# Patient Record
Sex: Female | Born: 1935 | Race: White | Hispanic: No | Smoking: Never smoker
Health system: Southern US, Community
[De-identification: ages and names within clinical notes are randomized; demographics above are authoritative.]

## PROBLEM LIST (undated history)

## (undated) DIAGNOSIS — T7840XA Allergy, unspecified, initial encounter: Secondary | ICD-10-CM

## (undated) DIAGNOSIS — N3090 Cystitis, unspecified without hematuria: Secondary | ICD-10-CM

## (undated) DIAGNOSIS — C801 Malignant (primary) neoplasm, unspecified: Secondary | ICD-10-CM

## (undated) DIAGNOSIS — R112 Nausea with vomiting, unspecified: Secondary | ICD-10-CM

## (undated) DIAGNOSIS — N189 Chronic kidney disease, unspecified: Secondary | ICD-10-CM

## (undated) DIAGNOSIS — Z9889 Other specified postprocedural states: Secondary | ICD-10-CM

## (undated) DIAGNOSIS — J45909 Unspecified asthma, uncomplicated: Secondary | ICD-10-CM

## (undated) DIAGNOSIS — N6019 Diffuse cystic mastopathy of unspecified breast: Secondary | ICD-10-CM

## (undated) DIAGNOSIS — U071 COVID-19: Secondary | ICD-10-CM

## (undated) DIAGNOSIS — M1990 Unspecified osteoarthritis, unspecified site: Secondary | ICD-10-CM

## (undated) DIAGNOSIS — Z86718 Personal history of other venous thrombosis and embolism: Secondary | ICD-10-CM

## (undated) HISTORY — DX: COVID-19: U07.1

## (undated) HISTORY — PX: LUNG SURGERY: SHX703

## (undated) HISTORY — PX: BREAST SURGERY: SHX581

## (undated) HISTORY — PX: OTHER SURGICAL HISTORY: SHX169

## (undated) HISTORY — PX: BACK SURGERY: SHX140

## (undated) HISTORY — PX: HAND SURGERY: SHX662

## (undated) HISTORY — PX: EYE SURGERY: SHX253

## (undated) HISTORY — PX: APPENDECTOMY: SHX54

## (undated) HISTORY — PX: DILATION AND CURETTAGE OF UTERUS: SHX78

## (undated) HISTORY — PX: ABDOMINAL HYSTERECTOMY: SHX81

---

## 2001-09-03 ENCOUNTER — Encounter: Payer: Self-pay | Admitting: Thoracic Surgery

## 2001-09-03 ENCOUNTER — Encounter: Admission: RE | Admit: 2001-09-03 | Discharge: 2001-09-03 | Payer: Self-pay | Admitting: Thoracic Surgery

## 2002-01-23 ENCOUNTER — Encounter: Admission: RE | Admit: 2002-01-23 | Discharge: 2002-01-23 | Payer: Self-pay | Admitting: Gastroenterology

## 2002-01-23 ENCOUNTER — Encounter: Payer: Self-pay | Admitting: Gastroenterology

## 2002-03-10 ENCOUNTER — Encounter: Admission: RE | Admit: 2002-03-10 | Discharge: 2002-03-10 | Payer: Self-pay | Admitting: Gastroenterology

## 2002-03-10 ENCOUNTER — Encounter: Payer: Self-pay | Admitting: Gastroenterology

## 2002-03-26 ENCOUNTER — Encounter: Admission: RE | Admit: 2002-03-26 | Discharge: 2002-03-26 | Payer: Self-pay | Admitting: Gastroenterology

## 2002-03-26 ENCOUNTER — Encounter: Payer: Self-pay | Admitting: Gastroenterology

## 2002-05-26 ENCOUNTER — Encounter: Payer: Self-pay | Admitting: Thoracic Surgery

## 2002-05-28 ENCOUNTER — Encounter: Payer: Self-pay | Admitting: Thoracic Surgery

## 2002-05-28 ENCOUNTER — Inpatient Hospital Stay (HOSPITAL_COMMUNITY): Admission: RE | Admit: 2002-05-28 | Discharge: 2002-05-31 | Payer: Self-pay | Admitting: Thoracic Surgery

## 2002-05-29 ENCOUNTER — Encounter: Payer: Self-pay | Admitting: Thoracic Surgery

## 2002-05-30 ENCOUNTER — Encounter: Payer: Self-pay | Admitting: Thoracic Surgery

## 2002-06-03 ENCOUNTER — Encounter: Admission: RE | Admit: 2002-06-03 | Discharge: 2002-06-03 | Payer: Self-pay | Admitting: Thoracic Surgery

## 2002-06-03 ENCOUNTER — Encounter: Payer: Self-pay | Admitting: Thoracic Surgery

## 2002-07-02 ENCOUNTER — Encounter: Payer: Self-pay | Admitting: Thoracic Surgery

## 2002-07-02 ENCOUNTER — Encounter: Admission: RE | Admit: 2002-07-02 | Discharge: 2002-07-02 | Payer: Self-pay | Admitting: Thoracic Surgery

## 2002-07-30 ENCOUNTER — Encounter: Payer: Self-pay | Admitting: Thoracic Surgery

## 2002-07-30 ENCOUNTER — Encounter: Admission: RE | Admit: 2002-07-30 | Discharge: 2002-07-30 | Payer: Self-pay | Admitting: Thoracic Surgery

## 2002-12-01 ENCOUNTER — Encounter: Admission: RE | Admit: 2002-12-01 | Discharge: 2002-12-01 | Payer: Self-pay | Admitting: Thoracic Surgery

## 2002-12-01 ENCOUNTER — Encounter: Payer: Self-pay | Admitting: Thoracic Surgery

## 2002-12-07 ENCOUNTER — Encounter: Admission: RE | Admit: 2002-12-07 | Discharge: 2002-12-07 | Payer: Self-pay | Admitting: Orthopedic Surgery

## 2002-12-07 ENCOUNTER — Encounter: Payer: Self-pay | Admitting: Orthopedic Surgery

## 2003-06-01 ENCOUNTER — Encounter: Payer: Self-pay | Admitting: Thoracic Surgery

## 2003-06-01 ENCOUNTER — Encounter: Admission: RE | Admit: 2003-06-01 | Discharge: 2003-06-01 | Payer: Self-pay | Admitting: Thoracic Surgery

## 2003-12-08 ENCOUNTER — Encounter: Admission: RE | Admit: 2003-12-08 | Discharge: 2003-12-08 | Payer: Self-pay | Admitting: Thoracic Surgery

## 2004-10-10 ENCOUNTER — Encounter: Admission: RE | Admit: 2004-10-10 | Discharge: 2004-10-10 | Payer: Self-pay | Admitting: Orthopedic Surgery

## 2012-06-26 ENCOUNTER — Other Ambulatory Visit: Payer: Self-pay | Admitting: Dermatology

## 2012-07-31 ENCOUNTER — Other Ambulatory Visit: Payer: Self-pay | Admitting: Dermatology

## 2014-05-18 DIAGNOSIS — K5730 Diverticulosis of large intestine without perforation or abscess without bleeding: Secondary | ICD-10-CM | POA: Insufficient documentation

## 2014-05-18 DIAGNOSIS — E507 Other ocular manifestations of vitamin A deficiency: Secondary | ICD-10-CM | POA: Insufficient documentation

## 2014-05-18 DIAGNOSIS — E559 Vitamin D deficiency, unspecified: Secondary | ICD-10-CM | POA: Insufficient documentation

## 2014-05-18 DIAGNOSIS — E782 Mixed hyperlipidemia: Secondary | ICD-10-CM | POA: Insufficient documentation

## 2014-05-18 DIAGNOSIS — M1711 Unilateral primary osteoarthritis, right knee: Secondary | ICD-10-CM | POA: Insufficient documentation

## 2014-05-18 DIAGNOSIS — M810 Age-related osteoporosis without current pathological fracture: Secondary | ICD-10-CM | POA: Insufficient documentation

## 2014-07-14 DIAGNOSIS — R3129 Other microscopic hematuria: Secondary | ICD-10-CM | POA: Insufficient documentation

## 2014-07-14 DIAGNOSIS — R079 Chest pain, unspecified: Secondary | ICD-10-CM | POA: Insufficient documentation

## 2014-07-14 DIAGNOSIS — N952 Postmenopausal atrophic vaginitis: Secondary | ICD-10-CM | POA: Insufficient documentation

## 2014-07-14 DIAGNOSIS — J309 Allergic rhinitis, unspecified: Secondary | ICD-10-CM | POA: Insufficient documentation

## 2014-07-14 DIAGNOSIS — Z853 Personal history of malignant neoplasm of breast: Secondary | ICD-10-CM | POA: Insufficient documentation

## 2014-07-14 DIAGNOSIS — R928 Other abnormal and inconclusive findings on diagnostic imaging of breast: Secondary | ICD-10-CM | POA: Insufficient documentation

## 2014-07-14 DIAGNOSIS — M47812 Spondylosis without myelopathy or radiculopathy, cervical region: Secondary | ICD-10-CM | POA: Insufficient documentation

## 2014-07-14 DIAGNOSIS — L719 Rosacea, unspecified: Secondary | ICD-10-CM | POA: Insufficient documentation

## 2014-12-06 ENCOUNTER — Other Ambulatory Visit: Payer: Self-pay | Admitting: Surgical

## 2014-12-28 ENCOUNTER — Ambulatory Visit (HOSPITAL_COMMUNITY)
Admission: RE | Admit: 2014-12-28 | Discharge: 2014-12-28 | Disposition: A | Discharge status: Home / Self Care | Payer: Medicare Other | Source: Ambulatory Visit | Attending: Surgical | Admitting: Surgical

## 2014-12-28 ENCOUNTER — Encounter (HOSPITAL_COMMUNITY)
Admission: RE | Admit: 2014-12-28 | Discharge: 2014-12-28 | Disposition: A | Discharge status: Home / Self Care | Payer: Medicare Other | Source: Ambulatory Visit | Attending: Orthopedic Surgery | Admitting: Orthopedic Surgery

## 2014-12-28 ENCOUNTER — Encounter (HOSPITAL_COMMUNITY): Payer: Self-pay

## 2014-12-28 ENCOUNTER — Inpatient Hospital Stay (HOSPITAL_COMMUNITY): Admission: RE | Admit: 2014-12-28 | Payer: Self-pay | Source: Ambulatory Visit

## 2014-12-28 DIAGNOSIS — C50912 Malignant neoplasm of unspecified site of left female breast: Secondary | ICD-10-CM | POA: Diagnosis not present

## 2014-12-28 DIAGNOSIS — Z01818 Encounter for other preprocedural examination: Secondary | ICD-10-CM

## 2014-12-28 DIAGNOSIS — J45909 Unspecified asthma, uncomplicated: Secondary | ICD-10-CM | POA: Insufficient documentation

## 2014-12-28 DIAGNOSIS — C50911 Malignant neoplasm of unspecified site of right female breast: Secondary | ICD-10-CM | POA: Insufficient documentation

## 2014-12-28 HISTORY — DX: Personal history of other venous thrombosis and embolism: Z86.718

## 2014-12-28 HISTORY — DX: Other specified postprocedural states: Z98.890

## 2014-12-28 HISTORY — DX: Allergy, unspecified, initial encounter: T78.40XA

## 2014-12-28 HISTORY — DX: Cystitis, unspecified without hematuria: N30.90

## 2014-12-28 HISTORY — DX: Diffuse cystic mastopathy of unspecified breast: N60.19

## 2014-12-28 HISTORY — DX: Malignant (primary) neoplasm, unspecified: C80.1

## 2014-12-28 HISTORY — DX: Nausea with vomiting, unspecified: R11.2

## 2014-12-28 HISTORY — DX: Chronic kidney disease, unspecified: N18.9

## 2014-12-28 HISTORY — DX: Unspecified asthma, uncomplicated: J45.909

## 2014-12-28 HISTORY — DX: Unspecified osteoarthritis, unspecified site: M19.90

## 2014-12-28 LAB — CBC WITH DIFFERENTIAL/PLATELET
Basophils Absolute: 0 10*3/uL (ref 0.0–0.1)
Basophils Relative: 1 % (ref 0–1)
Eosinophils Absolute: 0.1 10*3/uL (ref 0.0–0.7)
Eosinophils Relative: 1 % (ref 0–5)
HCT: 39 % (ref 36.0–46.0)
Hemoglobin: 12.1 g/dL (ref 12.0–15.0)
Lymphocytes Relative: 40 % (ref 12–46)
Lymphs Abs: 2.2 10*3/uL (ref 0.7–4.0)
MCH: 26.7 pg (ref 26.0–34.0)
MCHC: 31 g/dL (ref 30.0–36.0)
MCV: 86.1 fL (ref 78.0–100.0)
Monocytes Absolute: 0.5 10*3/uL (ref 0.1–1.0)
Monocytes Relative: 8 % (ref 3–12)
Neutro Abs: 2.8 10*3/uL (ref 1.7–7.7)
Neutrophils Relative %: 50 % (ref 43–77)
Platelets: 306 10*3/uL (ref 150–400)
RBC: 4.53 MIL/uL (ref 3.87–5.11)
RDW: 13.2 % (ref 11.5–15.5)
WBC: 5.5 10*3/uL (ref 4.0–10.5)

## 2014-12-28 LAB — COMPREHENSIVE METABOLIC PANEL
ALT: 15 U/L (ref 14–54)
AST: 19 U/L (ref 15–41)
Albumin: 3.6 g/dL (ref 3.5–5.0)
Alkaline Phosphatase: 97 U/L (ref 38–126)
Anion gap: 8 (ref 5–15)
BUN: 16 mg/dL (ref 6–20)
CO2: 30 mmol/L (ref 22–32)
Calcium: 9.6 mg/dL (ref 8.9–10.3)
Chloride: 105 mmol/L (ref 101–111)
Creatinine, Ser: 0.58 mg/dL (ref 0.44–1.00)
GFR calc Af Amer: >60 mL/min (ref >60)
GFR calc non Af Amer: >60 mL/min (ref >60)
Glucose, Bld: 90 mg/dL (ref 70–99)
Potassium: 4 mmol/L (ref 3.5–5.1)
Sodium: 143 mmol/L (ref 135–145)
Total Bilirubin: 0.5 mg/dL (ref 0.3–1.2)
Total Protein: 7.2 g/dL (ref 6.5–8.1)

## 2014-12-28 LAB — URINALYSIS, ROUTINE W REFLEX MICROSCOPIC
Bilirubin Urine: NEGATIVE
Glucose, UA: NEGATIVE mg/dL
Hgb urine dipstick: NEGATIVE
Ketones, ur: NEGATIVE mg/dL
Leukocytes, UA: NEGATIVE
Nitrite: NEGATIVE
Protein, ur: NEGATIVE mg/dL
Specific Gravity, Urine: 1.018 (ref 1.005–1.030)
Urobilinogen, UA: 1 mg/dL (ref 0.0–1.0)
pH: 6 (ref 5.0–8.0)

## 2014-12-28 LAB — PROTIME-INR
INR: 1.09 (ref 0.00–1.49)
Prothrombin Time: 14.3 seconds (ref 11.6–15.2)

## 2014-12-28 LAB — SURGICAL PCR SCREEN
MRSA, PCR: NEGATIVE
STAPHYLOCOCCUS AUREUS: NEGATIVE

## 2014-12-28 LAB — APTT: APTT: 34 s (ref 24–37)

## 2014-12-28 NOTE — Patient Instructions (Signed)
22 Anna [name]  12/28/2014   Your procedure is scheduled on:      Wednesday Jan 05, 2015   Report to [facility] Main Entrance and follow signs to  [place] arrive at 9:30 AM.   Call this number if you have problems the morning of surgery [phone] or Presurgical Testing [phone].   Remember:  Do not eat food or drink liquids :After Midnight.  For Living Will and/or Health Care Power Attorney Forms: please provide copy for your medical record, may bring AM of surgery (forms should be already notarized-we do not provide this service).      Take these medicines the morning of surgery with A SIP OF WATER: Restasis eye gtts as needed                               You may not have any metal on your body including hair pins and piercings  Do not wear jewelry, make-up, lotions, powders, perfumes, nail polish or deodorant.  Do not shave body hair  48 hours(2 days) of CHG soap use.                Do not bring valuables to the hospital. [place].  Contacts, dentures or bridgework may not be worn into surgery.  Leave suitcase in the car. After surgery it may be brought to your room.  For patients admitted to the hospital, checkout time is 11:00 AM the day of discharge.     Special Instructions: review fact sheets for MRSA information, Blood Transfusion fact sheet, Incentive Spirometry. ________________________________________________________________________  [facility] - Preparing for Surgery Before surgery, you can play an important role.  Because skin is not sterile, your skin needs to be as free of germs as possible.  You can reduce the number of germs on your skin by washing with CHG (chlorahexidine gluconate) soap before surgery.  CHG is an antiseptic cleaner which kills germs and bonds with the skin to continue killing germs even after washing. Please DO NOT use if you have an allergy to CHG or antibacterial soaps.  If your  skin becomes reddened/irritated stop using the CHG and inform your nurse when you arrive at Short Stay. Do not shave (including legs and underarms) for at least 48 hours prior to the first CHG shower.  You may shave your face/neck. Please follow these instructions carefully:  1.  Shower with CHG Soap the night before surgery and the  morning of Surgery.  2.  If you choose to wash your hair, wash your hair first as usual with your  normal  shampoo.  3.  After you shampoo, rinse your hair and body thoroughly to remove the  shampoo.                           4.  Use CHG as you would any other liquid soap.  You can apply chg directly  to the skin and wash                       Gently with a scrungie or clean washcloth.  5.  Apply the CHG Soap to your body ONLY FROM THE NECK DOWN.   Do not use on face/ open  Wound or open sores. Avoid contact with eyes, ears mouth and genitals (private parts).                       Wash face,  Genitals (private parts) with your normal soap.             6.  Wash thoroughly, paying special attention to the area where your surgery  will be performed.  7.  Thoroughly rinse your body with warm water from the neck down.  8.  DO NOT shower/wash with your normal soap after using and rinsing off  the CHG Soap.                9.  Pat yourself dry with a clean towel.            10.  Wear clean pajamas.            11.  Place clean sheets on your bed the night of your first shower and do not  sleep with pets. Day of Surgery : Do not apply any lotions/deodorants the morning of surgery.  Please wear clean clothes to the hospital/surgery center.  FAILURE TO FOLLOW THESE INSTRUCTIONS MAY RESULT IN THE CANCELLATION OF YOUR SURGERY PATIENT SIGNATURE_________________________________  NURSE SIGNATURE__________________________________  ________________________________________________________________________   Adam Phenix  An incentive spirometer  is a tool that can help keep your lungs clear and active. This tool measures how well you are filling your lungs with each breath. Taking long deep breaths may help reverse or decrease the chance of developing breathing (pulmonary) problems (especially infection) following:  A long period of time when you are unable to move or be active. BEFORE THE PROCEDURE   If the spirometer includes an indicator to show your best effort, your nurse or respiratory therapist will set it to a desired goal.  If possible, sit up straight or lean slightly forward. Try not to slouch.  Hold the incentive spirometer in an upright position. INSTRUCTIONS FOR USE   Sit on the edge of your bed if possible, or sit up as far as you can in bed or on a chair.  Hold the incentive spirometer in an upright position.  Breathe out normally.  Place the mouthpiece in your mouth and seal your lips tightly around it.  Breathe in slowly and as deeply as possible, raising the piston or the ball toward the top of the column.  Hold your breath for 3-5 seconds or for as long as possible. Allow the piston or ball to fall to the bottom of the column.  Remove the mouthpiece from your mouth and breathe out normally.  Rest for a few seconds and repeat Steps 1 through 7 at least 10 times every 1-2 hours when you are awake. Take your time and take a few normal breaths between deep breaths.  The spirometer may include an indicator to show your best effort. Use the indicator as a goal to work toward during each repetition.  After each set of 10 deep breaths, practice coughing to be sure your lungs are clear. If you have an incision (the cut made at the time of surgery), support your incision when coughing by placing a pillow or rolled up towels firmly against it. Once you are able to get out of bed, walk around indoors and cough well. You may stop using the incentive spirometer when instructed by your caregiver.  RISKS AND  COMPLICATIONS  Take your time so you do not  get dizzy or light-headed.  If you are in pain, you may need to take or ask for pain medication before doing incentive spirometry. It is harder to take a deep breath if you are having pain. AFTER USE  Rest and breathe slowly and easily.  It can be helpful to keep track of a log of your progress. Your caregiver can provide you with a simple table to help with this. If you are using the spirometer at home, follow these instructions: Comfort IF:   You are having difficultly using the spirometer.  You have trouble using the spirometer as often as instructed.  Your pain medication is not giving enough relief while using the spirometer.  You develop fever of 100.5 F (38.1 C) or higher. SEEK IMMEDIATE MEDICAL CARE IF:   You cough up bloody sputum that had not been present before.  You develop fever of 102 F (38.9 C) or greater.  You develop worsening pain at or near the incision site. MAKE SURE YOU:   Understand these instructions.  Will watch your condition.  Will get help right away if you are not doing well or get worse. Document Released: 12/24/2006 Document Revised: 11/05/2011 Document Reviewed: 02/24/2007 ExitCare Patient Information 2014 Exit[employer], [place].   ________________________________________________________________________  WHAT IS A BLOOD TRANSFUSION? Blood Transfusion Information  A transfusion is the replacement of blood or some of its parts. Blood is made up of multiple cells which provide different functions.  Red blood cells carry oxygen and are used for blood loss replacement.  White blood cells fight against infection.  Platelets control bleeding.  Plasma helps clot blood.  Other blood products are available for specialized needs, such as hemophilia or other clotting disorders. BEFORE THE TRANSFUSION  Who gives blood for transfusions?   Healthy volunteers who are fully evaluated to make sure  their blood is safe. This is blood bank blood. Transfusion therapy is the safest it has ever been in the practice of medicine. Before blood is taken from a donor, a complete history is taken to make sure that person has no history of diseases nor engages in risky social behavior (examples are intravenous drug use or sexual activity with multiple partners). The donor's travel history is screened to minimize risk of transmitting infections, such as malaria. The donated blood is tested for signs of infectious diseases, such as HIV and hepatitis. The blood is then tested to be sure it is compatible with you in order to minimize the chance of a transfusion reaction. If you or a relative donates blood, this is often done in anticipation of surgery and is not appropriate for emergency situations. It takes many days to process the donated blood. RISKS AND COMPLICATIONS Although transfusion therapy is very safe and saves many lives, the main dangers of transfusion include:   Getting an infectious disease.  Developing a transfusion reaction. This is an allergic reaction to something in the blood you were given. Every precaution is taken to prevent this. The decision to have a blood transfusion has been considered carefully by your caregiver before blood is given. Blood is not given unless the benefits outweigh the risks. AFTER THE TRANSFUSION  Right after receiving a blood transfusion, you will usually feel much better and more energetic. This is especially true if your red blood cells have gotten low (anemic). The transfusion raises the level of the red blood cells which carry oxygen, and this usually causes an energy increase.  The nurse administering the transfusion will  monitor you carefully for complications. HOME CARE INSTRUCTIONS  No special instructions are needed after a transfusion. You may find your energy is better. Speak with your caregiver about any limitations on activity for underlying diseases  you may have. SEEK MEDICAL CARE IF:   Your condition is not improving after your transfusion.  You develop redness or irritation at the intravenous (IV) site. SEEK IMMEDIATE MEDICAL CARE IF:  Any of the following symptoms occur over the next 12 hours:  Shaking chills.  You have a temperature by mouth above 102 F (38.9 C), not controlled by medicine.  Chest, back, or muscle pain.  People around you feel you are not acting correctly or are confused.  Shortness of breath or difficulty breathing.  Dizziness and fainting.  You get a rash or develop hives.  You have a decrease in urine output.  Your urine turns a dark color or changes to pink, red, or brown. Any of the following symptoms occur over the next 10 days:  You have a temperature by mouth above 102 F (38.9 C), not controlled by medicine.  Shortness of breath.  Weakness after normal activity.  The white part of the eye turns yellow (jaundice).  You have a decrease in the amount of urine or are urinating less often.  Your urine turns a dark color or changes to pink, red, or brown. Document Released: 08/10/2000 Document Revised: 11/05/2011 Document Reviewed: 03/29/2008 [facility] Patient Information 2014 [place], [place].  _______________________________________________________________________

## 2014-12-28 NOTE — Progress Notes (Addendum)
Pt to be seen by [facility] on 12/29/2014 related to recent spells of feeling as if she is going to black out. Pt to have holter monitor placed. Pt states she saw Dr Charlestine Night this am and MD office aware of placement of monitor for tomorrow.

## 2014-12-29 NOTE — H&P (Signed)
TOTAL KNEE ADMISSION H&P  Patient is being admitted for right total knee arthroplasty.  Subjective:  Chief Complaint:right knee pain.  HPI: Anna [name], 81 y.o. female, has a history of pain and functional disability in the right knee due to arthritis and has failed non-surgical conservative treatments for greater than 12 weeks to includeNSAID's and/or analgesics, corticosteriod injections, viscosupplementation injections, use of assistive devices and activity modification.  Onset of symptoms was gradual, starting 8 years ago with gradually worsening course since that time. The patient noted prior procedures on the knee to include  arthroscopy and menisectomy on the right knee(s).  Patient currently rates pain in the right knee(s) at 9 out of 10 with activity. Patient has night pain, worsening of pain with activity and weight bearing, pain that interferes with activities of daily living, pain with passive range of motion, crepitus and joint swelling.  Patient has evidence of periarticular osteophytes and joint space narrowing by imaging studies. There is no active infection.  Past Medical History  Diagnosis Date  . PONV (postoperative nausea and vomiting)     pt states stays very sleepy for days following anesthesia   . Asthma     hx of in childhood   . Allergy     seasonal also with dogs and cats  . Chronic kidney disease     hx of kidney stones last one pasted 1 month ago   . Bladder infection     hx of   . Arthritis   . Cancer     breast cancer bilat has had double mastectomy   . H/O blood clots     during delivery occurred 50 years ago per left arm   . Fibrocystic breast disease     Past Surgical History  Procedure Laterality Date  . Cesarean section      times 2  . Abdominal hysterectomy    . Dilation and curettage of uterus    . Appendectomy    . Eye surgery      bilat cataract surgery   . Breast surgery      double mastectomy   . Back surgery      disc repaired 20  years ago   . Colonscopy     . Hand surgery      left hand / joint issues with thumb   . 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 Cyst repaired       following mammogram / cyst ruptured had to surgically repair area / left side   . Right knee arthroscopy         Current outpatient prescriptions:  .  aspirin EC 81 MG tablet, Take 81 mg by mouth daily., Disp: , Rfl:  .  cholecalciferol (VITAMIN D) 1000 UNITS tablet, Take 1,000 Units by mouth daily., Disp: , Rfl:  .  cycloSPORINE (RESTASIS) 0.05 % ophthalmic emulsion, Place 1 drop into both eyes 2 (two) times daily., Disp: , Rfl:  .  ibuprofen (ADVIL,MOTRIN) 200 MG tablet, Take 200 mg by mouth every 6 (six) hours as needed for mild pain., Disp: , Rfl:  .  Omega-3 Fatty Acids (OMEGA 3 PO), Take 1 capsule by mouth daily., Disp: , Rfl:   Allergies  Allergen Reactions  . Demerol [Meperidine] Nausea And Vomiting  . Sulfa Antibiotics Nausea And Vomiting    History  Substance Use Topics  . Smoking status: Never Smoker   .  Smokeless tobacco: Never Used  . Alcohol Use: No      Review of Systems  Constitutional: Negative.   HENT: Negative.   Eyes: Positive for pain. Negative for blurred vision, double vision, photophobia, discharge and redness.       Discomfort from dry eyes  Respiratory: Negative.   Cardiovascular: Negative.   Gastrointestinal: Negative.   Genitourinary: Negative.   Musculoskeletal: Positive for joint pain. Negative for myalgias, back pain, falls and neck pain.       Right knee pain  Skin: Negative.   Neurological: Positive for dizziness. Negative for tingling, tremors, sensory change, speech change, focal weakness, seizures and loss of consciousness.  Endo/Heme/Allergies: Negative.   Psychiatric/Behavioral: Negative.     Objective:  Physical Exam  Constitutional: She is oriented to person, place, and time. She appears well-developed and well-nourished. No distress.  HENT:   Head: Normocephalic and atraumatic.  Right Ear: External ear normal.  Left Ear: External ear normal.  Nose: Nose normal.  Mouth/Throat: Oropharynx is clear and moist.  Eyes: Conjunctivae and EOM are normal.  Neck: Normal range of motion. Neck supple.  Cardiovascular: Normal rate, regular rhythm, normal heart sounds and intact distal pulses.   No murmur heard. Respiratory: Effort normal and breath sounds normal. No respiratory distress. She has no wheezes.  GI: Bowel sounds are normal. She exhibits no distension. There is no tenderness.  Musculoskeletal:       Right hip: Normal.       Left hip: Normal.       Right knee: She exhibits decreased range of motion, swelling and effusion. She exhibits no erythema. Tenderness found. Medial joint line and lateral joint line tenderness noted.       Left knee: Normal.  Neurological: She is alert and oriented to person, place, and time. She has normal strength and normal reflexes. No sensory deficit.  Skin: No rash noted. She is not diaphoretic. No erythema.  Psychiatric: She has a normal mood and affect. Her behavior is normal.    Vital signs in last 24 hours: Temp:  [98.1 F (36.7 C)] 98.1 F (36.7 C) (05/03 1405) Pulse Rate:  [90] 90 (05/03 1405) Resp:  [16] 16 (05/03 1405) BP: (121)/(46) 121/46 mmHg (05/03 1405) SpO2:  [99 %] 99 % (05/03 1405) Weight:  [50.604 kg (111 lb 9 oz)] 50.604 kg (111 lb 9 oz) (05/03 1405)   Imaging Review Plain radiographs demonstrate severe degenerative joint disease of the right knee(s). The overall alignment ismild valgus. The bone quality appears to be fair for age and reported activity level.  Assessment/Plan:  End stage primary osteoarthritis, right knee   The patient history, physical examination, clinical judgment of the provider and imaging studies are consistent with end stage degenerative joint disease of the right knee(s) and total knee arthroplasty is deemed medically necessary. The treatment  options including medical management, injection therapy arthroscopy and arthroplasty were discussed at length. The risks and benefits of total knee arthroplasty were presented and reviewed. The risks due to aseptic loosening, infection, stiffness, patella tracking problems, thromboembolic complications and other imponderables were discussed. The patient acknowledged the explanation, agreed to proceed with the plan and consent was signed. Patient is being admitted for inpatient treatment for surgery, pain control, PT, OT, prophylactic antibiotics, VTE prophylaxis, progressive ambulation and ADL's and discharge planning. The patient is planning to be discharged home with home health services    Topical TXA Will be wearing holter monitor 5/4-5/6 per cardio Norco ONLY; Percocet TOO  strong    [employer], PA-C

## 2015-01-04 NOTE — Progress Notes (Addendum)
Holter Monitor results per chart 5/4-12/31/2014 have requested clearance noted per Dr Jesse Fall. Anesthesia/Dr Vladimir Crofts reviewed. No orders given. Anesthesia to see pt day of surgery.

## 2015-01-05 ENCOUNTER — Inpatient Hospital Stay (HOSPITAL_COMMUNITY): Payer: Medicare Other | Admitting: Anesthesiology

## 2015-01-05 ENCOUNTER — Inpatient Hospital Stay (HOSPITAL_COMMUNITY)
Admission: RE | Admit: 2015-01-05 | Discharge: 2015-01-08 | DRG: 470 | Disposition: A | Discharge status: Home Health | Payer: Medicare Other | Source: Ambulatory Visit | Attending: Orthopedic Surgery | Admitting: Orthopedic Surgery

## 2015-01-05 ENCOUNTER — Encounter (HOSPITAL_COMMUNITY)
Admission: RE | Disposition: A | Discharge status: Home Health | Payer: Self-pay | Source: Ambulatory Visit | Attending: Orthopedic Surgery

## 2015-01-05 ENCOUNTER — Encounter (HOSPITAL_COMMUNITY): Payer: Self-pay

## 2015-01-05 DIAGNOSIS — Z853 Personal history of malignant neoplasm of breast: Secondary | ICD-10-CM | POA: Diagnosis not present

## 2015-01-05 DIAGNOSIS — Z87442 Personal history of urinary calculi: Secondary | ICD-10-CM

## 2015-01-05 DIAGNOSIS — Z7982 Long term (current) use of aspirin: Secondary | ICD-10-CM

## 2015-01-05 DIAGNOSIS — M659 Synovitis and tenosynovitis, unspecified: Secondary | ICD-10-CM | POA: Diagnosis present

## 2015-01-05 DIAGNOSIS — M25561 Pain in right knee: Secondary | ICD-10-CM | POA: Diagnosis present

## 2015-01-05 DIAGNOSIS — M1711 Unilateral primary osteoarthritis, right knee: Secondary | ICD-10-CM | POA: Diagnosis present

## 2015-01-05 DIAGNOSIS — D62 Acute posthemorrhagic anemia: Secondary | ICD-10-CM | POA: Diagnosis not present

## 2015-01-05 DIAGNOSIS — Z96659 Presence of unspecified artificial knee joint: Secondary | ICD-10-CM

## 2015-01-05 HISTORY — PX: TOTAL KNEE ARTHROPLASTY: SHX125

## 2015-01-05 LAB — TYPE AND SCREEN
ABO/RH(D): AB POS
Antibody Screen: NEGATIVE

## 2015-01-05 LAB — ABO/RH: ABO/RH(D): AB POS

## 2015-01-05 SURGERY — ARTHROPLASTY, KNEE, TOTAL
Anesthesia: General | Site: Knee | Laterality: Right

## 2015-01-05 MED ORDER — RIVAROXABAN 10 MG PO TABS
10.0000 mg | ORAL_TABLET | Freq: Every day | ORAL | Status: DC
  Administered 2015-01-06 – 2015-01-08 (×3): 10 mg via ORAL
  Filled 2015-01-05 (×4): qty 1

## 2015-01-05 MED ORDER — ONDANSETRON HCL 4 MG/2ML IJ SOLN
INTRAMUSCULAR | Status: AC
  Filled 2015-01-05: qty 2

## 2015-01-05 MED ORDER — HYDROMORPHONE HCL 1 MG/ML IJ SOLN
INTRAMUSCULAR | Status: AC
  Administered 2015-01-05: 0.25 mg via INTRAVENOUS
  Filled 2015-01-05: qty 1

## 2015-01-05 MED ORDER — CEFAZOLIN SODIUM-DEXTROSE 2-3 GM-% IV SOLR
INTRAVENOUS | Status: AC
  Filled 2015-01-05: qty 50

## 2015-01-05 MED ORDER — METHOCARBAMOL 1000 MG/10ML IJ SOLN
500.0000 mg | Freq: Four times a day (QID) | INTRAMUSCULAR | Status: DC | PRN
  Administered 2015-01-05: 500 mg via INTRAVENOUS
  Filled 2015-01-05 (×2): qty 5

## 2015-01-05 MED ORDER — SODIUM CHLORIDE 0.9 % IR SOLN
Status: DC | PRN
  Administered 2015-01-05: 500 mL

## 2015-01-05 MED ORDER — ACETAMINOPHEN 650 MG RE SUPP: 650.0000 mg | Freq: Four times a day (QID) | RECTAL | Status: DC | PRN

## 2015-01-05 MED ORDER — HYDROMORPHONE HCL 1 MG/ML IJ SOLN
INTRAMUSCULAR | Status: DC | PRN
  Administered 2015-01-05: .2 mg via INTRAVENOUS
  Administered 2015-01-05: .4 mg via INTRAVENOUS

## 2015-01-05 MED ORDER — THROMBIN 5000 UNITS EX SOLR
CUTANEOUS | Status: AC
  Filled 2015-01-05: qty 5000

## 2015-01-05 MED ORDER — NEOSTIGMINE METHYLSULFATE 10 MG/10ML IV SOLN
INTRAVENOUS | Status: AC
  Filled 2015-01-05: qty 1

## 2015-01-05 MED ORDER — SODIUM CHLORIDE 0.9 % IR SOLN
Status: AC
  Filled 2015-01-05: qty 1

## 2015-01-05 MED ORDER — CHLORHEXIDINE GLUCONATE 4 % EX LIQD: 60.0000 mL | Freq: Once | CUTANEOUS | Status: DC

## 2015-01-05 MED ORDER — ALUM & MAG HYDROXIDE-SIMETH 200-200-20 MG/5ML PO SUSP: 30.0000 mL | ORAL | Status: DC | PRN

## 2015-01-05 MED ORDER — LACTATED RINGERS IV SOLN
INTRAVENOUS | Status: DC
  Administered 2015-01-06: 16:00:00 via INTRAVENOUS

## 2015-01-05 MED ORDER — SODIUM CHLORIDE 0.9 % IJ SOLN
INTRAMUSCULAR | Status: DC | PRN
  Administered 2015-01-05: 20 mL

## 2015-01-05 MED ORDER — HYDROCODONE-ACETAMINOPHEN 5-325 MG PO TABS
1.0000 | ORAL_TABLET | ORAL | Status: DC | PRN
  Administered 2015-01-05 – 2015-01-06 (×3): 1 via ORAL
  Administered 2015-01-06: 2 via ORAL
  Filled 2015-01-05: qty 1
  Filled 2015-01-05: qty 2
  Filled 2015-01-05 (×2): qty 1

## 2015-01-05 MED ORDER — ONDANSETRON HCL 4 MG/2ML IJ SOLN
4.0000 mg | Freq: Four times a day (QID) | INTRAMUSCULAR | Status: DC | PRN
  Administered 2015-01-06 – 2015-01-07 (×3): 4 mg via INTRAVENOUS
  Filled 2015-01-05 (×3): qty 2

## 2015-01-05 MED ORDER — GLYCOPYRROLATE 0.2 MG/ML IJ SOLN
INTRAMUSCULAR | Status: DC | PRN
  Administered 2015-01-05: 0.2 mg via INTRAVENOUS

## 2015-01-05 MED ORDER — HYDROMORPHONE HCL 1 MG/ML IJ SOLN
INTRAMUSCULAR | Status: AC
  Filled 2015-01-05: qty 1

## 2015-01-05 MED ORDER — CYCLOSPORINE 0.05 % OP EMUL
1.0000 [drp] | Freq: Two times a day (BID) | OPHTHALMIC | Status: DC
  Administered 2015-01-05 – 2015-01-08 (×6): 1 [drp] via OPHTHALMIC
  Filled 2015-01-05 (×7): qty 1

## 2015-01-05 MED ORDER — MENTHOL 3 MG MT LOZG: 1.0000 | LOZENGE | OROMUCOSAL | Status: DC | PRN

## 2015-01-05 MED ORDER — ACETAMINOPHEN 325 MG PO TABS
650.0000 mg | ORAL_TABLET | Freq: Four times a day (QID) | ORAL | Status: DC | PRN
  Administered 2015-01-07 – 2015-01-08 (×4): 650 mg via ORAL
  Filled 2015-01-05 (×5): qty 2

## 2015-01-05 MED ORDER — BUPIVACAINE LIPOSOME 1.3 % IJ SUSP
20.0000 mL | Freq: Once | INTRAMUSCULAR | Status: AC
  Administered 2015-01-05: 20 mL
  Filled 2015-01-05: qty 20

## 2015-01-05 MED ORDER — 0.9 % SODIUM CHLORIDE (POUR BTL) OPTIME
TOPICAL | Status: DC | PRN
  Administered 2015-01-05: 1000 mL

## 2015-01-05 MED ORDER — LACTATED RINGERS IV SOLN
INTRAVENOUS | Status: DC
  Administered 2015-01-05 (×2): via INTRAVENOUS

## 2015-01-05 MED ORDER — CEFAZOLIN SODIUM-DEXTROSE 2-3 GM-% IV SOLR
2.0000 g | INTRAVENOUS | Status: AC
  Administered 2015-01-05: 2 g via INTRAVENOUS

## 2015-01-05 MED ORDER — TRANEXAMIC ACID 1000 MG/10ML IV SOLN
2000.0000 mg | INTRAVENOUS | Status: DC | PRN
  Administered 2015-01-05: 2000 mg via TOPICAL

## 2015-01-05 MED ORDER — ACETAMINOPHEN 10 MG/ML IV SOLN
1000.0000 mg | Freq: Once | INTRAVENOUS | Status: AC
  Administered 2015-01-05: 1000 mg via INTRAVENOUS
  Filled 2015-01-05: qty 100

## 2015-01-05 MED ORDER — HYDROMORPHONE HCL 1 MG/ML IJ SOLN
0.2500 mg | INTRAMUSCULAR | Status: DC | PRN
  Administered 2015-01-05 (×2): 0.25 mg via INTRAVENOUS
  Administered 2015-01-05 (×3): 0.5 mg via INTRAVENOUS

## 2015-01-05 MED ORDER — ONDANSETRON HCL 4 MG/2ML IJ SOLN
INTRAMUSCULAR | Status: DC | PRN
  Administered 2015-01-05: 4 mg via INTRAVENOUS

## 2015-01-05 MED ORDER — EPHEDRINE SULFATE 50 MG/ML IJ SOLN
INTRAMUSCULAR | Status: DC | PRN
  Administered 2015-01-05 (×2): 5 mg via INTRAVENOUS

## 2015-01-05 MED ORDER — FENTANYL CITRATE (PF) 100 MCG/2ML IJ SOLN
INTRAMUSCULAR | Status: DC | PRN
  Administered 2015-01-05 (×2): 50 ug via INTRAVENOUS
  Administered 2015-01-05: 100 ug via INTRAVENOUS
  Administered 2015-01-05: 50 ug via INTRAVENOUS

## 2015-01-05 MED ORDER — LIDOCAINE HCL (CARDIAC) 20 MG/ML IV SOLN
INTRAVENOUS | Status: DC | PRN
  Administered 2015-01-05: 50 mg via INTRAVENOUS

## 2015-01-05 MED ORDER — ONDANSETRON HCL 4 MG PO TABS: 4.0000 mg | ORAL_TABLET | Freq: Four times a day (QID) | ORAL | Status: DC | PRN

## 2015-01-05 MED ORDER — OXYCODONE-ACETAMINOPHEN 5-325 MG PO TABS
1.0000 | ORAL_TABLET | ORAL | Status: DC | PRN
  Administered 2015-01-06 – 2015-01-07 (×6): 1 via ORAL
  Filled 2015-01-05: qty 2
  Filled 2015-01-05 (×6): qty 1

## 2015-01-05 MED ORDER — ROCURONIUM BROMIDE 100 MG/10ML IV SOLN
INTRAVENOUS | Status: DC | PRN
  Administered 2015-01-05: 40 mg via INTRAVENOUS

## 2015-01-05 MED ORDER — NEOSTIGMINE METHYLSULFATE 10 MG/10ML IV SOLN
INTRAVENOUS | Status: DC | PRN
  Administered 2015-01-05: 2 mg via INTRAVENOUS

## 2015-01-05 MED ORDER — FLEET ENEMA 7-19 GM/118ML RE ENEM: 1.0000 | ENEMA | Freq: Once | RECTAL | Status: AC | PRN

## 2015-01-05 MED ORDER — CEFAZOLIN SODIUM 1-5 GM-% IV SOLN
1.0000 g | Freq: Four times a day (QID) | INTRAVENOUS | Status: AC
  Administered 2015-01-05 (×2): 1 g via INTRAVENOUS
  Filled 2015-01-05 (×2): qty 50

## 2015-01-05 MED ORDER — OXYCODONE-ACETAMINOPHEN 5-325 MG PO TABS: 2.0000 | ORAL_TABLET | ORAL | Status: DC | PRN

## 2015-01-05 MED ORDER — PROPOFOL 10 MG/ML IV BOLUS
INTRAVENOUS | Status: DC | PRN
  Administered 2015-01-05: 130 mg via INTRAVENOUS

## 2015-01-05 MED ORDER — PHENOL 1.4 % MT LIQD
1.0000 | OROMUCOSAL | Status: DC | PRN
  Filled 2015-01-05: qty 177

## 2015-01-05 MED ORDER — FENTANYL CITRATE (PF) 250 MCG/5ML IJ SOLN
INTRAMUSCULAR | Status: AC
  Filled 2015-01-05: qty 5

## 2015-01-05 MED ORDER — HYDROMORPHONE HCL 1 MG/ML IJ SOLN
0.5000 mg | INTRAMUSCULAR | Status: DC | PRN
  Administered 2015-01-06 (×2): 0.5 mg via INTRAVENOUS
  Filled 2015-01-05 (×2): qty 1

## 2015-01-05 MED ORDER — FERROUS SULFATE 325 (65 FE) MG PO TABS
325.0000 mg | ORAL_TABLET | Freq: Three times a day (TID) | ORAL | Status: DC
  Administered 2015-01-06 – 2015-01-08 (×5): 325 mg via ORAL
  Filled 2015-01-05 (×11): qty 1

## 2015-01-05 MED ORDER — BUPIVACAINE HCL (PF) 0.25 % IJ SOLN
INTRAMUSCULAR | Status: AC
  Filled 2015-01-05: qty 30

## 2015-01-05 MED ORDER — SODIUM CHLORIDE 0.9 % IJ SOLN
INTRAMUSCULAR | Status: AC
  Filled 2015-01-05: qty 50

## 2015-01-05 MED ORDER — POLYETHYLENE GLYCOL 3350 17 G PO PACK: 17.0000 g | PACK | Freq: Every day | ORAL | Status: DC | PRN

## 2015-01-05 MED ORDER — BISACODYL 5 MG PO TBEC
5.0000 mg | DELAYED_RELEASE_TABLET | Freq: Every day | ORAL | Status: DC | PRN
  Administered 2015-01-06: 5 mg via ORAL
  Filled 2015-01-05: qty 1

## 2015-01-05 MED ORDER — HYDROMORPHONE HCL 2 MG/ML IJ SOLN
INTRAMUSCULAR | Status: AC
  Filled 2015-01-05: qty 1

## 2015-01-05 MED ORDER — LACTATED RINGERS IV SOLN
INTRAVENOUS | Status: DC
  Administered 2015-01-05: 1000 mL via INTRAVENOUS

## 2015-01-05 MED ORDER — TRANEXAMIC ACID 1000 MG/10ML IV SOLN
2000.0000 mg | Freq: Once | INTRAVENOUS | Status: DC
  Filled 2015-01-05: qty 20

## 2015-01-05 MED ORDER — DEXAMETHASONE SODIUM PHOSPHATE 10 MG/ML IJ SOLN
INTRAMUSCULAR | Status: DC | PRN
  Administered 2015-01-05: 10 mg via INTRAVENOUS

## 2015-01-05 MED ORDER — PROPOFOL 10 MG/ML IV BOLUS
INTRAVENOUS | Status: AC
  Filled 2015-01-05: qty 20

## 2015-01-05 MED ORDER — THROMBIN 5000 UNITS EX SOLR
OROMUCOSAL | Status: DC | PRN
  Administered 2015-01-05: 5 mL via TOPICAL

## 2015-01-05 MED ORDER — LIDOCAINE HCL (CARDIAC) 20 MG/ML IV SOLN
INTRAVENOUS | Status: AC
  Filled 2015-01-05: qty 5

## 2015-01-05 MED ORDER — BUPIVACAINE-EPINEPHRINE 0.5% -1:200000 IJ SOLN
INTRAMUSCULAR | Status: DC | PRN
  Administered 2015-01-05: 20 mL

## 2015-01-05 MED ORDER — GLYCOPYRROLATE 0.2 MG/ML IJ SOLN
INTRAMUSCULAR | Status: AC
  Filled 2015-01-05: qty 3

## 2015-01-05 MED ORDER — METHOCARBAMOL 500 MG PO TABS
500.0000 mg | ORAL_TABLET | Freq: Four times a day (QID) | ORAL | Status: DC | PRN
  Administered 2015-01-06 (×2): 500 mg via ORAL
  Filled 2015-01-05 (×2): qty 1

## 2015-01-05 MED ORDER — ROCURONIUM BROMIDE 100 MG/10ML IV SOLN
INTRAVENOUS | Status: AC
  Filled 2015-01-05: qty 1

## 2015-01-05 SURGICAL SUPPLY — 71 items
BAG DECANTER FOR FLEXI CONT (MISCELLANEOUS) ×3 IMPLANT
BAG ZIPLOCK 12X15 (MISCELLANEOUS) IMPLANT
BANDAGE ELASTIC 4 VELCRO ST LF (GAUZE/BANDAGES/DRESSINGS) ×3 IMPLANT
BANDAGE ELASTIC 6 VELCRO ST LF (GAUZE/BANDAGES/DRESSINGS) ×3 IMPLANT
BANDAGE ESMARK 6X9 LF (GAUZE/BANDAGES/DRESSINGS) ×1 IMPLANT
BLADE SAG 18X100X1.27 (BLADE) ×3 IMPLANT
BLADE SAW SGTL 11.0X1.19X90.0M (BLADE) ×3 IMPLANT
BNDG ESMARK 6X9 LF (GAUZE/BANDAGES/DRESSINGS) ×3
BONE CEMENT GENTAMICIN (Cement) ×6 IMPLANT
CAP KNEE TOTAL 3 SIGMA ×3 IMPLANT
CEMENT BONE GENTAMICIN 40 (Cement) ×2 IMPLANT
CUFF TOURN SGL QUICK 34 (TOURNIQUET CUFF) ×2
CUFF TRNQT CYL 34X4X40X1 (TOURNIQUET CUFF) ×1 IMPLANT
DRAPE EXTREMITY T 121X128X90 (DRAPE) ×3 IMPLANT
DRAPE INCISE IOBAN 66X45 STRL (DRAPES) IMPLANT
DRAPE POUCH INSTRU U-SHP 10X18 (DRAPES) ×3 IMPLANT
DRAPE U-SHAPE 47X51 STRL (DRAPES) ×3 IMPLANT
DRSG AQUACEL AG ADV 3.5X10 (GAUZE/BANDAGES/DRESSINGS) ×3 IMPLANT
DRSG PAD ABDOMINAL 8X10 ST (GAUZE/BANDAGES/DRESSINGS) IMPLANT
DRSG TEGADERM 4X4.75 (GAUZE/BANDAGES/DRESSINGS) IMPLANT
DURAPREP 26ML APPLICATOR (WOUND CARE) ×3 IMPLANT
ELECT REM PT RETURN 9FT ADLT (ELECTROSURGICAL) ×3
ELECTRODE REM PT RTRN 9FT ADLT (ELECTROSURGICAL) ×1 IMPLANT
EVACUATOR 1/8 PVC DRAIN (DRAIN) ×3 IMPLANT
FACESHIELD WRAPAROUND (MASK) ×15 IMPLANT
GAUZE SPONGE 2X2 8PLY STRL LF (GAUZE/BANDAGES/DRESSINGS) IMPLANT
GLOVE BIOGEL PI IND STRL 6.5 (GLOVE) ×1 IMPLANT
GLOVE BIOGEL PI IND STRL 8 (GLOVE) ×1 IMPLANT
GLOVE BIOGEL PI INDICATOR 6.5 (GLOVE) ×2
GLOVE BIOGEL PI INDICATOR 8 (GLOVE) ×2
GLOVE ECLIPSE 8.0 STRL XLNG CF (GLOVE) ×6 IMPLANT
GLOVE SURG SS PI 6.5 STRL IVOR (GLOVE) ×3 IMPLANT
GOWN STRL REUS W/TWL LRG LVL3 (GOWN DISPOSABLE) ×3 IMPLANT
GOWN STRL REUS W/TWL XL LVL3 (GOWN DISPOSABLE) ×3 IMPLANT
HANDPIECE INTERPULSE COAX TIP (DISPOSABLE) ×2
IMMOBILIZER KNEE 20 (SOFTGOODS) ×3 IMPLANT
IMMOBILIZER KNEE 20 THIGH 36 (SOFTGOODS) IMPLANT
KIT BASIN OR (CUSTOM PROCEDURE TRAY) ×3 IMPLANT
LIQUID BAND (GAUZE/BANDAGES/DRESSINGS) ×3 IMPLANT
MANIFOLD NEPTUNE II (INSTRUMENTS) ×3 IMPLANT
NDL SAFETY ECLIPSE 18X1.5 (NEEDLE) IMPLANT
NEEDLE HYPO 18GX1.5 SHARP (NEEDLE)
NEEDLE HYPO 22GX1.5 SAFETY (NEEDLE) ×6 IMPLANT
NS IRRIG 1000ML POUR BTL (IV SOLUTION) IMPLANT
PACK TOTAL JOINT (CUSTOM PROCEDURE TRAY) ×3 IMPLANT
PADDING CAST COTTON 6X4 STRL (CAST SUPPLIES) IMPLANT
PEN SKIN MARKING BROAD (MISCELLANEOUS) ×3 IMPLANT
POSITIONER SURGICAL ARM (MISCELLANEOUS) ×3 IMPLANT
SET HNDPC FAN SPRY TIP SCT (DISPOSABLE) ×1 IMPLANT
SET PAD KNEE POSITIONER (MISCELLANEOUS) ×3 IMPLANT
SPONGE GAUZE 2X2 STER 10/PKG (GAUZE/BANDAGES/DRESSINGS)
SPONGE LAP 18X18 X RAY DECT (DISPOSABLE) IMPLANT
SPONGE SURGIFOAM ABS GEL 100 (HEMOSTASIS) ×3 IMPLANT
STAPLER VISISTAT 35W (STAPLE) IMPLANT
SUCTION FRAZIER 12FR DISP (SUCTIONS) ×3 IMPLANT
SUT BONE WAX W31G (SUTURE) ×3 IMPLANT
SUT MNCRL AB 4-0 PS2 18 (SUTURE) ×3 IMPLANT
SUT VIC AB 1 CT1 27 (SUTURE) ×4
SUT VIC AB 1 CT1 27XBRD ANTBC (SUTURE) ×2 IMPLANT
SUT VIC AB 2-0 CT1 27 (SUTURE) ×6
SUT VIC AB 2-0 CT1 TAPERPNT 27 (SUTURE) ×3 IMPLANT
SUT VLOC 180 0 24IN GS25 (SUTURE) ×3 IMPLANT
SYR 20CC LL (SYRINGE) ×6 IMPLANT
SYR 50ML LL SCALE MARK (SYRINGE) ×3 IMPLANT
TOWEL OR 17X26 10 PK STRL BLUE (TOWEL DISPOSABLE) ×3 IMPLANT
TOWEL OR NON WOVEN STRL DISP B (DISPOSABLE) IMPLANT
TOWER CARTRIDGE SMART MIX (DISPOSABLE) ×3 IMPLANT
TRAY FOLEY W/METER SILVER 14FR (SET/KITS/TRAYS/PACK) ×3 IMPLANT
WATER STERILE IRR 1500ML POUR (IV SOLUTION) ×3 IMPLANT
WRAP KNEE MAXI GEL POST OP (GAUZE/BANDAGES/DRESSINGS) ×3 IMPLANT
YANKAUER SUCT BULB TIP 10FT TU (MISCELLANEOUS) ×3 IMPLANT

## 2015-01-05 NOTE — Brief Op Note (Signed)
01/05/2015  1:11 PM  PATIENT:  Anna [name]  81 y.o. female  PRE-OPERATIVE DIAGNOSIS: Primary OA RIGHT KNEE   POST-OPERATIVE DIAGNOSIS:Primary  OA RIGHT KNEE   PROCEDURE:  Procedure(s): RIGHT TOTAL KNEE ARTHROPLASTY (Right)  SURGEON:  Surgeon(s) and Role:    * Latanya Maudlin, MD - Primary  PHYSICIAN ASSISTANT:Amber Barrera PA   ASSISTANTS: Ardeen Jourdain PA  ANESTHESIA:   general  EBL:  Total I/O In: 1000 [I.V.:1000] Out: 60 [Urine:60]  BLOOD ADMINISTERED:none  DRAINS: (one) Hemovact drain(s) in the Right Knee with  Suction Open   LOCAL MEDICATIONS USED:  MARCAINE 20cc of 0.25% Plain and 20cc of Exparel mixed with 20cc of Normal Saline    SPECIMEN:  No Specimen  DISPOSITION OF SPECIMEN:  N/A  COUNTS:  YES  TOURNIQUET:  * Missing tourniquet times found for documented tourniquets in log:  [id number] *  DICTATION: .Other Dictation: Dictation Number [id number]  PLAN OF CARE: Admit to inpatient   PATIENT DISPOSITION:  Stable in OR   Delay start of Pharmacological VTE agent (>24hrs) due to surgical blood loss or risk of bleeding: yes

## 2015-01-05 NOTE — Anesthesia Postprocedure Evaluation (Signed)
  Anesthesia Post-op Note  Patient: Anna [name]  Procedure(s) Performed: Procedure(s) (LRB): RIGHT TOTAL KNEE ARTHROPLASTY (Right)  Patient Location: PACU  Anesthesia Type: General  Level of Consciousness: awake and alert   Airway and Oxygen Therapy: Patient Spontanous Breathing  Post-op Pain: mild  Post-op Assessment: Post-op Vital signs reviewed, Patient's Cardiovascular Status Stable, Respiratory Function Stable, Patent Airway and No signs of Nausea or vomiting  Last Vitals:  Filed Vitals:   01/05/15 1449  BP: 123/58  Pulse: 100  Temp: 36.4 C  Resp:     Post-op Vital Signs: stable   Complications: No apparent anesthesia complications

## 2015-01-05 NOTE — Progress Notes (Signed)
Patient at this time has been "groggy" and sleeping since admission. I have rounded on the patient, at minimum, once per hour. She has refused to drink any fluids, take anything for nausea, take any pain medication or eat. She has no complaints of nausea or pain at this time either. At the end of shift, she is becoming more alert and talking, but refuses to order anything to eat. VS within normal limits. Foley catheter in place and draining clear yellow urine. Hemovac in place. No s/s of acute distress. Will continue to monitor.

## 2015-01-05 NOTE — Anesthesia Procedure Notes (Signed)
Procedure Name: Intubation Performed by: Glory Buff Pre-anesthesia Checklist: Patient identified, Emergency Drugs available, Suction available, Patient being monitored and Timeout performed Patient Re-evaluated:Patient Re-evaluated prior to inductionOxygen Delivery Method: Circle system utilized Preoxygenation: Pre-oxygenation with 100% oxygen Intubation Type: IV induction Ventilation: Mask ventilation without difficulty and Oral airway inserted - appropriate to patient size Laryngoscope Size: Miller and 2 Grade View: Grade I Tube type: Oral Tube size: 7.0 mm Number of attempts: 1 Airway Equipment and Method: Oral airway and Stylet Placement Confirmation: ETT inserted through vocal cords under direct vision Secured at: 23 cm Tube secured with: Tape Dental Injury: Teeth and Oropharynx as per pre-operative assessment

## 2015-01-05 NOTE — Interval H&P Note (Signed)
History and Physical Interval Note:  01/05/2015 10:56 AM  Anna [name]  has presented today for surgery, with the diagnosis of OA RIGHT KNEE   The various methods of treatment have been discussed with the patient and family. After consideration of risks, benefits and other options for treatment, the patient has consented to  Procedure(s): RIGHT TOTAL KNEE ARTHROPLASTY (Right) as a surgical intervention .  The patient's history has been reviewed, patient examined, no change in status, stable for surgery.  I have reviewed the patient's chart and labs.  Questions were answered to the patient's satisfaction.     [name]

## 2015-01-05 NOTE — Transfer of Care (Signed)
Immediate Anesthesia Transfer of Care Note  Patient: Anna [name]  Procedure(s) Performed: Procedure(s): RIGHT TOTAL KNEE ARTHROPLASTY (Right)  Patient Location: PACU  Anesthesia Type:General  Level of Consciousness: awake, alert  and oriented  Airway & Oxygen Therapy: Patient Spontanous Breathing and Patient connected to face mask oxygen  Post-op Assessment: Report given to RN and Post -op Vital signs reviewed and stable  Post vital signs: Reviewed and stable  Last Vitals:  Filed Vitals:   01/05/15 0937  BP: 122/53  Pulse: 106  Temp: 36.6 C  Resp: 18    Complications: No apparent anesthesia complications

## 2015-01-05 NOTE — Anesthesia Preprocedure Evaluation (Addendum)
Anesthesia Evaluation  Patient identified by MRN, date of birth, ID band Patient awake    Reviewed: Allergy & Precautions, NPO status , Patient's Chart, lab work & pertinent test results  History of Anesthesia Complications (+) PONV and history of anesthetic complications  Airway Mallampati: II  TM Distance: >3 FB Neck ROM: Full    Dental no notable dental hx.    Pulmonary asthma ,  breath sounds clear to auscultation  Pulmonary exam normal       Cardiovascular negative cardio ROS Normal cardiovascular examRhythm:Regular Rate:Normal     Neuro/Psych negative neurological ROS  negative psych ROS   GI/Hepatic negative GI ROS, Neg liver ROS,   Endo/Other  negative endocrine ROS  Renal/GU Renal disease  negative genitourinary   Musculoskeletal  (+) Arthritis -,   Abdominal   Peds negative pediatric ROS (+)  Hematology negative hematology ROS (+)   Anesthesia Other Findings   Reproductive/Obstetrics negative OB ROS                             Anesthesia Physical Anesthesia Plan  ASA: II  Anesthesia Plan: General   Post-op Pain Management:    Induction: Intravenous  Airway Management Planned: Oral ETT  Additional Equipment:   Intra-op Plan:   Post-operative Plan: Extubation in OR  Informed Consent: I have reviewed the patients History and Physical, chart, labs and discussed the procedure including the risks, benefits and alternatives for the proposed anesthesia with the patient or authorized representative who has indicated his/her understanding and acceptance.   Dental advisory given  Plan Discussed with: CRNA  Anesthesia Plan Comments: (Discussed spinal and general and she prefers general.)       Anesthesia Quick Evaluation

## 2015-01-06 ENCOUNTER — Encounter (HOSPITAL_COMMUNITY): Payer: Self-pay | Admitting: Orthopedic Surgery

## 2015-01-06 DIAGNOSIS — D62 Acute posthemorrhagic anemia: Secondary | ICD-10-CM | POA: Diagnosis not present

## 2015-01-06 LAB — BASIC METABOLIC PANEL
Anion gap: 8 (ref 5–15)
BUN: 12 mg/dL (ref 6–20)
CO2: 28 mmol/L (ref 22–32)
Calcium: 8.9 mg/dL (ref 8.9–10.3)
Chloride: 98 mmol/L — ABNORMAL LOW (ref 101–111)
Creatinine, Ser: 0.51 mg/dL (ref 0.44–1.00)
GFR calc Af Amer: >60 mL/min (ref >60)
GFR calc non Af Amer: >60 mL/min (ref >60)
Glucose, Bld: 128 mg/dL — ABNORMAL HIGH (ref 65–99)
Potassium: 4.1 mmol/L (ref 3.5–5.1)
Sodium: 134 mmol/L — ABNORMAL LOW (ref 135–145)

## 2015-01-06 LAB — CBC
HCT: 33.5 % — ABNORMAL LOW (ref 36.0–46.0)
Hemoglobin: 10.6 g/dL — ABNORMAL LOW (ref 12.0–15.0)
MCH: 26.8 pg (ref 26.0–34.0)
MCHC: 31.6 g/dL (ref 30.0–36.0)
MCV: 84.6 fL (ref 78.0–100.0)
Platelets: 249 10*3/uL (ref 150–400)
RBC: 3.96 MIL/uL (ref 3.87–5.11)
RDW: 12.9 % (ref 11.5–15.5)
WBC: 10.2 10*3/uL (ref 4.0–10.5)

## 2015-01-06 MED ORDER — OXYCODONE-ACETAMINOPHEN 5-325 MG PO TABS: 1.0000 | ORAL_TABLET | ORAL | Status: DC | PRN

## 2015-01-06 MED ORDER — METOCLOPRAMIDE HCL 10 MG PO TABS: 5.0000 mg | ORAL_TABLET | Freq: Four times a day (QID) | ORAL | Status: DC | PRN

## 2015-01-06 MED ORDER — TIZANIDINE HCL 4 MG PO TABS: 4.0000 mg | ORAL_TABLET | Freq: Three times a day (TID) | ORAL | Status: DC | PRN

## 2015-01-06 MED ORDER — METOCLOPRAMIDE HCL 5 MG PO TABS: 5.0000 mg | ORAL_TABLET | Freq: Four times a day (QID) | ORAL | Status: DC | PRN

## 2015-01-06 MED ORDER — METOCLOPRAMIDE HCL 5 MG/ML IJ SOLN
5.0000 mg | Freq: Four times a day (QID) | INTRAMUSCULAR | Status: DC | PRN
  Administered 2015-01-06 (×2): 10 mg via INTRAVENOUS
  Filled 2015-01-06 (×2): qty 2

## 2015-01-06 MED ORDER — RIVAROXABAN 10 MG PO TABS: 10.0000 mg | ORAL_TABLET | Freq: Every day | ORAL | Status: DC

## 2015-01-06 NOTE — Discharge Instructions (Addendum)
INSTRUCTIONS AFTER JOINT REPLACEMENT  ° °Remove items at home which could result in a fall. This includes throw rugs or furniture in walking pathways °ICE to the affected joint every three hours while awake for 30 minutes at a time, for at least the first 3-5 days, and then as needed for pain and swelling.  Continue to use ice for pain and swelling. You may notice swelling that will progress down to the foot and ankle.  This is normal after surgery.  Elevate your leg when you are not up walking on it.   °Continue to use the breathing machine you got in the hospital (incentive spirometer) which will help keep your temperature down.  It is common for your temperature to cycle up and down following surgery, especially at night when you are not up moving around and exerting yourself.  The breathing machine keeps your lungs expanded and your temperature down. ° ° °DIET:  As you were doing prior to hospitalization, we recommend a well-balanced diet. ° °DRESSING / WOUND CARE / SHOWERING ° °Keep the surgical dressing until follow up.  The dressing is water proof, so you can shower without any extra covering.  IF THE DRESSING FALLS OFF or the wound gets wet inside, change the dressing with sterile gauze.  Please use good hand washing techniques before changing the dressing.  Do not use any lotions or creams on the incision until instructed by your surgeon.   ° °ACTIVITY ° °Increase activity slowly as tolerated, but follow the weight bearing instructions below.   °No driving for 6 weeks or until further direction given by your physician.  You cannot drive while taking narcotics.  °No lifting or carrying greater than 10 lbs. until further directed by your surgeon. °Avoid periods of inactivity such as sitting longer than an hour when not asleep. This helps prevent blood clots.  °You may return to work once you are authorized by your doctor.  ° ° ° °WEIGHT BEARING  ° °Weight bearing as tolerated with assist device (walker, cane,  etc) as directed, use it as long as suggested by your surgeon or therapist, typically at least 4-6 weeks. ° ° °EXERCISES ° °Results after joint replacement surgery are often greatly improved when you follow the exercise, range of motion and muscle strengthening exercises prescribed by your doctor. Safety measures are also important to protect the joint from further injury. Any time any of these exercises cause you to have increased pain or swelling, decrease what you are doing until you are comfortable again and then slowly increase them. If you have problems or questions, call your caregiver or physical therapist for advice.  ° °Rehabilitation is important following a joint replacement. After just a few days of immobilization, the muscles of the leg can become weakened and shrink (atrophy).  These exercises are designed to build up the tone and strength of the thigh and leg muscles and to improve motion. Often times heat used for twenty to thirty minutes before working out will loosen up your tissues and help with improving the range of motion but do not use heat for the first two weeks following surgery (sometimes heat can increase post-operative swelling).  ° °These exercises can be done on a training (exercise) mat, on the floor, on a table or on a bed. Use whatever works the best and is most comfortable for you.    Use music or television while you are exercising so that the exercises are a pleasant break in your   day. This will make your life better with the exercises acting as a break in your routine that you can look forward to.   Perform all exercises about fifteen times, three times per day or as directed.  You should exercise both the operative leg and the other leg as well.   Exercises include:   Quad Sets - Tighten up the muscle on the front of the thigh (Quad) and hold for 5-10 seconds.   Straight Leg Raises - With your knee straight (if you were given a brace, keep it on), lift the leg to 60  degrees, hold for 3 seconds, and slowly lower the leg.  Perform this exercise against resistance later as your leg gets stronger.  Leg Slides: Lying on your back, slowly slide your foot toward your buttocks, bending your knee up off the floor (only go as far as is comfortable). Then slowly slide your foot back down until your leg is flat on the floor again.  Angel Wings: Lying on your back spread your legs to the side as far apart as you can without causing discomfort.  Hamstring Strength:  Lying on your back, push your heel against the floor with your leg straight by tightening up the muscles of your buttocks.  Repeat, but this time bend your knee to a comfortable angle, and push your heel against the floor.  You may put a pillow under the heel to make it more comfortable if necessary.   A rehabilitation program following joint replacement surgery can speed recovery and prevent re-injury in the future due to weakened muscles. Contact your doctor or a physical therapist for more information on knee rehabilitation.    CONSTIPATION  Constipation is defined medically as fewer than three stools per week and severe constipation as less than one stool per week.  Even if you have a regular bowel pattern at home, your normal regimen is likely to be disrupted due to multiple reasons following surgery.  Combination of anesthesia, postoperative narcotics, change in appetite and fluid intake all can affect your bowels.   YOU MUST use at least one of the following options; they are listed in order of increasing strength to get the job done.  They are all available over the counter, and you may need to use some, POSSIBLY even all of these options:    Drink plenty of fluids (prune juice may be helpful) and high fiber foods Colace 100 mg by mouth twice a day  Senokot for constipation as directed and as needed Dulcolax (bisacodyl), take with full glass of water  Miralax (polyethylene glycol) once or twice a day as  needed.  If you have tried all these things and are unable to have a bowel movement in the first 3-4 days after surgery call either your surgeon or your primary doctor.    If you experience loose stools or diarrhea, hold the medications until you stool forms back up.  If your symptoms do not get better within 1 week or if they get worse, check with your doctor.  If you experience "the worst abdominal pain ever" or develop nausea or vomiting, please contact the office immediately for further recommendations for treatment.   ITCHING:  If you experience itching with your medications, try taking only a single pain pill, or even half a pain pill at a time.  You can also use Benadryl over the counter for itching or also to help with sleep.   TED HOSE STOCKINGS:  Use stockings on  both legs until for at least 2 weeks or as directed by physician office. They may be removed at night for sleeping.  MEDICATIONS:  See your medication summary on the After Visit Summary that nursing will review with you.  You may have some home medications which will be placed on hold until you complete the course of blood thinner medication.  It is important for you to complete the blood thinner medication as prescribed.  PRECAUTIONS:  If you experience chest pain or shortness of breath - call 911 immediately for transfer to the hospital emergency department.   If you develop a fever greater that 101 F, purulent drainage from wound, increased redness or drainage from wound, foul odor from the wound/dressing, or calf pain - CONTACT YOUR SURGEON.                                                   FOLLOW-UP APPOINTMENTS:  If you do not already have a post-op appointment, please call the office for an appointment to be seen by your surgeon.  Guidelines for how soon to be seen are listed in your After Visit Summary, but are typically between 1-4 weeks after surgery.  OTHER INSTRUCTIONS: Do not take vitamins or supplements until  you complete the 3 weeks course of your blood thinner (Xarelto)  MAKE SURE YOU:  Understand these instructions.  Get help right away if you are not doing well or get worse.    Thank you for letting us be a part of your medical care team.  It is a privilege we respect greatly.  We hope these instructions will help you stay on track for a fast and full recovery!    Information on my medicine - XARELTO (Rivaroxaban)  This medication education was reviewed with me or my healthcare representative as part of my discharge preparation.  The pharmacist that spoke with me during my hospital stay was:  Angela Adam [facility]  Why was Xarelto prescribed for you? Xarelto was prescribed for you to reduce the risk of blood clots forming after orthopedic surgery. The medical term for these abnormal blood clots is venous thromboembolism (VTE).  What do you need to know about xarelto ? Take your Xarelto ONCE DAILY at the same time every day. You may take it either with or without food.  If you have difficulty swallowing the tablet whole, you may crush it and mix in applesauce just prior to taking your dose.  Take Xarelto exactly as prescribed by your doctor and DO NOT stop taking Xarelto without talking to the doctor who prescribed the medication.  Stopping without other VTE prevention medication to take the place of Xarelto may increase your risk of developing a clot.  After discharge, you should have regular check-up appointments with your healthcare provider that is prescribing your Xarelto.    What do you do if you miss a dose? If you miss a dose, take it as soon as you remember on the same day then continue your regularly scheduled once daily regimen the next day. Do not take two doses of Xarelto on the same day.   Important Safety Information A possible side effect of Xarelto is bleeding. You should call your healthcare provider right away if you experience any of the  following: ? Bleeding from an injury or your nose that does  not stop. ? Unusual colored urine (red or dark brown) or unusual colored stools (red or black). ? Unusual bruising for unknown reasons. ? A serious fall or if you hit your head (even if there is no bleeding).  Some medicines may interact with Xarelto and might increase your risk of bleeding while on Xarelto. To help avoid this, consult your healthcare provider or pharmacist prior to using any new prescription or non-prescription medications, including herbals, vitamins, non-steroidal anti-inflammatory drugs (NSAIDs) and supplements.  This website has more information on Xarelto: [email or web address]

## 2015-01-06 NOTE — Care Management Note (Signed)
Case Management Note  Patient Details  Name: Anna [name] MRN: [id number] Date of Birth: 10/20/1933  Subjective/Objective:                   RIGHT TOTAL KNEE ARTHROPLASTY (Right) Action/Plan:  Discharge planning Expected Discharge Date:                  Expected Discharge Plan:  Home w Home Health Services  In-House Referral:     Discharge planning Services  CM Consult  Post Acute Care Choice:  Home Health Choice offered to:  Patient  DME Arranged:  3-N-1 DME Agency:  [facility]  HH Arranged:  PT HH Agency:  [facility]  Status of Service:  Completed, signed off  Medicare Important Message Given:    Date Medicare IM Given:    Medicare IM give by:    Date Additional Medicare IM Given:    Additional Medicare Important Message give by:     If discussed at Long Length of Stay Meetings, dates discussed:    Additional Comments: CM met with pt in room to offer choice of home health agency.  Pt chooses [facility] to render HHPT.  CM called [facility] DME rep, Lecretia to please deliver the 3n1 to room prior to discharge.  Address and contact information verified by pt.  Referral emailed to [facility] rep, Tim. Jeffries, Sarah Christine, RN 01/06/2015, 2:27 PM  

## 2015-01-06 NOTE — Progress Notes (Signed)
Physical Therapy Treatment Patient Details Name: Anna [name] MRN: [id number] DOB: 23-Jan-1934 Today's Date: 01/06/2015    History of Present Illness R TKR    PT Comments    POD # 1 pm session limited by active vomiting earlier just getting on/off BSC.  Performed AAROM TKR TE's while in bed followed by ICE.   Follow Up Recommendations        Equipment Recommendations       Recommendations for Other Services       Precautions / Restrictions      Mobility  Bed Mobility                  Transfers                    Ambulation/Gait                 Stairs            Wheelchair Mobility    Modified Rankin (Stroke Patients Only)       Balance                                    Cognition                            Exercises   Total Knee Replacement TE's 10 reps B LE ankle pumps 10 reps towel squeezes 10 reps knee presses 5  reps heel slides  AAROM 10 reps SLR's AAROM 10 reps ABD Followed by ICE     General Comments        Pertinent Vitals/Pain      Home Living                      Prior Function            PT Goals (current goals can now be found in the care plan section)      Frequency       PT Plan      Co-evaluation             End of Session           Time: 1425-1440 PT Time Calculation (min) (ACUTE ONLY): 15 min  Charges:  $Therapeutic Exercise: 8-22 mins                    G Codes:      Rica Koyanagi  PTA [facility]  Acute  Rehab Pager      [phone]

## 2015-01-06 NOTE — Progress Notes (Signed)
Physical Therapy Evaluation Patient Details Name: Anna [name] MRN: [id number] DOB: [birth year] Today's Date: 01/06/2015   History of Present Illness  R TKR  Clinical Impression  Pt s/p R TKR presents with decreased R LE strength/ROM and post op pain and nausea limiting functional mobility.  Pt should progress to dc home with family assist and HHPT follow up.    Follow Up Recommendations Home health PT    Equipment Recommendations  None recommended by PT    Recommendations for Other Services OT consult     Precautions / Restrictions Precautions Precautions: Knee;Fall Required Braces or Orthoses: Knee Immobilizer - Right Knee Immobilizer - Right: Discontinue once straight leg raise with < 10 degree lag Restrictions Weight Bearing Restrictions: No Other Position/Activity Restrictions: WBAT      Mobility  Bed Mobility Overal bed mobility: Needs Assistance Bed Mobility: Supine to Sit     Supine to sit: Min assist;Mod assist     General bed mobility comments: cues for sequence and use of L LE to self assist  Transfers Overall transfer level: Needs assistance Equipment used: Rolling walker (2 wheeled) Transfers: Sit to/from Stand Sit to Stand: Min assist         General transfer comment: cues for LE management and use of UEs to self assist  Ambulation/Gait Ambulation/Gait assistance: Min assist;Mod assist Ambulation Distance (Feet): 8 Feet Assistive device: Rolling walker (2 wheeled) Gait Pattern/deviations: Step-to pattern;Decreased step length - right;Decreased step length - left;Shuffle Gait velocity: decr   General Gait Details: cues for sequence, posture and position from RW - ltd by onset N&V  Stairs            Wheelchair Mobility    Modified Rankin (Stroke Patients Only)       Balance                                             Pertinent Vitals/Pain Pain Assessment: 0-10 Pain Score: 3  Pain Location: R knee Pain  Descriptors / Indicators: Aching;Sore Pain Intervention(s): Limited activity within patient's tolerance;Monitored during session;Premedicated before session;Ice applied    Home Living Family/patient expects to be discharged to:: Private residence Living Arrangements: Spouse/significant other;Children Available Help at Discharge: Family Type of Home: House Home Access: Ramped entrance     [place]: One level Home Equipment: Environmental consultant - 2 wheels Additional Comments: 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     Prior Function Level of Independence: Independent               Hand Dominance        Extremity/Trunk Assessment   Upper Extremity Assessment: Overall WFL for tasks assessed           Lower Extremity Assessment: RLE deficits/detail RLE Deficits / Details: 2/5 quads with AAROM at knee -10- 35    Cervical / Trunk Assessment: Normal  Communication   Communication: No difficulties  Cognition Arousal/Alertness: Awake/alert Behavior During Therapy: WFL for tasks assessed/performed Overall Cognitive Status: Within Functional Limits for tasks assessed                      General Comments      Exercises Total Joint Exercises Ankle Circles/Pumps: AROM;Both;15 reps;Supine Quad Sets: AROM;Both;10 reps;Supine Heel Slides: AAROM;Right;10 reps;Supine Hip ABduction/ADduction: AAROM;Right;10 reps;Supine Straight Leg Raises: AAROM;Right;10 reps;Supine      Assessment/Plan  PT Assessment Patient needs continued PT services  PT Diagnosis Difficulty walking   PT Problem List Decreased strength;Decreased range of motion;Decreased activity tolerance;Decreased mobility;Decreased knowledge of use of DME;Pain  PT Treatment Interventions DME instruction;Gait training;Functional mobility training;Therapeutic activities;Therapeutic exercise;Patient/family education   PT Goals (Current goals can be found in the Care Plan section) Acute Rehab PT  Goals Patient Stated Goal: Walk without pain PT Goal Formulation: With patient Time For Goal Achievement: 01/13/15 Potential to Achieve Goals: Good    Frequency 7X/week   Barriers to discharge        Co-evaluation               End of Session Equipment Utilized During Treatment: Gait belt;Right knee immobilizer Activity Tolerance: Other (comment) (nausea) Patient left: in chair;with call bell/phone within reach;with nursing/sitter in room;with family/visitor present Nurse Communication: Mobility status         Time: 0810-0857 PT Time Calculation (min) (ACUTE ONLY): 47 min   Charges:   PT Evaluation $Initial PT Evaluation Tier I: 1 Procedure PT Treatments $Gait Training: 8-22 mins $Therapeutic Exercise: 8-22 mins   PT G Codes:        Patrici Minnis Jan 23, 2015, 12:59 PM

## 2015-01-06 NOTE — Op Note (Signed)
NAMESORAYA, PAQUETTE              ACCOUNT NO.:  [id number]  MEDICAL RECORD NO.:  [id number]  LOCATION:  7209                         FACILITY:  [facility]  PHYSICIAN:  Kipp Brood. Zavier Canela, M.D.DATE OF BIRTH:  18-Nov-1933  DATE OF PROCEDURE:  01/05/2015 DATE OF DISCHARGE:                              OPERATIVE REPORT   SURGEON:  Kipp Brood. Gladstone Lighter, M.D.  OPERATIVE ASSISTANT:  Ardeen Jourdain, PA  PREOPERATIVE DIAGNOSIS:  Severe bone-on-bone primary osteoarthritis of the right knee.  POSTOPERATIVE DIAGNOSIS:  Severe bone-on-bone primary osteoarthritis of the right knee.  OPERATION:  Right total knee arthroplasty utilizing the DePuy system. The sizes used was a size 2.5 right femoral component, posterior cruciate sacrificing.  Tibial tray was a size 2.5 with a 12.5-mm thickness rotating platform.  The patella was a size 38 with three pegs.  DESCRIPTION OF PROCEDURE:  Under general anesthesia, routine orthopedic prepping and draping of the right lower extremity was carried out. Appropriate time-out was first carried out.  I also marked the appropriate right leg in the holding area.  At this time, the right leg was exsanguinated with Esmarch, tourniquet was elevated at 300 mmHg.  I then placed the right knee in the [facility] knee holder and the knee was flexed and we carried out an anterior approach and two flaps were created to the right knee.  Following that, a right median parapatellar incision was made.  The patella was reflected laterally.  It was immediately noticed that the patella was completely worn down.  She had severe synovitis, I did a synovectomy.  The femoral condyle itself literally was severely involved with arthritic changes.  We then did medial and lateral meniscectomies.  We did incise the anterior and posterior cruciate ligaments.  We then made initial drill hole in the intercondylar notch.  The guide rod was inserted.  Canal finer was inserted and we had then thoroughly  irrigated out the canal.  I then inserted my next jig, we removed 11 mm thickness off the distal femur. At that particular time, we then measured the femur to be a size 2.5 right.  We did our anterior, posterior and chamfering cuts for size 2.5 of right femur.  Then, attention was directed to the tibial plateau.  We removed the large plateau to the oscillating saw, measured the tibial plateau to be a size 2.  We then made our initial drill hole in the tibial plateau.  Canal finder was inserted and we thoroughly irrigated out the canal.  Following that, we then removed the remaining spurs.  We then inserted our lamina spreaders, there were no posterior spurs.  We then inserted our spacer blocks and felt that a 12.5-mm thickness, tibial insert would be our best to use.  We then prepared the tibial plateau in the usual fashion.  We made our keel cuts out of the plateau. We irrigated the area out and then prepared the femur by doing our notch cut out of the distal femur on the right.  Following that, the trial components were inserted.  We then kept the knee in extension and did a resurfacing procedure on the patella.  For a 38 patella, three drill holes were  made in the articular surface of the patella.  All trial components were removed.  We thoroughly irrigated out the knee, removed the fluid and cemented all three components in simultaneously with gentamicin in the cement.  Once the cement was hardened, we removed all loose pieces of cement.  I then inserted a Hemovac drain after irrigating out the knee.  I then closed the wound layer in usual fashion.  Sterile dressings were applied.  During the procedure, I injected 20 mL of 0.25% plain Marcaine in the soft tissue.  At the end of the procedure, we injected 20 mL of Exparel, mixed with 20 mL of saline.  We also did an appropriate injection of the tranexamic acid, anterior capsular and also she was given 2 g of IV Ancef.  The wound then  was closed as I mentioned in usual fashion.  Sterile dressings were applied.          ______________________________ Kipp Brood Gladstone Lighter, M.D.     RAG/MEDQ  D:  01/05/2015  T:  01/06/2015  Job:  [id number]

## 2015-01-06 NOTE — Progress Notes (Signed)
   Subjective: 1 Day Post-Op Procedure(s) (LRB): RIGHT TOTAL KNEE ARTHROPLASTY (Right) Patient reports pain as moderate.   Patient seen in rounds with Dr. Gladstone Lighter Patient is well, and has had no acute complaints or problems other than pain in the right knee. She reports that she did not sleep well last night due to the pain. No issues with chest pain or SOB. Reports that she stood some with therapy yesterday.   Objective: Vital signs in last 24 hours: Temp:  [97.3 F (36.3 C)-98.3 F (36.8 C)] 97.3 F (36.3 C) (05/12 0100) Pulse Rate:  [92-106] 92 (05/12 0100) Resp:  [12-26] 16 (05/12 0100) BP: (116-139)/(47-60) 132/60 mmHg (05/12 0100) SpO2:  [95 %-100 %] 100 % (05/12 0100) Weight:  [49.896 kg (110 lb)-50.349 kg (111 lb)] 49.896 kg (110 lb) (05/11 1449)  Intake/Output from previous day:  Intake/Output Summary (Last 24 hours) at 01/06/15 0717 Last data filed at 01/06/15 0133  Gross per 24 hour  Intake   1840 ml  Output   1430 ml  Net    410 ml     Labs:  Recent Labs  01/06/15 0430  HGB 10.6*    Recent Labs  01/06/15 0430  WBC 10.2  RBC 3.96  HCT 33.5*  PLT 249    Recent Labs  01/06/15 0430  NA 134*  K 4.1  CL 98*  CO2 28  BUN 12  CREATININE 0.51  GLUCOSE 128*  CALCIUM 8.9    EXAM General - Patient is Alert and Oriented Extremity - Neurologically intact Intact pulses distally Dorsiflexion/Plantar flexion intact No cellulitis present Compartment soft Dressing - dressing C/D/I Motor Function - intact, moving foot and toes well on exam.  Hemovac pulled without difficulty.  Past Medical History  Diagnosis Date  . PONV (postoperative nausea and vomiting)     pt states stays very sleepy for days following anesthesia   . Asthma     hx of in childhood   . Allergy     seasonal also with dogs and cats  . Chronic kidney disease     hx of kidney stones last one pasted 1 month ago   . Bladder infection     hx of   . Arthritis   . Cancer    breast cancer bilat has had double mastectomy   . H/O blood clots     during delivery occurred 50 years ago per left arm   . Fibrocystic breast disease     Assessment/Plan: 1 Day Post-Op Procedure(s) (LRB): RIGHT TOTAL KNEE ARTHROPLASTY (Right) Active Problems:   History of total knee arthroplasty   Postoperative anemia due to acute blood loss  Estimated body mass index is 20.11 kg/(m^2) as calculated from the following:   Height as of this encounter: 5\' 2"  (1.575 m).   Weight as of this encounter: 49.896 kg (110 lb). Advance diet Up with therapy D/C IV fluids when tolerating POs well  DVT Prophylaxis - Xarelto Weight-Bearing as tolerated  D/C O2 and Pulse OX and try on Room Air  She is doing well. Will start with therapy today. Seems to be doing better on the Percocet. Will plan for DC home tomorrow with HHPT.   Ardeen Jourdain, PA-C Orthopaedic Surgery 01/06/2015, 7:17 AM

## 2015-01-06 NOTE — Progress Notes (Signed)
OT Cancellation Note  Patient Details Name: Anna [name] MRN: [id number] DOB: 1934/06/23   Cancelled Treatment:    Reason Eval/Treat Not Completed: Other (comment). Pt was nauseous with PT.  Will check later today or in morning  Ardra Kuznicki 01/06/2015, 9:39 AM  Lesle Chris, OTR/L [phone] 01/06/2015

## 2015-01-07 LAB — BASIC METABOLIC PANEL
Anion gap: 7 (ref 5–15)
BUN: 9 mg/dL (ref 6–20)
CALCIUM: 8.7 mg/dL — AB (ref 8.9–10.3)
CO2: 28 mmol/L (ref 22–32)
Chloride: 99 mmol/L — ABNORMAL LOW (ref 101–111)
Creatinine, Ser: 0.52 mg/dL (ref 0.44–1.00)
GFR calc non Af Amer: >60 mL/min (ref >60)
Glucose, Bld: 151 mg/dL — ABNORMAL HIGH (ref 65–99)
POTASSIUM: 3.7 mmol/L (ref 3.5–5.1)
SODIUM: 134 mmol/L — AB (ref 135–145)

## 2015-01-07 LAB — CBC
HEMATOCRIT: 32.5 % — AB (ref 36.0–46.0)
HEMOGLOBIN: 10.5 g/dL — AB (ref 12.0–15.0)
MCH: 27.3 pg (ref 26.0–34.0)
MCHC: 32.3 g/dL (ref 30.0–36.0)
MCV: 84.4 fL (ref 78.0–100.0)
PLATELETS: 253 10*3/uL (ref 150–400)
RBC: 3.85 MIL/uL — AB (ref 3.87–5.11)
RDW: 13 % (ref 11.5–15.5)
WBC: 8.7 10*3/uL (ref 4.0–10.5)

## 2015-01-07 MED ORDER — PROMETHAZINE HCL 25 MG/ML IJ SOLN: 12.5000 mg | Freq: Four times a day (QID) | INTRAMUSCULAR | Status: DC | PRN

## 2015-01-07 MED ORDER — PROMETHAZINE HCL 12.5 MG PO TABS: 12.5000 mg | ORAL_TABLET | Freq: Four times a day (QID) | ORAL | Status: DC | PRN

## 2015-01-07 MED ORDER — HYDROMORPHONE HCL 2 MG PO TABS: 2.0000 mg | ORAL_TABLET | ORAL | Status: DC | PRN

## 2015-01-07 MED ORDER — HYDROMORPHONE HCL 2 MG PO TABS
2.0000 mg | ORAL_TABLET | ORAL | Status: DC | PRN
  Administered 2015-01-07: 2 mg via ORAL
  Filled 2015-01-07 (×2): qty 1

## 2015-01-07 MED ORDER — PROMETHAZINE HCL 25 MG PO TABS: 12.5000 mg | ORAL_TABLET | Freq: Four times a day (QID) | ORAL | Status: DC | PRN

## 2015-01-07 NOTE — Progress Notes (Signed)
Physical Therapy Treatment Patient Details Name: [name] MRN: [id number] DOB: [birth year] Today's Date: 01/07/2015    History of Present Illness R TKR    PT Comments    Marked improvement in activity tolerance this am.  Pt ambulated short distance in halls with c/o mild nausea only.  Follow Up Recommendations  Home health PT     Equipment Recommendations  None recommended by PT    Recommendations for Other Services OT consult     Precautions / Restrictions Precautions Precautions: Knee;Fall Required Braces or Orthoses: Knee Immobilizer - Right Knee Immobilizer - Right: Discontinue once straight leg raise with < 10 degree lag Restrictions Weight Bearing Restrictions: No Other Position/Activity Restrictions: WBAT    Mobility  Bed Mobility Overal bed mobility: Needs Assistance Bed Mobility: Supine to Sit     Supine to sit: Min assist     General bed mobility comments: assist for RLE and trunk  Transfers Overall transfer level: Needs assistance Equipment used: Rolling walker (2 wheeled) Transfers: Sit to/from Stand Sit to Stand: Min assist         General transfer comment: cues for UE/LE placement  Ambulation/Gait Ambulation/Gait assistance: Min assist Ambulation Distance (Feet): 50 Feet (and 15' twice to/from bathroom) Assistive device: Rolling walker (2 wheeled) Gait Pattern/deviations: Step-to pattern;Decreased step length - right;Decreased step length - left;Shuffle;Trunk flexed Gait velocity: decr   General Gait Details: cues for sequence, posture and position from RW - ltd by onset N&V   Stairs            Wheelchair Mobility    Modified Rankin (Stroke Patients Only)       Balance                                    Cognition Arousal/Alertness: Awake/alert Behavior During Therapy: WFL for tasks assessed/performed Overall Cognitive Status: Within Functional Limits for tasks assessed                       Exercises Total Joint Exercises Ankle Circles/Pumps: AROM;Both;15 reps;Supine Quad Sets: AROM;Both;Supine;15 reps Heel Slides: AAROM;Right;Supine;20 reps Straight Leg Raises: AAROM;Right;Supine;20 reps Goniometric ROM: AAROM R knee -10 - 45    General Comments        Pertinent Vitals/Pain Pain Assessment: 0-10 Pain Score: 4  Pain Location: R knee Pain Descriptors / Indicators: Aching;Sore Pain Intervention(s): Limited activity within patient's tolerance;Monitored during session;Premedicated before session;Ice applied    Home Living Family/patient expects to be discharged to:: Private residence Living Arrangements: Spouse/significant other;Children Available Help at Discharge: Family         Home Equipment: Shower seat Additional Comments: 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  3;1 was delivered to room    Prior Function Level of Independence: Independent          PT Goals (current goals can now be found in the care plan section) Acute Rehab PT Goals Patient Stated Goal: Walk without pain PT Goal Formulation: With patient Time For Goal Achievement: 01/13/15 Potential to Achieve Goals: Good Progress towards PT goals: Progressing toward goals    Frequency  7X/week    PT Plan Current plan remains appropriate    Co-evaluation             End of Session Equipment Utilized During Treatment: Gait belt;Right knee immobilizer Activity Tolerance: Patient tolerated treatment  well Patient left: in chair;with call bell/phone within reach;with nursing/sitter in room;with family/visitor present     Time: 0684-0335 PT Time Calculation (min) (ACUTE ONLY): 47 min  Charges:  $Gait Training: 8-22 mins $Therapeutic Exercise: 8-22 mins $Therapeutic Activity: 8-22 mins                    G Codes:      Anna [name] Jan 22, 2015, 12:35 PM

## 2015-01-07 NOTE — Progress Notes (Signed)
Physical Therapy Treatment Patient Details Name: Anna [name] MRN: [id number] DOB: [birth year] Today's Date: 01/07/2015    History of Present Illness R TKR    PT Comments    POD # 2 pm session.  Pt feeling a little better.  Mild nausea. Assisted OOB to amb to BR to void.  Amb in hallway a greater distance.  Daughter assisted by holding to pt during mobility with direction on safe handling and safety with turns.  Pt amb slowly and demonstrates initial posterior LOB.  Advised daughter to always hands on assist with transfers and amb when home initially. Assisted back to bed.  Removed KI and applied ICE.  Pt and daughter do not feel comfortable leaving today due to pt's nausea and poor po intake.   Follow Up Recommendations  Home health PT     Equipment Recommendations  Kasandra Knudsen (would like [place] to order) Plans to use spouse RW  Recommendations for Other Services       Precautions / Restrictions Precautions Precautions: Knee;Fall Required Braces or Orthoses: Knee Immobilizer - Right Knee Immobilizer - Right: Discontinue once straight leg raise with < 10 degree lag Restrictions Weight Bearing Restrictions: No Other Position/Activity Restrictions: WBAT    Mobility  Bed Mobility Overal bed mobility: Needs Assistance Bed Mobility: Supine to Sit;Sit to Supine     Supine to sit: Min assist Sit to supine: Min assist   General bed mobility comments: assist for RLE and trunk plus increased time  Transfers Overall transfer level: Needs assistance Equipment used: Rolling walker (2 wheeled) Transfers: Sit to/from Stand Sit to Stand: Min assist         General transfer comment: cues for UE/LE placement plus caution to initial posterior lean  Ambulation/Gait Ambulation/Gait assistance: Min assist Ambulation Distance (Feet): 85 Feet Assistive device: Rolling walker (2 wheeled) Gait Pattern/deviations: Step-to pattern;Decreased stance time - right;Trunk flexed Gait velocity:  decreased   General Gait Details: increased time and 50% VC's on proper sequncing and proper walker to self distance. Mild c/o nausea.    Stairs            Wheelchair Mobility    Modified Rankin (Stroke Patients Only)       Balance                                    Cognition Arousal/Alertness: Awake/alert Behavior During Therapy: WFL for tasks assessed/performed Overall Cognitive Status: Within Functional Limits for tasks assessed                      Exercises      General Comments        Pertinent Vitals/Pain Pain Assessment: 0-10 Pain Score: 5  Pain Location: R knee Pain Descriptors / Indicators: Aching;Sore Pain Intervention(s): Monitored during session;Premedicated before session;Repositioned;Ice applied    Home Living                      Prior Function            PT Goals (current goals can now be found in the care plan section) Progress towards PT goals: Progressing toward goals    Frequency  7X/week    PT Plan      Co-evaluation             End of Session Equipment Utilized During Treatment: Gait belt;Right knee immobilizer Activity Tolerance: Patient tolerated  treatment well Patient left: in bed;with call bell/phone within reach;with family/visitor present     Time: 1435-1500 PT Time Calculation (min) (ACUTE ONLY): 25 min  Charges:  $Gait Training: 8-22 mins $Therapeutic Activity: 8-22 mins                    G Codes:      Rica Koyanagi  PTA [facility]  Acute  Rehab Pager      [phone]

## 2015-01-07 NOTE — Evaluation (Signed)
Occupational Therapy Evaluation Patient Details Name: Anna [name] MRN: [id number] DOB: 04-12-1934 Today's Date: 01/07/2015    History of Present Illness R TKR   Clinical Impression   This 81 year old female was admitted for the above surgery. She will benefit from continued OT in acute to address bathroom transfers.  Daughter and granddaughter will assist with ADLs.  Pt limited by fatique and stands with min A.  Goals are for min guard level.    Follow Up Recommendations  Supervision/Assistance - 24 hour    Equipment Recommendations  3 in 1 bedside comode (delivered)    Recommendations for Other Services       Precautions / Restrictions Precautions Precautions: Knee;Fall Required Braces or Orthoses: Knee Immobilizer - Right Knee Immobilizer - Right: Discontinue once straight leg raise with < 10 degree lag Restrictions Other Position/Activity Restrictions: WBAT      Mobility Bed Mobility   Bed Mobility: Supine to Sit     Supine to sit: Min assist     General bed mobility comments: assist for RLE and trunk  Transfers   Equipment used: Rolling walker (2 wheeled) Transfers: Sit to/from Stand Sit to Stand: Min assist         General transfer comment: cues for UE/LE placement    Balance                                            ADL Overall ADL's : Needs assistance/impaired     Grooming: Set up;Wash/dry hands;Wash/dry face;Sitting   Upper Body Bathing: Set up;Sitting   Lower Body Bathing: Sit to/from stand;Moderate assistance   Upper Body Dressing : Minimal assistance;Sitting (iv)   Lower Body Dressing: Maximal assistance;Sit to/from stand                 General ADL Comments: performed ADL from EOB.  Stood for peri area and sidestepped up [facility] with min A.  Educated on AE but pt plans to have family assist her.     Vision     Perception     Praxis      Pertinent Vitals/Pain Pain Score: 2  Pain Location: R  knee Pain Descriptors / Indicators: Sore Pain Intervention(s): Limited activity within patient's tolerance;Monitored during session;Premedicated before session;Repositioned     Hand Dominance     Extremity/Trunk Assessment Upper Extremity Assessment Upper Extremity Assessment: Overall WFL for tasks assessed           Communication Communication Communication: No difficulties   Cognition Arousal/Alertness: Awake/alert Behavior During Therapy: WFL for tasks assessed/performed Overall Cognitive Status: Within Functional Limits for tasks assessed                     General Comments       Exercises       Shoulder Instructions      Home Living Family/patient expects to be discharged to:: Private residence Living Arrangements: Spouse/significant other;Children Available Help at Discharge: Family               Bathroom Shower/Tub: Walk-in Psychologist, prison and probation services: Standard     Home Equipment: Shower seat   Additional Comments: 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  3;1 was delivered to room      Prior Functioning/Environment Level of Independence: Independent  OT Diagnosis: Generalized weakness   OT Problem List: Decreased strength;Decreased activity tolerance;Pain   OT Treatment/Interventions: Self-care/ADL training;DME and/or AE instruction;Patient/family education    OT Goals(Current goals can be found in the care plan section) Acute Rehab OT Goals Patient Stated Goal: Walk without pain OT Goal Formulation: With patient Time For Goal Achievement: 01/14/15 Potential to Achieve Goals: Good ADL Goals Pt Will Transfer to Toilet: with min guard assist;bedside commode;ambulating Pt Will Perform Toileting - Clothing Manipulation and hygiene: with min guard assist;sit to/from stand Pt Will Perform Tub/Shower Transfer: Shower transfer;ambulating;shower seat;with min guard  assist  OT Frequency: Min 2X/week   Barriers to D/C:            Co-evaluation              End of Session    Activity Tolerance: Patient limited by fatigue Patient left: in bed;with call bell/phone within reach;with family/visitor present   Time: 0842-0910 OT Time Calculation (min): 28 min Charges:  OT General Charges $OT Visit: 1 Procedure OT Evaluation $Initial OT Evaluation Tier I: 1 Procedure OT Treatments $Self Care/Home Management : 8-22 mins G-Codes:    Mercedies Ganesh 01-14-15, 10:06 AM  Lesle Chris, OTR/L [phone] 2015/01/14

## 2015-01-07 NOTE — Progress Notes (Signed)
Subjective: 2 Days Post-Op Procedure(s) (LRB): RIGHT TOTAL KNEE ARTHROPLASTY (Right) Patient reports pain as 4 on 0-10 scale. Doing very well. Will DC   Objective: Vital signs in last 24 hours: Temp:  [98.2 F (36.8 C)-99.7 F (37.6 C)] 98.9 F (37.2 C) (05/13 0445) Pulse Rate:  [90-113] 113 (05/13 0445) Resp:  [16-18] 16 (05/13 0445) BP: (116-131)/(43-55) 120/43 mmHg (05/13 0445) SpO2:  [96 %-100 %] 96 % (05/13 0445)  Intake/Output from previous day: 05/12 0701 - 05/13 0700 In: 1540 [P.O.:540; I.V.:1000] Out: 2200 [Urine:2200] Intake/Output this shift:     Recent Labs  01/06/15 0430 01/07/15 0435  HGB 10.6* 10.5*    Recent Labs  01/06/15 0430 01/07/15 0435  WBC 10.2 8.7  RBC 3.96 3.85*  HCT 33.5* 32.5*  PLT 249 253    Recent Labs  01/06/15 0430 01/07/15 0435  NA 134* 134*  K 4.1 3.7  CL 98* 99*  CO2 28 28  BUN 12 9  CREATININE 0.51 0.52  GLUCOSE 128* 151*  CALCIUM 8.9 8.7*   No results for input(s): LABPT, INR in the last 72 hours.  Neurovascular intact No cellulitis present Compartment soft  Assessment/Plan: 2 Days Post-Op Procedure(s) (LRB): RIGHT TOTAL KNEE ARTHROPLASTY (Right) Discharge home with home health  Everest Hacking A 01/07/2015, 7:22 AM

## 2015-01-08 LAB — CBC
HCT: 33.1 % — ABNORMAL LOW (ref 36.0–46.0)
Hemoglobin: 10.7 g/dL — ABNORMAL LOW (ref 12.0–15.0)
MCH: 27.9 pg (ref 26.0–34.0)
MCHC: 32.3 g/dL (ref 30.0–36.0)
MCV: 86.2 fL (ref 78.0–100.0)
Platelets: 266 10*3/uL (ref 150–400)
RBC: 3.84 MIL/uL — ABNORMAL LOW (ref 3.87–5.11)
RDW: 13.3 % (ref 11.5–15.5)
WBC: 8.3 10*3/uL (ref 4.0–10.5)

## 2015-01-08 NOTE — Progress Notes (Signed)
Occupational Therapy Treatment Patient Details Name: Anna [name] MRN: [id number] DOB: August 23, 1934 Today's Date: 01/08/2015    History of present illness R TKR   OT comments   daughter will A at home as needed  Follow Up Recommendations  No OT follow up    Equipment Recommendations  3 in 1 bedside comode       Precautions / Restrictions Precautions Precautions: Knee;Fall Required Braces or Orthoses: Knee Immobilizer - Right Knee Immobilizer - Right: Discontinue once straight leg raise with < 10 degree lag Restrictions Weight Bearing Restrictions: No Other Position/Activity Restrictions: WBAT       Mobility Bed Mobility Overal bed mobility: Needs Assistance Bed Mobility: Supine to Sit     Supine to sit: Min assist     General bed mobility comments: OOB with OT  Transfers Overall transfer level: Needs assistance Equipment used: Rolling walker (2 wheeled) Transfers: Sit to/from Stand Sit to Stand: Min guard         General transfer comment: cues for LE management and use of UEs to self assist        ADL Overall ADL's : Needs assistance/impaired             Lower Body Bathing: Moderate assistance;Sit to/from stand           Toilet Transfer: Min guard;RW;Comfort height toilet;Grab bars   Toileting- Water quality scientist and Hygiene: Min guard;Sit to/from stand   Tub/ Shower Transfer: Min guard                      Cognition   Behavior During Therapy: [facility] for tasks assessed/performed Overall Cognitive Status: Within Functional Limits for tasks assessed                       Extremity/Trunk Assessment               Exercises Total Joint Exercises Ankle Circles/Pumps: AROM;Both;15 reps;Supine Quad Sets: AROM;Both;Supine;15 reps Heel Slides: AAROM;Right;Supine;20 reps Straight Leg Raises: AAROM;Right;Supine;20 reps      General Comments      Pertinent Vitals/ Pain       Pain Assessment: 0-10 Pain Score: 3   Pain Location: r knee Pain Descriptors / Indicators: Sore Pain Intervention(s): Limited activity within patient's tolerance;Monitored during session         Frequency       Progress Toward Goals  OT Goals(current goals can now be found in the care plan section)  Progress towards OT goals: Progressing toward goals  Acute Rehab OT Goals Patient Stated Goal: Walk without pain  Plan Discharge plan remains appropriate    Co-evaluation                 End of Session     Activity Tolerance Patient tolerated treatment well   Patient Left in chair;with call bell/phone within reach;with family/visitor present   Nurse Communication Mobility status        Time: 8527-7824 OT Time Calculation (min): 36 min  Charges: OT General Charges $OT Visit: 1 Procedure OT Treatments $Self Care/Home Management : 23-37 mins  Susannah Carbin, Thereasa Parkin 01/08/2015, 10:13 AM

## 2015-01-08 NOTE — Discharge Summary (Signed)
Physician Discharge Summary   Patient ID: Anna [name] MRN: [id number] DOB/AGE: 31-Jan-1934 81 y.o.  Admit date: 01/05/2015 Discharge date: 01/08/2015  Admission Diagnoses:  Active Problems:   History of total knee arthroplasty   Postoperative anemia due to acute blood loss   Discharge Diagnoses:  Same   Surgeries: Procedure(s): RIGHT TOTAL KNEE ARTHROPLASTY on 01/05/2015   Consultants: PT/OT  Discharged Condition: Stable  Hospital Course: Anna [name] is an 81 y.o. female who was admitted 01/05/2015 with a chief complaint of No chief complaint on file. , and found to have a diagnosis of <principal problem not specified>.  They were brought to the operating room on 01/05/2015 and underwent the above named procedures.    The patient had an uncomplicated hospital course and was stable for discharge.  Recent vital signs:  Filed Vitals:   01/08/15 0629  BP: 108/61  Pulse: 94  Temp: 98.9 F (37.2 C)  Resp: 16    Recent laboratory studies:  Results for orders placed or performed during the hospital encounter of 01/05/15  CBC  Result Value Ref Range   WBC 10.2 4.0 - 10.5 K/uL   RBC 3.96 3.87 - 5.11 MIL/uL   Hemoglobin 10.6 (L) 12.0 - 15.0 g/dL   HCT 33.5 (L) 36.0 - 46.0 %   MCV 84.6 78.0 - 100.0 fL   MCH 26.8 26.0 - 34.0 pg   MCHC 31.6 30.0 - 36.0 g/dL   RDW 12.9 11.5 - 15.5 %   Platelets 249 150 - 400 K/uL  Basic metabolic panel  Result Value Ref Range   Sodium 134 (L) 135 - 145 mmol/L   Potassium 4.1 3.5 - 5.1 mmol/L   Chloride 98 (L) 101 - 111 mmol/L   CO2 28 22 - 32 mmol/L   Glucose, Bld 128 (H) 65 - 99 mg/dL   BUN 12 6 - 20 mg/dL   Creatinine, Ser 0.51 0.44 - 1.00 mg/dL   Calcium 8.9 8.9 - 10.3 mg/dL   GFR calc non Af Amer >60 >60 mL/min   GFR calc Af Amer >60 >60 mL/min   Anion gap 8 5 - 15  CBC  Result Value Ref Range   WBC 8.7 4.0 - 10.5 K/uL   RBC 3.85 (L) 3.87 - 5.11 MIL/uL   Hemoglobin 10.5 (L) 12.0 - 15.0 g/dL   HCT 32.5 (L) 36.0 - 46.0 %    MCV 84.4 78.0 - 100.0 fL   MCH 27.3 26.0 - 34.0 pg   MCHC 32.3 30.0 - 36.0 g/dL   RDW 13.0 11.5 - 15.5 %   Platelets 253 150 - 400 K/uL  Basic metabolic panel  Result Value Ref Range   Sodium 134 (L) 135 - 145 mmol/L   Potassium 3.7 3.5 - 5.1 mmol/L   Chloride 99 (L) 101 - 111 mmol/L   CO2 28 22 - 32 mmol/L   Glucose, Bld 151 (H) 65 - 99 mg/dL   BUN 9 6 - 20 mg/dL   Creatinine, Ser 0.52 0.44 - 1.00 mg/dL   Calcium 8.7 (L) 8.9 - 10.3 mg/dL   GFR calc non Af Amer >60 >60 mL/min   GFR calc Af Amer >60 >60 mL/min   Anion gap 7 5 - 15  CBC  Result Value Ref Range   WBC 8.3 4.0 - 10.5 K/uL   RBC 3.84 (L) 3.87 - 5.11 MIL/uL   Hemoglobin 10.7 (L) 12.0 - 15.0 g/dL   HCT 33.1 (L) 36.0 - 46.0 %  MCV 86.2 78.0 - 100.0 fL   MCH 27.9 26.0 - 34.0 pg   MCHC 32.3 30.0 - 36.0 g/dL   RDW 13.3 11.5 - 15.5 %   Platelets 266 150 - 400 K/uL  Type and screen  Result Value Ref Range   ABO/RH(D) AB POS    Antibody Screen NEG    Sample Expiration 01/08/2015   ABO/Rh  Result Value Ref Range   ABO/RH(D) AB POS     Discharge Medications:     Medication List    STOP taking these medications        aspirin EC 81 MG tablet     cholecalciferol 1000 UNITS tablet  Commonly known as:  VITAMIN D     ibuprofen 200 MG tablet  Commonly known as:  ADVIL,MOTRIN     OMEGA 3 PO      TAKE these medications        cycloSPORINE 0.05 % ophthalmic emulsion  Commonly known as:  RESTASIS  Place 1 drop into both eyes 2 (two) times daily.     HYDROmorphone 2 MG tablet  Commonly known as:  DILAUDID  Take 1 tablet (2 mg total) by mouth every 4 (four) hours as needed for severe pain.     promethazine 12.5 MG tablet  Commonly known as:  PHENERGAN  Take 1 tablet (12.5 mg total) by mouth every 6 (six) hours as needed for nausea or vomiting.     rivaroxaban 10 MG Tabs tablet  Commonly known as:  XARELTO  Take 1 tablet (10 mg total) by mouth daily with breakfast.     tiZANidine 4 MG tablet  Commonly  known as:  ZANAFLEX  Take 1 tablet (4 mg total) by mouth every 8 (eight) hours as needed.        Diagnostic Studies: Dg Chest 2 View  12/28/2014   CLINICAL DATA:  Preop for total knee arthroplasty on 01/05/2015. History of asthma. Bilateral breast cancer. Nonsmoker.  EXAM: CHEST  2 VIEW  COMPARISON:  None  FINDINGS: S shaped thoracolumbar spine curvature. Midline trachea. Borderline cardiomegaly. Mediastinal contours otherwise within normal limits. No pleural effusion or pneumothorax. Clear lungs.  IMPRESSION: No acute cardiopulmonary disease.   Electronically Signed   By: Abigail Miyamoto M.D.   On: 12/28/2014 17:13    Disposition:       Discharge Instructions    Call MD / Call 911    Complete by:  As directed   If you experience chest pain or shortness of breath, CALL 911 and be transported to the hospital emergency room.  If you develope a fever above 101 F, pus (white drainage) or increased drainage or redness at the wound, or calf pain, call your surgeon's office.     Constipation Prevention    Complete by:  As directed   Drink plenty of fluids.  Prune juice may be helpful.  You may use a stool softener, such as Colace (over the counter) 100 mg twice a day.  Use MiraLax (over the counter) for constipation as needed.     Diet - low sodium heart healthy    Complete by:  As directed      Discharge instructions    Complete by:  As directed   INSTRUCTIONS AFTER JOINT REPLACEMENT   Remove items at home which could result in a fall. This includes throw rugs or furniture in walking pathways ICE to the affected joint every three hours while awake for 30 minutes at a time, for  at least the first 3-5 days, and then as needed for pain and swelling.  Continue to use ice for pain and swelling. You may notice swelling that will progress down to the foot and ankle.  This is normal after surgery.  Elevate your leg when you are not up walking on it.   Continue to use the breathing machine you got in the  hospital (incentive spirometer) which will help keep your temperature down.  It is common for your temperature to cycle up and down following surgery, especially at night when you are not up moving around and exerting yourself.  The breathing machine keeps your lungs expanded and your temperature down.   DIET:  As you were doing prior to hospitalization, we recommend a well-balanced diet.  DRESSING / WOUND CARE / SHOWERING  Keep the surgical dressing until follow up.  The dressing is water proof, so you can shower without any extra covering.  IF THE DRESSING FALLS OFF or the wound gets wet inside, change the dressing with sterile gauze.  Please use good hand washing techniques before changing the dressing.  Do not use any lotions or creams on the incision until instructed by your surgeon.    ACTIVITY  Increase activity slowly as tolerated, but follow the weight bearing instructions below.   No driving for 6 weeks or until further direction given by your physician.  You cannot drive while taking narcotics.  No lifting or carrying greater than 10 lbs. until further directed by your surgeon. Avoid periods of inactivity such as sitting longer than an hour when not asleep. This helps prevent blood clots.  You may return to work once you are authorized by your doctor.     WEIGHT BEARING   Weight bearing as tolerated with assist device (walker, cane, etc) as directed, use it as long as suggested by your surgeon or therapist, typically at least 4-6 weeks.   EXERCISES  Results after joint replacement surgery are often greatly improved when you follow the exercise, range of motion and muscle strengthening exercises prescribed by your doctor. Safety measures are also important to protect the joint from further injury. Any time any of these exercises cause you to have increased pain or swelling, decrease what you are doing until you are comfortable again and then slowly increase them. If you have  problems or questions, call your caregiver or physical therapist for advice.   Rehabilitation is important following a joint replacement. After just a few days of immobilization, the muscles of the leg can become weakened and shrink (atrophy).  These exercises are designed to build up the tone and strength of the thigh and leg muscles and to improve motion. Often times heat used for twenty to thirty minutes before working out will loosen up your tissues and help with improving the range of motion but do not use heat for the first two weeks following surgery (sometimes heat can increase post-operative swelling).   These exercises can be done on a training (exercise) mat, on the floor, on a table or on a bed. Use whatever works the best and is most comfortable for you.    Use music or television while you are exercising so that the exercises are a pleasant break in your day. This will make your life better with the exercises acting as a break in your routine that you can look forward to.   Perform all exercises about fifteen times, three times per day or as directed.  You should  exercise both the operative leg and the other leg as well.   Exercises include:   Quad Sets - Tighten up the muscle on the front of the thigh (Quad) and hold for 5-10 seconds.   Straight Leg Raises - With your knee straight (if you were given a brace, keep it on), lift the leg to 60 degrees, hold for 3 seconds, and slowly lower the leg.  Perform this exercise against resistance later as your leg gets stronger.  Leg Slides: Lying on your back, slowly slide your foot toward your buttocks, bending your knee up off the floor (only go as far as is comfortable). Then slowly slide your foot back down until your leg is flat on the floor again.  Angel Wings: Lying on your back spread your legs to the side as far apart as you can without causing discomfort.  Hamstring Strength:  Lying on your back, push your heel against the floor with your  leg straight by tightening up the muscles of your buttocks.  Repeat, but this time bend your knee to a comfortable angle, and push your heel against the floor.  You may put a pillow under the heel to make it more comfortable if necessary.   A rehabilitation program following joint replacement surgery can speed recovery and prevent re-injury in the future due to weakened muscles. Contact your doctor or a physical therapist for more information on knee rehabilitation.    CONSTIPATION  Constipation is defined medically as fewer than three stools per week and severe constipation as less than one stool per week.  Even if you have a regular bowel pattern at home, your normal regimen is likely to be disrupted due to multiple reasons following surgery.  Combination of anesthesia, postoperative narcotics, change in appetite and fluid intake all can affect your bowels.   YOU MUST use at least one of the following options; they are listed in order of increasing strength to get the job done.  They are all available over the counter, and you may need to use some, POSSIBLY even all of these options:    Drink plenty of fluids (prune juice may be helpful) and high fiber foods Colace 100 mg by mouth twice a day  Senokot for constipation as directed and as needed Dulcolax (bisacodyl), take with full glass of water  Miralax (polyethylene glycol) once or twice a day as needed.  If you have tried all these things and are unable to have a bowel movement in the first 3-4 days after surgery call either your surgeon or your primary doctor.    If you experience loose stools or diarrhea, hold the medications until you stool forms back up.  If your symptoms do not get better within 1 week or if they get worse, check with your doctor.  If you experience "the worst abdominal pain ever" or develop nausea or vomiting, please contact the office immediately for further recommendations for treatment.   ITCHING:  If you experience  itching with your medications, try taking only a single pain pill, or even half a pain pill at a time.  You can also use Benadryl over the counter for itching or also to help with sleep.   TED HOSE STOCKINGS:  Use stockings on both legs until for at least 2 weeks or as directed by physician office. They may be removed at night for sleeping.  MEDICATIONS:  See your medication summary on the "After Visit Summary" that nursing will review with you.  You may have some home medications which will be placed on hold until you complete the course of blood thinner medication.  It is important for you to complete the blood thinner medication as prescribed.  PRECAUTIONS:  If you experience chest pain or shortness of breath - call 911 immediately for transfer to the hospital emergency department.   If you develop a fever greater that 101 F, purulent drainage from wound, increased redness or drainage from wound, foul odor from the wound/dressing, or calf pain - CONTACT YOUR SURGEON.                                                   FOLLOW-UP APPOINTMENTS:  If you do not already have a post-op appointment, please call the office for an appointment to be seen by your surgeon.  Guidelines for how soon to be seen are listed in your "After Visit Summary", but are typically between 1-4 weeks after surgery.  OTHER INSTRUCTIONS: Do not take vitamins or supplements until you complete the 3 weeks course of your blood thinner (Xarelto)  MAKE SURE YOU:  Understand these instructions.  Get help right away if you are not doing well or get worse.    Thank you for letting us be a part of your medical care team.  It is a privilege we respect greatly.  We hope these instructions will help you stay on track for a fast and full recovery!     Increase activity slowly as tolerated    Complete by:  As directed            Follow-up Information    Follow up with [facility].   Why:  home health physical therapy    Contact information:   [address] [address] [phone]       Follow up with [facility].   Why:  3n1 (commode)   Contact information:   [address] [place] [address] [phone]       Follow up with GIOFFRE,RONALD A, MD. Schedule an appointment as soon as possible for a visit in 2 weeks.   Specialty:  Orthopedic Surgery   Contact information:   [address] [place] [address] [phone]        Signed: Ventura Bruns 01/08/2015, 7:33 AM

## 2015-01-08 NOTE — Progress Notes (Signed)
Physical Therapy Treatment Patient Details Name: Anna [name] MRN: [id number] DOB: December 04, 1933 Today's Date: 01/08/2015    History of Present Illness R TKR    PT Comments    Pt progressing well with decreased nausea.  Reviewed stairs, home therex and don/doff KI with pt and dtr.  Follow Up Recommendations  Home health PT     Equipment Recommendations       Recommendations for Other Services OT consult     Precautions / Restrictions Precautions Precautions: Knee;Fall Required Braces or Orthoses: Knee Immobilizer - Right Knee Immobilizer - Right: Discontinue once straight leg raise with < 10 degree lag Restrictions Weight Bearing Restrictions: No Other Position/Activity Restrictions: WBAT    Mobility  Bed Mobility Overal bed mobility: Needs Assistance Bed Mobility: Supine to Sit     Supine to sit: Min assist     General bed mobility comments: OOB with OT  Transfers Overall transfer level: Needs assistance Equipment used: Rolling walker (2 wheeled) Transfers: Sit to/from Stand Sit to Stand: Min guard         General transfer comment: cues for LE management and use of UEs to self assist  Ambulation/Gait Ambulation/Gait assistance: Min guard;Supervision Ambulation Distance (Feet): 85 Feet Assistive device: Rolling walker (2 wheeled) Gait Pattern/deviations: Step-to pattern;Decreased step length - right;Decreased step length - left;Shuffle;Trunk flexed Gait velocity: decreased   General Gait Details: min cues for posture, ER on R and position from RW   Stairs Stairs: Yes Stairs assistance: Min assist Stair Management: No rails;Backwards;Step to pattern;With walker Number of Stairs: 1 General stair comments: single step bkwd to utilize stool or running board to get into high SUV  Wheelchair Mobility    Modified Rankin (Stroke Patients Only)       Balance                                    Cognition Arousal/Alertness:  Awake/alert Behavior During Therapy: WFL for tasks assessed/performed Overall Cognitive Status: Within Functional Limits for tasks assessed                      Exercises Total Joint Exercises Ankle Circles/Pumps: AROM;Both;15 reps;Supine Quad Sets: AROM;Both;Supine;15 reps Heel Slides: AAROM;Right;Supine;20 reps Straight Leg Raises: AAROM;Right;Supine;20 reps    General Comments        Pertinent Vitals/Pain Pain Assessment: 0-10 Pain Score: 3  Pain Location: r knee Pain Descriptors / Indicators: Sore Pain Intervention(s): Limited activity within patient's tolerance;Monitored during session    Home Living                      Prior Function            PT Goals (current goals can now be found in the care plan section) Acute Rehab PT Goals Patient Stated Goal: Walk without pain PT Goal Formulation: With patient Time For Goal Achievement: 01/13/15 Potential to Achieve Goals: Good Progress towards PT goals: Progressing toward goals    Frequency  7X/week    PT Plan Current plan remains appropriate    Co-evaluation             End of Session Equipment Utilized During Treatment: Gait belt;Right knee immobilizer Activity Tolerance: Patient tolerated treatment well Patient left: in chair;with call bell/phone within reach;with family/visitor present     Time: 0924-1006 PT Time Calculation (min) (ACUTE ONLY): 42 min  Charges:  $Gait Training:  8-22 mins $Therapeutic Exercise: 8-22 mins $Therapeutic Activity: 8-22 mins                    G Codes:      Anna [name] 01/14/15, 10:13 AM

## 2015-01-08 NOTE — Progress Notes (Signed)
   Subjective: 3 Days Post-Op Procedure(s) (LRB): RIGHT TOTAL KNEE ARTHROPLASTY (Right)  Pt feeling much better  No nausea today Ready for d/c home Patient reports pain as mild.  Objective:   VITALS:   Filed Vitals:   01/08/15 0629  BP: 108/61  Pulse: 94  Temp: 98.9 F (37.2 C)  Resp: 16    Right knee incision healing well nv intact distally No rashes or edema  LABS  Recent Labs  01/06/15 0430 01/07/15 0435 01/08/15 0508  HGB 10.6* 10.5* 10.7*  HCT 33.5* 32.5* 33.1*  WBC 10.2 8.7 8.3  PLT 249 253 266     Recent Labs  01/06/15 0430 01/07/15 0435  NA 134* 134*  K 4.1 3.7  BUN 12 9  CREATININE 0.51 0.52  GLUCOSE 128* 151*     Assessment/Plan: 3 Days Post-Op Procedure(s) (LRB): RIGHT TOTAL KNEE ARTHROPLASTY (Right) D/c home today after therapy  F/u in 2 weeks Pt and family in agreement   Merla Riches, MPAS, PA-C  01/08/2015, 7:32 AM

## 2015-01-08 NOTE — Progress Notes (Signed)
Pt d/c home with [facility]. Potty chair and cane delivered to room before d/c. Pt feels much more confident today. All nausea subsided. Patient and family feel ready to go home. AVS reviewed and "My Chart" discussed with pt and family. Pt capable of verbalizing medications, signs and symptoms of infection, and follow-up appointments. Remains hemodynamically stable. No signs and symptoms of distress. Educated pt to return to ER in the case of SOB, dizziness, or chest pain.

## 2015-01-28 ENCOUNTER — Ambulatory Visit: Payer: Medicare Other | Attending: Orthopedic Surgery | Admitting: Rehabilitation

## 2015-01-28 ENCOUNTER — Encounter: Payer: Self-pay | Admitting: Rehabilitation

## 2015-01-28 DIAGNOSIS — M25661 Stiffness of right knee, not elsewhere classified: Secondary | ICD-10-CM | POA: Diagnosis not present

## 2015-01-28 DIAGNOSIS — R269 Unspecified abnormalities of gait and mobility: Secondary | ICD-10-CM | POA: Diagnosis not present

## 2015-01-28 DIAGNOSIS — R29898 Other symptoms and signs involving the musculoskeletal system: Secondary | ICD-10-CM | POA: Insufficient documentation

## 2015-01-28 DIAGNOSIS — M25561 Pain in right knee: Secondary | ICD-10-CM | POA: Diagnosis not present

## 2015-01-28 NOTE — Therapy (Signed)
[place], [place], [address] Phone: [phone]   Fax:  [phone]  Physical Therapy Evaluation  Patient Details  Name: Anna [name] MRN: [id number] Date of Birth: [birth year] Referring Provider:  Latanya Maudlin, MD  Encounter Date: 01/28/2015      PT End of Session - 01/28/15 1013    Visit Number 1   PT Start Time 1013   PT Stop Time 1113   PT Time Calculation (min) 60 min      Past Medical History  Diagnosis Date  . PONV (postoperative nausea and vomiting)     pt states stays very sleepy for days following anesthesia   . Asthma     hx of in childhood   . Allergy     seasonal also with dogs and cats  . Chronic kidney disease     hx of kidney stones last one pasted 1 month ago   . Bladder infection     hx of   . Arthritis   . Cancer     breast cancer bilat has had double mastectomy   . H/O blood clots     during delivery occurred 50 years ago per left arm   . Fibrocystic breast disease     Past Surgical History  Procedure Laterality Date  . Cesarean section      times 2  . Abdominal hysterectomy    . Dilation and curettage of uterus    . Appendectomy    . Eye surgery      bilat cataract surgery   . Breast surgery      double mastectomy   . Back surgery      disc repaired 20 years ago   . Colonscopy     . Hand surgery      left hand / joint issues with thumb   . 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 Cyst repaired       following mammogram / cyst ruptured had to surgically repair area / left side   . Right knee arthroscopy     . Total knee arthroplasty Right 01/05/2015    Procedure: RIGHT TOTAL KNEE ARTHROPLASTY;  Surgeon: Latanya Maudlin, MD;  Location: [facility];  Service: Orthopedics;  Laterality: Right;    There were no vitals filed for this visit.  Visit Diagnosis:  Knee pain, acute, right - Plan: PT plan of care  cert/re-cert  Abnormality of gait - Plan: PT plan of care cert/re-cert  Stiffness of right knee - Plan: PT plan of care cert/re-cert  Weakness of right leg - Plan: PT plan of care cert/re-cert          [facility] PT Assessment - 01/28/15 0001    ROM / Strength   AROM / PROM / Strength AROM;Strength   AROM   AROM Assessment Site Knee   Right/Left Knee Right   Right Knee Extension -10   Right Knee Flexion 95   Strength   Strength Assessment Site Knee   Right/Left Knee Right   Right Knee Flexion 4-/5   Right Knee Extension 4-/5   Ambulation/Gait   Ambulation/Gait Yes   Ambulation/Gait Assistance 4: Min guard   Ambulation Distance (Feet) 150 Feet  150'   Assistive device None   Gait Pattern Decreased step length - right;Narrow base of support   Ambulation Surface Level   Gait velocity  slow, guarded  slow   Standardized Balance Assessment   Standardized Balance Assessment Berg Balance Test;Timed Up and Go Test   Berg Balance Test   Sit to Stand Able to stand  independently using hands   Standing Unsupported Able to stand safely 2 minutes   Sitting with Back Unsupported but Feet Supported on Floor or Stool Able to sit safely and securely 2 minutes   Stand to Sit Sits safely with minimal use of hands   Transfers Able to transfer safely, definite need of hands   Standing Unsupported with Eyes Closed Able to stand 10 seconds safely   Standing Ubsupported with Feet Together Able to place feet together independently and stand 1 minute safely   From Standing, Reach Forward with Outstretched Arm Can reach confidently >25 cm (10")   From Standing Position, Pick up Object from Floor Able to pick up shoe safely and easily   From Standing Position, Turn to Look Behind Over each Shoulder Looks behind from both sides and weight shifts well   Turn 360 Degrees Able to turn 360 degrees safely but slowly   Standing Unsupported, Alternately Place Feet on Step/Stool Able to stand independently  and safely and complete 8 steps in 20 seconds   Standing Unsupported, One Foot in Front Needs help to step but can hold 15 seconds   Standing on One Leg Able to lift leg independently and hold equal to or more than 3 seconds   Total Score 47   Timed Up and Go Test   TUG Normal TUG   Manual TUG (seconds) 20                   [facility] Adult PT Treatment/Exercise - 01/28/15 0001    Modalities   Modalities Cryotherapy;Electrical Stimulation   Cryotherapy   Number Minutes Cryotherapy 15 Minutes   Cryotherapy Location Knee   Type of Cryotherapy Ice pack   Electrical Stimulation   Electrical Stimulation Location r knee   Electrical Stimulation Parameters IFC to tolerance   Electrical Stimulation Goals Pain                PT Education - 01/28/15 1015    Education provided Yes   Education Details reviewed bed and sink HEP   Person(s) Educated Patient;Child(ren)   Methods Explanation;Demonstration   Comprehension Verbalized understanding;Returned demonstration          PT Short Term Goals - 01/28/15 1015    PT SHORT TERM GOAL #1   Title Pt. will be independent with HEP    Time 4   Period Weeks   Status New   PT SHORT TERM GOAL #2   Title Pt. will demo 5/5 RLE strength   Time 4   Period Weeks   PT SHORT TERM GOAL #3   Title Pt. will ambulate safey and independently without any device on indoor, outdoor, unlevel, steps   Baseline 4   Period Weeks   Status New           PT Long Term Goals - 01/28/15 1016    PT LONG TERM GOAL #1   Title Pt. will report 0-1/10 knee pain   Time 8   Period Weeks   Status New   PT LONG TERM GOAL #2   Title Pt. will have TUG score of 10-12 to reduce fall risk   Time 8   Period Weeks   Status New   PT LONG TERM GOAL #3   Title Pt.  will have BERG score of greater than or equal to 52 to reduce fall risk and ambulate safely without device   Time 4   Period Weeks   Status New   PT LONG TERM GOAL #4   Title Pt. will have  R knee ROM of 0-120 deg   Time 8   Period Weeks   Status New               Plan - 01/28/15 1210    Clinical Impression Statement S/P R TKA 1 month ago.    Finished HH PT 1 wk ago.    ROM -10 to 95.   Edema is moderate.    Incision is well healing, but still with some dermabond over distal 1/2 of incision.   Distal scar is immobilie.   Gait without a device is unsteady and unsafe, so we have recommended she continue with [facility] for safety.   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     Pt will benefit from skilled therapeutic intervention in order to improve on the following deficits Abnormal gait;Decreased range of motion;Difficulty walking;Pain;Decreased strength;Increased edema   Rehab Potential Good   PT Frequency 3x / week   PT Duration 8 weeks   PT Treatment/Interventions Patient/family education;Functional mobility training;ADLs/Self Care Home Management;Therapeutic activities;Passive range of motion;Therapeutic exercise;Ultrasound;Cryotherapy;Electrical Stimulation;Gait training;Manual Water quality scientist   PT Next Visit Plan add stretching to HEP, add gym ex, work on ROM and balance   PT Home Exercise Plan reviewed HEP without UE support for standing portion   Consulted and Agree with Plan of Care Patient;Family member/caregiver         Problem List Patient Active Problem List   Diagnosis Date Noted  . Postoperative anemia due to acute blood loss 01/06/2015  . History of total knee arthroplasty 01/05/2015    Volney American, PT 01/28/2015, 12:19 PM  [place], [place], [address] Phone: [phone]   Fax:  [phone]

## 2015-01-31 ENCOUNTER — Ambulatory Visit: Payer: Medicare Other | Admitting: Rehabilitation

## 2015-01-31 DIAGNOSIS — M25561 Pain in right knee: Secondary | ICD-10-CM | POA: Diagnosis not present

## 2015-01-31 DIAGNOSIS — R269 Unspecified abnormalities of gait and mobility: Secondary | ICD-10-CM

## 2015-01-31 DIAGNOSIS — R29898 Other symptoms and signs involving the musculoskeletal system: Secondary | ICD-10-CM

## 2015-01-31 NOTE — Therapy (Signed)
[place], [place], [address] Phone: [phone]   Fax:  [phone]  Physical Therapy Treatment  Patient Details  Name: Anna [name] MRN: [id number] Date of Birth: [birth year] Referring Provider:  Latanya Maudlin, MD  Encounter Date: 01/31/2015      PT End of Session - 01/31/15 1214    Visit Number 2   PT Start Time 1020   PT Stop Time 1120   PT Time Calculation (min) 60 min      Past Medical History  Diagnosis Date  . PONV (postoperative nausea and vomiting)     pt states stays very sleepy for days following anesthesia   . Asthma     hx of in childhood   . Allergy     seasonal also with dogs and cats  . Chronic kidney disease     hx of kidney stones last one pasted 1 month ago   . Bladder infection     hx of   . Arthritis   . Cancer     breast cancer bilat has had double mastectomy   . H/O blood clots     during delivery occurred 50 years ago per left arm   . Fibrocystic breast disease     Past Surgical History  Procedure Laterality Date  . Cesarean section      times 2  . Abdominal hysterectomy    . Dilation and curettage of uterus    . Appendectomy    . Eye surgery      bilat cataract surgery   . Breast surgery      double mastectomy   . Back surgery      disc repaired 20 years ago   . Colonscopy     . Hand surgery      left hand / joint issues with thumb   . 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 Cyst repaired       following mammogram / cyst ruptured had to surgically repair area / left side   . Right knee arthroscopy     . Total knee arthroplasty Right 01/05/2015    Procedure: RIGHT TOTAL KNEE ARTHROPLASTY;  Surgeon: Latanya Maudlin, MD;  Location: [facility];  Service: Orthopedics;  Laterality: Right;    There were no vitals filed for this visit.  Visit Diagnosis:  Knee pain, acute, right  Abnormality of gait  Weakness of  right leg      Subjective Assessment - 01/31/15 1211    Subjective Pt. reports doing ok.   Has been doing HEP per granddaughter who is with her and icing knee more frequently.     Patient is accompained by: Family member   Currently in Pain? Yes   Pain Score 5    Pain Location Knee   Pain Orientation Right   Pain Descriptors / Indicators Aching   Pain Type Surgical pain   Pain Frequency Constant   Aggravating Factors  gait, endrange knee flexion                         [facility] Adult PT Treatment/Exercise - 01/31/15 0001    Ambulation/Gait   Ambulation/Gait Yes   Ambulation/Gait Assistance 4: Min guard   Assistive device None   Ambulation Surface Level   Gait Comments manual training for BUE reciprocal arm swing, facilitation for  trunk/hip rotation   Exercises   Exercises Knee/Hip   Knee/Hip Exercises: Aerobic   Stationary Bike 5' L0   Isokinetic NuStep x 5' L1;  2 RB   Knee/Hip Exercises: Standing   Heel Raises 20 reps   Heel Raises Limitations on foam   Knee Flexion 20 reps   Knee Flexion Limitations marching on foam   Functional Squat 20 reps   Functional Squat Limitations on foam   Other Standing Knee Exercises active trunk rotation with BUE swing    Modalities   Modalities Cryotherapy   Cryotherapy   Number Minutes Cryotherapy 15 Minutes   Cryotherapy Location Knee   Type of Cryotherapy Ice pack   Electrical Stimulation   Electrical Stimulation Location r knee   Electrical Stimulation Parameters IFC to tolerance   Electrical Stimulation Goals Pain   Knee/Hip Exercises: Machines for Strengthening   Cybex Knee Extension 5# 2/10   Cybex Knee Flexion 5# 2/10   Cybex Leg Press 20# 2/10                PT Education - 01/31/15 1212    Education provided Yes   Education Details sidestepping, f/b gait, active trunk rotation with arm swing   Person(s) Educated Patient;Other (comment)   Methods Explanation;Demonstration   Comprehension  Verbalized understanding;Returned demonstration;Tactile cues required;Need further instruction          PT Short Term Goals - 01/28/15 1015    PT SHORT TERM GOAL #1   Title Pt. will be independent with HEP    Time 4   Period Weeks   Status New   PT SHORT TERM GOAL #2   Title Pt. will demo 5/5 RLE strength   Time 4   Period Weeks   PT SHORT TERM GOAL #3   Title Pt. will ambulate safey and independently without any device on indoor, outdoor, unlevel, steps   Baseline 4   Period Weeks   Status New           PT Long Term Goals - 01/28/15 1016    PT LONG TERM GOAL #1   Title Pt. will report 0-1/10 knee pain   Time 8   Period Weeks   Status New   PT LONG TERM GOAL #2   Title Pt. will have TUG score of 10-12 to reduce fall risk   Time 8   Period Weeks   Status New   PT LONG TERM GOAL #3   Title Pt. will have BERG score of greater than or equal to 52 to reduce fall risk and ambulate safely without device   Time 4   Period Weeks   Status New   PT LONG TERM GOAL #4   Title Pt. will have R knee ROM of 0-120 deg   Time 8   Period Weeks   Status New               Plan - 01/31/15 1214    Clinical Impression Statement Pt. is ambulating without cane in her home, but arrives to clinic using [place] as advised, so she is compliant.   Minimal trunk rotation/BUE swing with gait and holds BUE in high guard.    Very tentative with gait.    Focus today is on dynamic balance.   Will address flexion ROM next visit   PT Next Visit Plan work on flexion ROM, dynamic balance/gait and trunk/BUE mvmt        Problem List Patient Active Problem List  Diagnosis Date Noted  . Postoperative anemia due to acute blood loss 01/06/2015  . History of total knee arthroplasty 01/05/2015    Volney American, PT 01/31/2015, 12:17 PM  [place] [address] [place], [place], [address] Phone: [phone]   Fax:   [phone]

## 2015-02-02 ENCOUNTER — Ambulatory Visit: Payer: Medicare Other | Admitting: Physical Therapy

## 2015-02-02 DIAGNOSIS — R29898 Other symptoms and signs involving the musculoskeletal system: Secondary | ICD-10-CM

## 2015-02-02 DIAGNOSIS — M25561 Pain in right knee: Secondary | ICD-10-CM

## 2015-02-02 DIAGNOSIS — R269 Unspecified abnormalities of gait and mobility: Secondary | ICD-10-CM

## 2015-02-02 DIAGNOSIS — M25661 Stiffness of right knee, not elsewhere classified: Secondary | ICD-10-CM

## 2015-02-02 NOTE — Therapy (Signed)
[place], [place], [address] Phone: [phone]   Fax:  [phone]  Physical Therapy Treatment  Patient Details  Name: Anna [name] MRN: [id number] Date of Birth: [birth year] Referring Provider:  Latanya Maudlin, MD  Encounter Date: 02/02/2015      PT End of Session - 02/02/15 1529    Visit Number 3   PT Start Time 1696   PT Stop Time 1540   PT Time Calculation (min) 56 min   Activity Tolerance Patient tolerated treatment well   Behavior During Therapy [facility] for tasks assessed/performed      Past Medical History  Diagnosis Date  . PONV (postoperative nausea and vomiting)     pt states stays very sleepy for days following anesthesia   . Asthma     hx of in childhood   . Allergy     seasonal also with dogs and cats  . Chronic kidney disease     hx of kidney stones last one pasted 1 month ago   . Bladder infection     hx of   . Arthritis   . Cancer     breast cancer bilat has had double mastectomy   . H/O blood clots     during delivery occurred 50 years ago per left arm   . Fibrocystic breast disease     Past Surgical History  Procedure Laterality Date  . Cesarean section      times 2  . Abdominal hysterectomy    . Dilation and curettage of uterus    . Appendectomy    . Eye surgery      bilat cataract surgery   . Breast surgery      double mastectomy   . Back surgery      disc repaired 20 years ago   . Colonscopy     . Hand surgery      left hand / joint issues with thumb   . 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 Cyst repaired       following mammogram / cyst ruptured had to surgically repair area / left side   . Right knee arthroscopy     . Total knee arthroplasty Right 01/05/2015    Procedure: RIGHT TOTAL KNEE ARTHROPLASTY;  Surgeon: Latanya Maudlin, MD;  Location: [facility];  Service: Orthopedics;  Laterality: Right;    There were  no vitals filed for this visit.  Visit Diagnosis:  Knee pain, acute, right  Weakness of right leg  Abnormality of gait  Stiffness of right knee      Subjective Assessment - 02/02/15 1449    Subjective Doing well today; reports knee pain ~2/10   Patient is accompained by: Family member   Currently in Pain? Yes   Pain Score 2    Pain Location Knee   Pain Orientation Right   Pain Descriptors / Indicators Aching   Pain Type Surgical pain   Pain Frequency Constant   Aggravating Factors  gait; end range flexion                         [facility] Adult PT Treatment/Exercise - 02/02/15 1450    Knee/Hip Exercises: Aerobic   Stationary Bike 6 min   Isokinetic NuStep Level 5 x 6 min   Knee/Hip Exercises: Supine   Quad Sets Right;5 reps  Straight Leg Raises Right;10 reps   Cryotherapy   Number Minutes Cryotherapy 15 Minutes   Cryotherapy Location Knee   Type of Cryotherapy Ice pack   Electrical Stimulation   Electrical Stimulation Location r knee   Electrical Stimulation Parameters IFC to tolerance x 15 min   Electrical Stimulation Goals Pain;Edema   Manual Therapy   Manual Therapy Edema management;Passive ROM   Edema Management R knee   Passive ROM R knee extension/flexion                  PT Short Term Goals - 02/02/15 1530    PT SHORT TERM GOAL #1   Title Pt. will be independent with HEP    Status On-going   PT SHORT TERM GOAL #2   Title Pt. will demo 5/5 RLE strength   Status On-going   PT SHORT TERM GOAL #3   Title Pt. will ambulate safey and independently without any device on indoor, outdoor, unlevel, steps   Status On-going           PT Long Term Goals - 02/02/15 1532    PT LONG TERM GOAL #1   Title Pt. will report 0-1/10 knee pain   Status On-going   PT LONG TERM GOAL #2   Title Pt. will have TUG score of 10-12 to reduce fall risk   Status On-going   PT LONG TERM GOAL #3   Title Pt. will have BERG score of greater than or  equal to 52 to reduce fall risk and ambulate safely without device   Status On-going   PT LONG TERM GOAL #4   Title Pt. will have R knee ROM of 0-120 deg   Status On-going               Plan - 02/02/15 1529    Clinical Impression Statement Pt's active knee flexion 110, with significant active extension deficits.  Will continue to benefit from PT to maximize ROM and functional mobility.   PT Next Visit Plan work on extension ROM, dynamic balance/gait and trunk/BUE mvmt   Consulted and Agree with Plan of Care Patient;Family member/caregiver   Family Member Consulted granddaughter        Problem List Patient Active Problem List   Diagnosis Date Noted  . Postoperative anemia due to acute blood loss 01/06/2015  . History of total knee arthroplasty 01/05/2015   Laureen Abrahams, PT, DPT 02/02/2015 4:09 PM  [place] [address] [place], [place], [address] Phone: [phone]   Fax:  [phone]

## 2015-02-04 ENCOUNTER — Ambulatory Visit: Payer: Medicare Other | Admitting: Physical Therapy

## 2015-02-04 ENCOUNTER — Encounter: Payer: Self-pay | Admitting: Physical Therapy

## 2015-02-04 DIAGNOSIS — M25561 Pain in right knee: Secondary | ICD-10-CM

## 2015-02-04 DIAGNOSIS — R29898 Other symptoms and signs involving the musculoskeletal system: Secondary | ICD-10-CM

## 2015-02-04 DIAGNOSIS — M25661 Stiffness of right knee, not elsewhere classified: Secondary | ICD-10-CM

## 2015-02-04 NOTE — Therapy (Signed)
[place], [place], [address] Phone: [phone]   Fax:  [phone]  Physical Therapy Treatment  Patient Details  Name: Anna [name] MRN: [id number] Date of Birth: 11-28-1933 Referring Provider:  Latanya Maudlin, MD  Encounter Date: 02/04/2015      PT End of Session - 02/04/15 1058    Visit Number 4   PT Start Time 1016   PT Stop Time 1115   PT Time Calculation (min) 59 min      Past Medical History  Diagnosis Date  . PONV (postoperative nausea and vomiting)     pt states stays very sleepy for days following anesthesia   . Asthma     hx of in childhood   . Allergy     seasonal also with dogs and cats  . Chronic kidney disease     hx of kidney stones last one pasted 1 month ago   . Bladder infection     hx of   . Arthritis   . Cancer     breast cancer bilat has had double mastectomy   . H/O blood clots     during delivery occurred 50 years ago per left arm   . Fibrocystic breast disease     Past Surgical History  Procedure Laterality Date  . Cesarean section      times 2  . Abdominal hysterectomy    . Dilation and curettage of uterus    . Appendectomy    . Eye surgery      bilat cataract surgery   . Breast surgery      double mastectomy   . Back surgery      disc repaired 20 years ago   . Colonscopy     . Hand surgery      left hand / joint issues with thumb   . 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 Cyst repaired       following mammogram / cyst ruptured had to surgically repair area / left side   . Right knee arthroscopy     . Total knee arthroplasty Right 01/05/2015    Procedure: RIGHT TOTAL KNEE ARTHROPLASTY;  Surgeon: Latanya Maudlin, MD;  Location: [facility];  Service: Orthopedics;  Laterality: Right;    There were no vitals filed for this visit.  Visit Diagnosis:  Knee pain, acute, right  Weakness of right leg  Stiffness  of right knee      Subjective Assessment - 02/04/15 1025    Subjective doing pretty well   Currently in Pain? Yes   Pain Score 2    Pain Location Knee            [facility] PT Assessment - 02/04/15 0001    AROM   AROM Assessment Site Knee   Right/Left Knee Right   Right Knee Extension -13  PROM -7   Right Knee Flexion 115                     [facility] Adult PT Treatment/Exercise - 02/04/15 0001    Knee/Hip Exercises: Aerobic   Isokinetic NuStep Level 5 x 6 min   Knee/Hip Exercises: Machines for Strengthening   Cybex Knee Extension 5# 2 sets 10   Cybex Knee Flexion 15# 2 sets 10   Cybex Leg Press 30# 2 sets 10   Knee/Hip Exercises: Standing  Lateral Step Up Right;1 set;10 reps  4 inch for TKE    Other Standing Knee Exercises black bar heel raises 2 sets 10,  Verb and tactile cuing for posture   Knee/Hip Exercises: Seated   Long Arc Quad Strengthening;Right;2 sets;10 reps  2#   Knee/Hip Exercises: Supine   Terminal Knee Extension Strengthening;Right;2 sets;10 reps  PTA assistance   Manual Therapy   Passive ROM ext CR, PROM and belt mobs                  PT Short Term Goals - 02/04/15 1059    PT SHORT TERM GOAL #1   Title Pt. will be independent with HEP    Status Achieved   PT SHORT TERM GOAL #2   Title Pt. will demo 5/5 RLE strength   Status On-going   PT SHORT TERM GOAL #3   Title Pt. will ambulate safey and independently without any device on indoor, outdoor, unlevel, steps   Status On-going           PT Long Term Goals - 02/02/15 1532    PT LONG TERM GOAL #1   Title Pt. will report 0-1/10 knee pain   Status On-going   PT LONG TERM GOAL #2   Title Pt. will have TUG score of 10-12 to reduce fall risk   Status On-going   PT LONG TERM GOAL #3   Title Pt. will have BERG score of greater than or equal to 52 to reduce fall risk and ambulate safely without device   Status On-going   PT LONG TERM GOAL #4   Title Pt. will have R knee  ROM of 0-120 deg   Status On-going               Plan - 02/04/15 1058    Clinical Impression Statement pt with excellent flex but limited ext tightness and decreased quad activation. needs cuing to activate quads with ther ex   PT Next Visit Plan ext strength and ROM        Problem List Patient Active Problem List   Diagnosis Date Noted  . Postoperative anemia due to acute blood loss 01/06/2015  . History of total knee arthroplasty 01/05/2015    PAYSEUR,ANGIE PTA 02/04/2015, 11:00 AM  [place] [address] [place], [place], [address] Phone: [phone]   Fax:  [phone]

## 2015-02-08 ENCOUNTER — Ambulatory Visit: Payer: Medicare Other | Admitting: Physical Therapy

## 2015-02-08 ENCOUNTER — Encounter: Payer: Self-pay | Admitting: Physical Therapy

## 2015-02-08 DIAGNOSIS — R29898 Other symptoms and signs involving the musculoskeletal system: Secondary | ICD-10-CM

## 2015-02-08 DIAGNOSIS — M25561 Pain in right knee: Secondary | ICD-10-CM

## 2015-02-08 DIAGNOSIS — R269 Unspecified abnormalities of gait and mobility: Secondary | ICD-10-CM

## 2015-02-08 DIAGNOSIS — M25661 Stiffness of right knee, not elsewhere classified: Secondary | ICD-10-CM

## 2015-02-08 NOTE — Therapy (Signed)
[place] [address] [place], [place], [address] Phone: [phone]   Fax:  [phone]  Physical Therapy Treatment  Patient Details  Name: Anna [name] MRN: [id number] Date of Birth: May 26, 1934 Referring Provider:  Latanya Maudlin, MD  Encounter Date: 02/08/2015      PT End of Session - 02/08/15 1507    Visit Number 5   PT Start Time 7408   PT Stop Time 1524   PT Time Calculation (min) 61 min   Activity Tolerance Patient tolerated treatment well      Past Medical History  Diagnosis Date  . PONV (postoperative nausea and vomiting)     pt states stays very sleepy for days following anesthesia   . Asthma     hx of in childhood   . Allergy     seasonal also with dogs and cats  . Chronic kidney disease     hx of kidney stones last one pasted 1 month ago   . Bladder infection     hx of   . Arthritis   . Cancer     breast cancer bilat has had double mastectomy   . H/O blood clots     during delivery occurred 50 years ago per left arm   . Fibrocystic breast disease     Past Surgical History  Procedure Laterality Date  . Cesarean section      times 2  . Abdominal hysterectomy    . Dilation and curettage of uterus    . Appendectomy    . Eye surgery      bilat cataract surgery   . Breast surgery      double mastectomy   . Back surgery      disc repaired 20 years ago   . Colonscopy     . Hand surgery      left hand / joint issues with thumb   . 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 Cyst repaired       following mammogram / cyst ruptured had to surgically repair area / left side   . Right knee arthroscopy     . Total knee arthroplasty Right 01/05/2015    Procedure: RIGHT TOTAL KNEE ARTHROPLASTY;  Surgeon: Latanya Maudlin, MD;  Location: [facility];  Service: Orthopedics;  Laterality: Right;    There were no vitals filed for this visit.  Visit Diagnosis:  Knee  pain, acute, right  Weakness of right leg  Stiffness of right knee  Abnormality of gait      Subjective Assessment - 02/08/15 1439    Subjective I am having less pain, taking less pain medication.  Sleeping better   Currently in Pain? Yes   Pain Score 2    Pain Location Knee   Pain Orientation Right   Pain Descriptors / Indicators Aching   Pain Type Surgical pain                         [facility] Adult PT Treatment/Exercise - 02/08/15 0001    Ambulation/Gait   Gait Comments manual training for BUE reciprocal arm swing, facilitation for trunk/hip rotation, cue to speed up and for longer steps, also did stairs step over step   Knee/Hip Exercises: Aerobic   Isokinetic Nustep Level 4 x6 minutes   Knee/Hip Exercises: Machines for Strengthening   Cybex Knee Extension  5# 3 sets 10   Cybex Knee Flexion 15# 3 sets 10   Cybex Leg Press 30# 2x10   Cryotherapy   Number Minutes Cryotherapy 15 Minutes   Cryotherapy Location Knee   Type of Cryotherapy Ice pack   Electrical Stimulation   Electrical Stimulation Location r knee   Electrical Stimulation Parameters IFC   Electrical Stimulation Goals Edema;Pain   Manual Therapy   Manual Therapy Passive ROM   Passive ROM ext CR, PROM and belt mobs                  PT Short Term Goals - 02/04/15 1059    PT SHORT TERM GOAL #1   Title Pt. will be independent with HEP    Status Achieved   PT SHORT TERM GOAL #2   Title Pt. will demo 5/5 RLE strength   Status On-going   PT SHORT TERM GOAL #3   Title Pt. will ambulate safey and independently without any device on indoor, outdoor, unlevel, steps   Status On-going           PT Long Term Goals - 02/02/15 1532    PT LONG TERM GOAL #1   Title Pt. will report 0-1/10 knee pain   Status On-going   PT LONG TERM GOAL #2   Title Pt. will have TUG score of 10-12 to reduce fall risk   Status On-going   PT LONG TERM GOAL #3   Title Pt. will have BERG score of greater  than or equal to 52 to reduce fall risk and ambulate safely without device   Status On-going   PT LONG TERM GOAL #4   Title Pt. will have R knee ROM of 0-120 deg   Status On-going               Plan - 02/08/15 1508    Clinical Impression Statement Overall doing great, tends to be timid with gait, was able to do stairs step over step today with handrail and HHA   PT Next Visit Plan ext strength and ROM   Consulted and Agree with Plan of Care Patient        Problem List Patient Active Problem List   Diagnosis Date Noted  . Postoperative anemia due to acute blood loss 01/06/2015  . History of total knee arthroplasty 01/05/2015    Sumner Boast., PT 02/08/2015, 3:09 PM  [address] [place], [place], [address] Phone: [phone]   Fax:  [phone]

## 2015-02-10 ENCOUNTER — Ambulatory Visit: Payer: Medicare Other | Admitting: Physical Therapy

## 2015-02-10 ENCOUNTER — Encounter: Payer: Self-pay | Admitting: Physical Therapy

## 2015-02-10 DIAGNOSIS — M25561 Pain in right knee: Secondary | ICD-10-CM

## 2015-02-10 DIAGNOSIS — R29898 Other symptoms and signs involving the musculoskeletal system: Secondary | ICD-10-CM

## 2015-02-10 DIAGNOSIS — R269 Unspecified abnormalities of gait and mobility: Secondary | ICD-10-CM

## 2015-02-10 DIAGNOSIS — M25661 Stiffness of right knee, not elsewhere classified: Secondary | ICD-10-CM

## 2015-02-10 NOTE — Therapy (Signed)
[place], [place], [address] Phone: [phone]   Fax:  [phone]  Physical Therapy Treatment  Patient Details  Name: Anna [name] MRN: [id number] Date of Birth: 1934-01-03 Referring Provider:  Latanya Maudlin, MD  Encounter Date: 02/10/2015      PT End of Session - 02/10/15 1522    Visit Number 6   PT Start Time 1751   PT Stop Time 0258   PT Time Calculation (min) 62 min   Activity Tolerance Patient tolerated treatment well      Past Medical History  Diagnosis Date  . PONV (postoperative nausea and vomiting)     pt states stays very sleepy for days following anesthesia   . Asthma     hx of in childhood   . Allergy     seasonal also with dogs and cats  . Chronic kidney disease     hx of kidney stones last one pasted 1 month ago   . Bladder infection     hx of   . Arthritis   . Cancer     breast cancer bilat has had double mastectomy   . H/O blood clots     during delivery occurred 50 years ago per left arm   . Fibrocystic breast disease     Past Surgical History  Procedure Laterality Date  . Cesarean section      times 2  . Abdominal hysterectomy    . Dilation and curettage of uterus    . Appendectomy    . Eye surgery      bilat cataract surgery   . Breast surgery      double mastectomy   . Back surgery      disc repaired 20 years ago   . Colonscopy     . Hand surgery      left hand / joint issues with thumb   . 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 Cyst repaired       following mammogram / cyst ruptured had to surgically repair area / left side   . Right knee arthroscopy     . Total knee arthroplasty Right 01/05/2015    Procedure: RIGHT TOTAL KNEE ARTHROPLASTY;  Surgeon: Latanya Maudlin, MD;  Location: [facility];  Service: Orthopedics;  Laterality: Right;    There were no vitals filed for this visit.  Visit Diagnosis:  Knee  pain, acute, right  Weakness of right leg  Stiffness of right knee  Abnormality of gait      Subjective Assessment - 02/10/15 1518    Subjective A little sore.  Walking a little better   Currently in Pain? Yes   Pain Score 2    Pain Location Knee   Pain Orientation Right   Pain Descriptors / Indicators Aching   Aggravating Factors  straightening   Pain Relieving Factors rest and ice            [facility] PT Assessment - 02/10/15 0001    AROM   Right Knee Extension 12   Right Knee Flexion 120                     [facility] Adult PT Treatment/Exercise - 02/10/15 0001    Ambulation/Gait   Gait Comments working on larger steps, faster gait and her bending, needing visual and verbal cues   Knee/Hip  Exercises: Aerobic   Stationary Bike 6 min   Isokinetic Nustep Level 4 x6 minutes   Knee/Hip Exercises: Machines for Strengthening   Cybex Knee Extension 5# 3 sets 10   Cybex Knee Flexion 15# 3 sets 10   Cybex Leg Press 30# 2x10   Knee/Hip Exercises: Standing   Other Standing Knee Exercises low load long duration extension stretch, gastroc stretch   Cryotherapy   Number Minutes Cryotherapy 15 Minutes   Cryotherapy Location Knee   Type of Cryotherapy Ice pack   Electrical Stimulation   Electrical Stimulation Location r knee   Electrical Stimulation Parameters IFC   Electrical Stimulation Goals Edema;Pain   Manual Therapy   Manual Therapy Passive ROM   Edema Management R knee   Passive ROM ext CR, PROM and belt mobs                PT Education - 02/10/15 1520    Education provided Yes   Education Details HEP for gastroc stretch and LLLD extension stretch   Person(s) Educated Patient   Methods Explanation;Demonstration;Handout   Comprehension Verbalized understanding;Returned demonstration          PT Short Term Goals - 02/04/15 1059    PT SHORT TERM GOAL #1   Title Pt. will be independent with HEP    Status Achieved   PT SHORT TERM GOAL #2    Title Pt. will demo 5/5 RLE strength   Status On-going   PT SHORT TERM GOAL #3   Title Pt. will ambulate safey and independently without any device on indoor, outdoor, unlevel, steps   Status On-going           PT Long Term Goals - 02/02/15 1532    PT LONG TERM GOAL #1   Title Pt. will report 0-1/10 knee pain   Status On-going   PT LONG TERM GOAL #2   Title Pt. will have TUG score of 10-12 to reduce fall risk   Status On-going   PT LONG TERM GOAL #3   Title Pt. will have BERG score of greater than or equal to 52 to reduce fall risk and ambulate safely without device   Status On-going   PT LONG TERM GOAL #4   Title Pt. will have R knee ROM of 0-120 deg   Status On-going               Plan - 02/10/15 1523    Clinical Impression Statement Having trouble with gait, reports minimal pain and has great ROM, so seems to be a habit   PT Next Visit Plan continue with gait   Consulted and Agree with Plan of Care Patient        Problem List Patient Active Problem List   Diagnosis Date Noted  . Postoperative anemia due to acute blood loss 01/06/2015  . History of total knee arthroplasty 01/05/2015    Sumner Boast., PT 02/10/2015, 3:24 PM  [address] [place], [place], [address] Phone: [phone]   Fax:  [phone]

## 2015-02-14 ENCOUNTER — Ambulatory Visit: Payer: Medicare Other | Admitting: Physical Therapy

## 2015-02-14 DIAGNOSIS — M25661 Stiffness of right knee, not elsewhere classified: Secondary | ICD-10-CM

## 2015-02-14 DIAGNOSIS — R269 Unspecified abnormalities of gait and mobility: Secondary | ICD-10-CM

## 2015-02-14 DIAGNOSIS — M25561 Pain in right knee: Secondary | ICD-10-CM | POA: Diagnosis not present

## 2015-02-14 DIAGNOSIS — R29898 Other symptoms and signs involving the musculoskeletal system: Secondary | ICD-10-CM

## 2015-02-14 NOTE — Therapy (Signed)
[place], [place], [address] Phone: [phone]   Fax:  [phone]  Physical Therapy Treatment  Patient Details  Name: Anna [name] MRN: [id number] Date of Birth: 1934-08-15 Referring Provider:  Latanya Maudlin, MD  Encounter Date: 02/14/2015      PT End of Session - 02/14/15 1703    Visit Number 7   PT Start Time 9211   PT Stop Time 9417   PT Time Calculation (min) 62 min   Activity Tolerance Patient tolerated treatment well   Behavior During Therapy [facility] for tasks assessed/performed      Past Medical History  Diagnosis Date  . PONV (postoperative nausea and vomiting)     pt states stays very sleepy for days following anesthesia   . Asthma     hx of in childhood   . Allergy     seasonal also with dogs and cats  . Chronic kidney disease     hx of kidney stones last one pasted 1 month ago   . Bladder infection     hx of   . Arthritis   . Cancer     breast cancer bilat has had double mastectomy   . H/O blood clots     during delivery occurred 50 years ago per left arm   . Fibrocystic breast disease     Past Surgical History  Procedure Laterality Date  . Cesarean section      times 2  . Abdominal hysterectomy    . Dilation and curettage of uterus    . Appendectomy    . Eye surgery      bilat cataract surgery   . Breast surgery      double mastectomy   . Back surgery      disc repaired 20 years ago   . Colonscopy     . Hand surgery      left hand / joint issues with thumb   . 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 Cyst repaired       following mammogram / cyst ruptured had to surgically repair area / left side   . Right knee arthroscopy     . Total knee arthroplasty Right 01/05/2015    Procedure: RIGHT TOTAL KNEE ARTHROPLASTY;  Surgeon: Latanya Maudlin, MD;  Location: [facility];  Service: Orthopedics;  Laterality: Right;    There were  no vitals filed for this visit.  Visit Diagnosis:  Knee pain, acute, right  Weakness of right leg  Stiffness of right knee  Abnormality of gait      Subjective Assessment - 02/14/15 1614    Subjective Slight pain in knee; overall doing well. "doing a little better every day."   Currently in Pain? Yes   Pain Score 2    Pain Location Knee   Pain Orientation Right   Pain Descriptors / Indicators Aching   Pain Type Surgical pain   Pain Frequency Constant   Aggravating Factors  extension   Pain Relieving Factors rest and ice                         [facility] Adult PT Treatment/Exercise - 02/14/15 1615    Ambulation/Gait   Stairs Yes   Stairs Assistance 4: Min guard   Stair Management Technique No rails;One rail Left;Step to pattern   Number  of Stairs 15   Height of Stairs 8   Gait Comments amb without device with focus on increased step length and heel strike; exaggerated gait with increased hip flexion and knee ext for balance and improved gait mechanics with min A; stairs without rail descending with LLE leading and minguard A; ascended stairs without rail with minguard A and min cues for  technique   Knee/Hip Exercises: Aerobic   Stationary Bike 6 min   Isokinetic Nustep Level 5 x6 minutes   Knee/Hip Exercises: Machines for Strengthening   Cybex Knee Extension 5# 3 sets 10   Cybex Knee Flexion 15# 3 sets 10   Cybex Leg Press 30# 3x10; calf raises 30# 3x10   Cryotherapy   Number Minutes Cryotherapy 15 Minutes   Cryotherapy Location Knee   Type of Cryotherapy Ice pack   Electrical Stimulation   Electrical Stimulation Location R knee   Electrical Stimulation Action IFC   Electrical Stimulation Parameters to tolerance x 15 min   Electrical Stimulation Goals Edema;Pain   Manual Therapy   Manual Therapy Joint mobilization   Joint Mobilization AP femur glides grade 2-3 for knee extension                  PT Short Term Goals - 02/04/15 1059    PT  SHORT TERM GOAL #1   Title Pt. will be independent with HEP    Status Achieved   PT SHORT TERM GOAL #2   Title Pt. will demo 5/5 RLE strength   Status On-going   PT SHORT TERM GOAL #3   Title Pt. will ambulate safey and independently without any device on indoor, outdoor, unlevel, steps   Status On-going           PT Long Term Goals - 02/02/15 1532    PT LONG TERM GOAL #1   Title Pt. will report 0-1/10 knee pain   Status On-going   PT LONG TERM GOAL #2   Title Pt. will have TUG score of 10-12 to reduce fall risk   Status On-going   PT LONG TERM GOAL #3   Title Pt. will have BERG score of greater than or equal to 52 to reduce fall risk and ambulate safely without device   Status On-going   PT LONG TERM GOAL #4   Title Pt. will have R knee ROM of 0-120 deg   Status On-going               Plan - 02/14/15 1703    Clinical Impression Statement Gait continues to improve; decreased terminal knee extension affecting heel strike.  Will continue to benefit from PT to maximize function.   PT Next Visit Plan continue with gait   Consulted and Agree with Plan of Care Patient        Problem List Patient Active Problem List   Diagnosis Date Noted  . Postoperative anemia due to acute blood loss 01/06/2015  . History of total knee arthroplasty 01/05/2015   Laureen Abrahams, PT, DPT 02/14/2015 5:18 PM  [address] [place], [place], [address] Phone: [phone]   Fax:  [phone]

## 2015-02-16 ENCOUNTER — Ambulatory Visit: Payer: Medicare Other | Admitting: Physical Therapy

## 2015-02-16 DIAGNOSIS — M25561 Pain in right knee: Secondary | ICD-10-CM | POA: Diagnosis not present

## 2015-02-16 DIAGNOSIS — R269 Unspecified abnormalities of gait and mobility: Secondary | ICD-10-CM

## 2015-02-16 DIAGNOSIS — R29898 Other symptoms and signs involving the musculoskeletal system: Secondary | ICD-10-CM

## 2015-02-16 DIAGNOSIS — M25661 Stiffness of right knee, not elsewhere classified: Secondary | ICD-10-CM

## 2015-02-16 NOTE — Therapy (Signed)
[place], [place], [address] Phone: [phone]   Fax:  [phone]  Physical Therapy Treatment  Patient Details  Name: AUSTIN PONGRATZ MRN: [id number] Date of Birth: [birth year] Referring Provider:  Latanya Maudlin, MD  Encounter Date: 02/16/2015      PT End of Session - 02/16/15 1603    Visit Number 8   PT Start Time 1497   PT Stop Time 0263   PT Time Calculation (min) 47 min   Activity Tolerance Patient tolerated treatment well   Behavior During Therapy [facility] for tasks assessed/performed      Past Medical History  Diagnosis Date  . PONV (postoperative nausea and vomiting)     pt states stays very sleepy for days following anesthesia   . Asthma     hx of in childhood   . Allergy     seasonal also with dogs and cats  . Chronic kidney disease     hx of kidney stones last one pasted 1 month ago   . Bladder infection     hx of   . Arthritis   . Cancer     breast cancer bilat has had double mastectomy   . H/O blood clots     during delivery occurred 50 years ago per left arm   . Fibrocystic breast disease     Past Surgical History  Procedure Laterality Date  . Cesarean section      times 2  . Abdominal hysterectomy    . Dilation and curettage of uterus    . Appendectomy    . Eye surgery      bilat cataract surgery   . Breast surgery      double mastectomy   . Back surgery      disc repaired 20 years ago   . Colonscopy     . Hand surgery      left hand / joint issues with thumb   . 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 Cyst repaired       following mammogram / cyst ruptured had to surgically repair area / left side   . Right knee arthroscopy     . Total knee arthroplasty Right 01/05/2015    Procedure: RIGHT TOTAL KNEE ARTHROPLASTY;  Surgeon: Latanya Maudlin, MD;  Location: [facility];  Service: Orthopedics;  Laterality: Right;    There were  no vitals filed for this visit.  Visit Diagnosis:  Knee pain, acute, right  Weakness of right leg  Stiffness of right knee  Abnormality of gait      Subjective Assessment - 02/16/15 1535    Subjective "maybe a 2" referring to knee pain   Currently in Pain? Yes   Pain Score 2    Pain Location Knee   Pain Orientation Right   Pain Descriptors / Indicators Aching   Pain Type Surgical pain   Pain Frequency Constant                         [facility] Adult PT Treatment/Exercise - 02/16/15 1536    Knee/Hip Exercises: Aerobic   Stationary Bike 6 min   Knee/Hip Exercises: Machines for Strengthening   Cybex Knee Extension 10# 3x10   Cybex Knee Flexion 20# 3x10   Cybex Leg Press 30# 3x10; calf raises 30# 3x10   Knee/Hip Exercises: Standing  Forward Step Up Right;10 reps;2 sets;Step Height: 6"   Forward Step Up Limitations cues for full R knee/hip ext    Step Down Right;10 reps;2 sets;Step Height: 6"   Step Down Limitations 1 UE support x 10; no UE support x 10   Knee/Hip Exercises: Seated   Other Seated Knee/Hip Exercises hip ext and abdct with red tband x 15 bil                  PT Short Term Goals - 02/04/15 1059    PT SHORT TERM GOAL #1   Title Pt. will be independent with HEP    Status Achieved   PT SHORT TERM GOAL #2   Title Pt. will demo 5/5 RLE strength   Status On-going   PT SHORT TERM GOAL #3   Title Pt. will ambulate safey and independently without any device on indoor, outdoor, unlevel, steps   Status On-going           PT Long Term Goals - 02/02/15 1532    PT LONG TERM GOAL #1   Title Pt. will report 0-1/10 knee pain   Status On-going   PT LONG TERM GOAL #2   Title Pt. will have TUG score of 10-12 to reduce fall risk   Status On-going   PT LONG TERM GOAL #3   Title Pt. will have BERG score of greater than or equal to 52 to reduce fall risk and ambulate safely without device   Status On-going   PT LONG TERM GOAL #4   Title Pt.  will have R knee ROM of 0-120 deg   Status On-going               Plan - 02/16/15 1603    PT Next Visit Plan continue with gait; hip and knee strengthening   Consulted and Agree with Plan of Care Patient        Problem List Patient Active Problem List   Diagnosis Date Noted  . Postoperative anemia due to acute blood loss 01/06/2015  . History of total knee arthroplasty 01/05/2015   Laureen Abrahams, PT, DPT 02/16/2015 5:12 PM  [address] [place], [place], [address] Phone: [phone]   Fax:  [phone]

## 2015-02-22 ENCOUNTER — Encounter: Payer: Self-pay | Admitting: Physical Therapy

## 2015-02-22 ENCOUNTER — Ambulatory Visit: Payer: Medicare Other | Admitting: Physical Therapy

## 2015-02-22 DIAGNOSIS — R29898 Other symptoms and signs involving the musculoskeletal system: Secondary | ICD-10-CM

## 2015-02-22 DIAGNOSIS — M25561 Pain in right knee: Secondary | ICD-10-CM | POA: Diagnosis not present

## 2015-02-22 NOTE — Therapy (Signed)
[place], [place], [address] Phone: [phone]   Fax:  [phone]  Physical Therapy Treatment  Patient Details  Name: KERRY ODONOHUE MRN: [id number] Date of Birth: 08-25-1934 Referring Provider:  Latanya Maudlin, MD  Encounter Date: 02/22/2015      PT End of Session - 02/22/15 1343    Visit Number 9   PT Start Time 1245   PT Stop Time 1357   PT Time Calculation (min) 51 min   Activity Tolerance Patient tolerated treatment well   Behavior During Therapy [facility] for tasks assessed/performed      Past Medical History  Diagnosis Date  . PONV (postoperative nausea and vomiting)     pt states stays very sleepy for days following anesthesia   . Asthma     hx of in childhood   . Allergy     seasonal also with dogs and cats  . Chronic kidney disease     hx of kidney stones last one pasted 1 month ago   . Bladder infection     hx of   . Arthritis   . Cancer     breast cancer bilat has had double mastectomy   . H/O blood clots     during delivery occurred 50 years ago per left arm   . Fibrocystic breast disease     Past Surgical History  Procedure Laterality Date  . Cesarean section      times 2  . Abdominal hysterectomy    . Dilation and curettage of uterus    . Appendectomy    . Eye surgery      bilat cataract surgery   . Breast surgery      double mastectomy   . Back surgery      disc repaired 20 years ago   . Colonscopy     . Hand surgery      left hand / joint issues with thumb   . 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 Cyst repaired       following mammogram / cyst ruptured had to surgically repair area / left side   . Right knee arthroscopy     . Total knee arthroplasty Right 01/05/2015    Procedure: RIGHT TOTAL KNEE ARTHROPLASTY;  Surgeon: Latanya Maudlin, MD;  Location: [facility];  Service: Orthopedics;  Laterality: Right;    There were  no vitals filed for this visit.  Visit Diagnosis:  Knee pain, acute, right  Weakness of right leg      Subjective Assessment - 02/22/15 1308    Subjective Pt reports no new issues, Pt reports that she is doing fine    Currently in Pain? Yes   Pain Score 3    Pain Location Knee   Pain Orientation Right   Pain Descriptors / Indicators Sharp   Pain Type Surgical pain                         [facility] Adult PT Treatment/Exercise - 02/22/15 0001    Ambulation/Gait   Stairs Yes   Stairs Assistance 5: Supervision   Stair Management Technique One rail Right   Number of Stairs 15   Height of Stairs 6   Knee/Hip Exercises: Aerobic   Stationary Bike 6 min   Knee/Hip Exercises: Machines for Strengthening   Cybex Knee Extension  10# 3x10   Cybex Knee Flexion 20# 3x10   Cybex Leg Press 20# 3x10   Knee/Hip Exercises: Standing   Forward Step Up Right;10 reps;2 sets;Step Height: 6"   Forward Step Up Limitations cues for full R knee/hip ext    Other Standing Knee Exercises heel raises 3 sets 10 reps, Standing hip EXT & ABD red Tband 3 sets 10 reps    Other Standing Knee Exercises Sit to stand on airex 5 reps with UE 5 reps without 2 sets    Cryotherapy   Number Minutes Cryotherapy 15 Minutes   Cryotherapy Location Knee   Type of Cryotherapy Ice pack   Electrical Stimulation   Electrical Stimulation Location R knee   Electrical Stimulation Action IFC   Electrical Stimulation Parameters to tolerance    Electrical Stimulation Goals Edema;Pain                  PT Short Term Goals - 02/22/15 1346    PT SHORT TERM GOAL #3   Title Pt. will ambulate safey and independently without any device on indoor, outdoor, unlevel, steps   Status Achieved           PT Long Term Goals - 02/02/15 1532    PT LONG TERM GOAL #1   Title Pt. will report 0-1/10 knee pain   Status On-going   PT LONG TERM GOAL #2   Title Pt. will have TUG score of 10-12 to reduce fall risk    Status On-going   PT LONG TERM GOAL #3   Title Pt. will have BERG score of greater than or equal to 52 to reduce fall risk and ambulate safely without device   Status On-going   PT LONG TERM GOAL #4   Title Pt. will have R knee ROM of 0-120 deg   Status On-going               Plan - 02/22/15 1344    Clinical Impression Statement Pt 6 minutes late for PT treatment. Pt tolerated treatment well and continues to make progress towards goals.   Pt will benefit from skilled therapeutic intervention in order to improve on the following deficits Abnormal gait;Decreased range of motion;Difficulty walking;Pain;Decreased strength;Increased edema   Rehab Potential Good   PT Frequency 1x / week  Spoke to leading PT to decrease frequency    PT Duration 8 weeks   PT Treatment/Interventions Patient/family education;Functional mobility training;ADLs/Self Care Home Management;Therapeutic activities;Therapeutic exercise;Cryotherapy;Occupational psychologist   PT Next Visit Plan continue with gait; hip and knee strengthening, access for d/c   Consulted and Agree with Plan of Care Patient        Problem List Patient Active Problem List   Diagnosis Date Noted  . Postoperative anemia due to acute blood loss 01/06/2015  . History of total knee arthroplasty 01/05/2015    Willey Blade 02/22/2015, 1:47 PM  [address] [place], [place], [address] Phone: [phone]   Fax:  [phone]

## 2015-02-24 ENCOUNTER — Encounter: Payer: Self-pay | Admitting: Physical Therapy

## 2015-02-24 ENCOUNTER — Ambulatory Visit: Payer: Medicare Other | Admitting: Physical Therapy

## 2015-02-24 DIAGNOSIS — M25561 Pain in right knee: Secondary | ICD-10-CM | POA: Diagnosis not present

## 2015-02-24 DIAGNOSIS — M25661 Stiffness of right knee, not elsewhere classified: Secondary | ICD-10-CM

## 2015-02-24 DIAGNOSIS — R29898 Other symptoms and signs involving the musculoskeletal system: Secondary | ICD-10-CM

## 2015-02-24 NOTE — Therapy (Signed)
[place], [place], [address] Phone: [phone]   Fax:  [phone]  Physical Therapy Treatment  Patient Details  Name: TYHESHA DUTSON MRN: [id number] Date of Birth: [birth year] Referring Provider:  Latanya Maudlin, MD  Encounter Date: 02/24/2015      PT End of Session - 02/24/15 1557    Visit Number 10   PT Start Time 7591   PT Stop Time 1612   PT Time Calculation (min) 46 min   Activity Tolerance Patient tolerated treatment well   Behavior During Therapy [facility] for tasks assessed/performed      Past Medical History  Diagnosis Date  . PONV (postoperative nausea and vomiting)     pt states stays very sleepy for days following anesthesia   . Asthma     hx of in childhood   . Allergy     seasonal also with dogs and cats  . Chronic kidney disease     hx of kidney stones last one pasted 1 month ago   . Bladder infection     hx of   . Arthritis   . Cancer     breast cancer bilat has had double mastectomy   . H/O blood clots     during delivery occurred 50 years ago per left arm   . Fibrocystic breast disease     Past Surgical History  Procedure Laterality Date  . Cesarean section      times 2  . Abdominal hysterectomy    . Dilation and curettage of uterus    . Appendectomy    . Eye surgery      bilat cataract surgery   . Breast surgery      double mastectomy   . Back surgery      disc repaired 20 years ago   . Colonscopy     . Hand surgery      left hand / joint issues with thumb   . 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 Cyst repaired       following mammogram / cyst ruptured had to surgically repair area / left side   . Right knee arthroscopy     . Total knee arthroplasty Right 01/05/2015    Procedure: RIGHT TOTAL KNEE ARTHROPLASTY;  Surgeon: Latanya Maudlin, MD;  Location: [facility];  Service: Orthopedics;  Laterality: Right;    There were  no vitals filed for this visit.  Visit Diagnosis:  Weakness of right leg  Knee pain, acute, right  Stiffness of right knee      Subjective Assessment - 02/24/15 1527    Subjective Im all right, just ate a chocolate bar my energy is low. Pt reports no new changes from last session   Currently in Pain? No/denies   Pain Score 0-No pain                         [facility] Adult PT Treatment/Exercise - 02/24/15 0001    Ambulation/Gait   Stairs Yes   Stairs Assistance 5: Supervision   Stair Management Technique One rail Right   Number of Stairs 15   Height of Stairs 6   Gait Comments 2 sets    Knee/Hip Exercises: Aerobic   Isokinetic Nustep Level 5 x6 minutes   Knee/Hip Exercises: Machines for Strengthening   Cybex Knee Extension 10# 3x10  Cybex Knee Flexion 20# 3x10   Cybex Leg Press 20# 3x10   Knee/Hip Exercises: Standing   Forward Step Up Right;10 reps;2 sets;Step Height: 6"   Forward Step Up Limitations Cues for posture    Step Down Right;10 reps;2 sets;Step Height: 6"   Step Down Limitations UE support    Other Standing Knee Exercises heel raises 3 sets 10 reps, Standing hip EXT & ABD red Tband 3 sets 10 reps    Other Standing Knee Exercises Sit to stand on airex 5 reps with UE 5 reps without 2 sets    Cryotherapy   Number Minutes Cryotherapy 15 Minutes   Cryotherapy Location Knee   Type of Cryotherapy Ice pack   Electrical Stimulation   Electrical Stimulation Location R knee   Electrical Stimulation Action IFC   Electrical Stimulation Parameters to tolerance   Electrical Stimulation Goals Pain                  PT Short Term Goals - 02/22/15 1346    PT SHORT TERM GOAL #3   Title Pt. will ambulate safey and independently without any device on indoor, outdoor, unlevel, steps   Status Achieved           PT Long Term Goals - 02/24/15 1559    PT LONG TERM GOAL #1   Title Pt. will report 0-1/10 knee pain   Status Achieved                Plan - 02/24/15 1558    Clinical Impression Statement Pt 10 minutes late for PT session. Pt tolerated treatment well.    Pt will benefit from skilled therapeutic intervention in order to improve on the following deficits Abnormal gait;Decreased range of motion;Difficulty walking;Pain;Decreased strength;Increased edema   Rehab Potential Good   PT Treatment/Interventions Patient/family education;Functional mobility training;ADLs/Self Care Home Management;Therapeutic activities;Therapeutic exercise;Cryotherapy;Electrical Stimulation;Gait training;Stair training   PT Next Visit Plan access for d/c        Problem List Patient Active Problem List   Diagnosis Date Noted  . Postoperative anemia due to acute blood loss 01/06/2015  . History of total knee arthroplasty 01/05/2015    Scot Jun, PTA 02/24/2015, 4:00 PM  [place], [place], [address] Phone: [phone]   Fax:  [phone]

## 2015-03-02 ENCOUNTER — Encounter: Payer: Self-pay | Admitting: Physical Therapy

## 2015-03-02 ENCOUNTER — Ambulatory Visit: Payer: Medicare Other | Attending: Orthopedic Surgery | Admitting: Physical Therapy

## 2015-03-02 DIAGNOSIS — M25661 Stiffness of right knee, not elsewhere classified: Secondary | ICD-10-CM | POA: Diagnosis present

## 2015-03-02 DIAGNOSIS — M25561 Pain in right knee: Secondary | ICD-10-CM | POA: Insufficient documentation

## 2015-03-02 DIAGNOSIS — R29898 Other symptoms and signs involving the musculoskeletal system: Secondary | ICD-10-CM | POA: Insufficient documentation

## 2015-03-02 NOTE — Therapy (Signed)
[place], [place], [address] Phone: [phone]   Fax:  [phone]  Physical Therapy Treatment  Patient Details  Name: Anna [name] MRN: [id number] Date of Birth: Jul 27, 1934 Referring Provider:  Latanya Maudlin, MD  Encounter Date: 03/02/2015      PT End of Session - 03/02/15 1140    Visit Number 11   PT Start Time 1100   PT Stop Time 1140   PT Time Calculation (min) 40 min   Activity Tolerance Patient tolerated treatment well   Behavior During Therapy [facility] for tasks assessed/performed      Past Medical History  Diagnosis Date  . PONV (postoperative nausea and vomiting)     pt states stays very sleepy for days following anesthesia   . Asthma     hx of in childhood   . Allergy     seasonal also with dogs and cats  . Chronic kidney disease     hx of kidney stones last one pasted 1 month ago   . Bladder infection     hx of   . Arthritis   . Cancer     breast cancer bilat has had double mastectomy   . H/O blood clots     during delivery occurred 50 years ago per left arm   . Fibrocystic breast disease     Past Surgical History  Procedure Laterality Date  . Cesarean section      times 2  . Abdominal hysterectomy    . Dilation and curettage of uterus    . Appendectomy    . Eye surgery      bilat cataract surgery   . Breast surgery      double mastectomy   . Back surgery      disc repaired 20 years ago   . Colonscopy     . Hand surgery      left hand / joint issues with thumb   . 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 Cyst repaired       following mammogram / cyst ruptured had to surgically repair area / left side   . Right knee arthroscopy     . Total knee arthroplasty Right 01/05/2015    Procedure: RIGHT TOTAL KNEE ARTHROPLASTY;  Surgeon: Latanya Maudlin, MD;  Location: [facility];  Service: Orthopedics;  Laterality: Right;    There were  no vitals filed for this visit.  Visit Diagnosis:  Weakness of right leg  Knee pain, acute, right  Stiffness of right knee      Subjective Assessment - 03/02/15 1059    Subjective "Im doing well I think" Pt reports no changes    Currently in Pain? Yes   Pain Score 1    Pain Location Knee   Pain Orientation Right   Pain Descriptors / Indicators Dull   Pain Type Surgical pain            [facility] PT Assessment - 03/02/15 0001    AROM   AROM Assessment Site Knee   Right/Left Knee Right   Right Knee Extension 10   Right Knee Flexion 120   Strength   Right Knee Flexion 5/5   Right Knee Extension 5/5   Timed Up and Go Test   TUG Normal TUG   Normal TUG (seconds) 11.87  [name] has been seen x 1 month PT following R TKA by Dr Gladstone Lighter and is now ready for D/C.    ROM is 0-120 R knee.   Strength is 5/5 R knee.   She is ambulating without any assistive device without any difficulty or loss of balance.   Her timed up and go test is WNL for age group.   The only remaining issue is min edema around the R knee.    She does not like ice, but is advised to continue to use it.    Also recommended consider mild compression stocking vs. Pull-on knee sleeve or ace wrap to decrease the edema.     Otherwise she feels 100% improved, and that her surgery was a success.     She is to continue her home exercises and ice program and call us with any further questions.    She verbalizes understanding and agreement.          [facility] Adult PT Treatment/Exercise - 03/02/15 0001    Ambulation/Gait   Stairs Yes   Stairs Assistance 6: Modified independent (Device/Increase time)   Stair Management Technique One rail Right;One rail Left   Number of Stairs 15   Height of Stairs 6   Gait Comments 3 sets    Knee/Hip Exercises: Aerobic   Stationary Bike 8 min   Knee/Hip Exercises: Machines for Strengthening   Cybex Leg Press 30# 3x10   Knee/Hip Exercises: Standing   Forward Step Up  Right;10 reps;2 sets;Step Height: 6"  From airex to box   Forward Step Up Limitations cues for sequecing up with R down with L    Step Down Right;10 reps;2 sets;Step Height: 6"   Step Down Limitations No UE support    Other Standing Knee Exercises heel raises 2 sets 15 reps on airex      Other Standing Knee Exercises Sit to stand on airex 10 reps  2 sets no ue supporyt                    PT Short Term Goals - 03/02/15 1141    PT SHORT TERM GOAL #2   Title Pt. will demo 5/5 RLE strength   Status Achieved           PT Long Term Goals - 03/02/15 1142    PT LONG TERM GOAL #2   Title Pt. will have TUG score of 10-12 to reduce fall risk   Status Achieved   PT LONG TERM GOAL #4   Title Pt. will have R knee ROM of 0-120 deg   Status Partially Met               Plan - 03/02/15 1140    Clinical Impression Statement Pt tolerated treatment well and has met some PT goals. Pt reports that she feel like she no longer needs PT.    Pt will benefit from skilled therapeutic intervention in order to improve on the following deficits Abnormal gait;Decreased range of motion;Difficulty walking;Pain;Decreased strength;Increased edema   Rehab Potential Good   PT Frequency 1x / week   PT Duration 8 weeks   PT Treatment/Interventions Patient/family education;Functional mobility training;ADLs/Self Care Home Management;Therapeutic activities;Therapeutic exercise;Cryotherapy;Electrical Stimulation;Gait training;Stair training   PT Next Visit Plan access for d/c        Problem List Patient Active Problem List   Diagnosis Date Noted  . Postoperative anemia due to acute blood loss 01/06/2015  . History of total knee arthroplasty  01/05/2015    [name], PT 03/02/2015, 11:50 AM  [place], [place], [address] Phone: [phone]   Fax:  [phone]

## 2015-03-02 NOTE — Therapy (Signed)
[facility] [address] [place], [place], [address] Phone: [phone]   Fax:  [phone]  Physical Therapy Treatment  Patient Details  Name: Anna [name] MRN: [id number] Date of Birth: 05/23/1934 Referring Provider:  Gioffre, Ronald, MD  Encounter Date: 03/02/2015      PT End of Session - 03/02/15 1140    Visit Number 11   PT Start Time 1100   PT Stop Time 1140   PT Time Calculation (min) 40 min   Activity Tolerance Patient tolerated treatment well   Behavior During Therapy [facility] for tasks assessed/performed      Past Medical History  Diagnosis Date  . PONV (postoperative nausea and vomiting)     pt states stays very sleepy for days following anesthesia   . Asthma     hx of in childhood   . Allergy     seasonal also with dogs and cats  . Chronic kidney disease     hx of kidney stones last one pasted 1 month ago   . Bladder infection     hx of   . Arthritis   . Cancer     breast cancer bilat has had double mastectomy   . H/O blood clots     during delivery occurred 50 years ago per left arm   . Fibrocystic breast disease     Past Surgical History  Procedure Laterality Date  . Cesarean section      times 2  . Abdominal hysterectomy    . Dilation and curettage of uterus    . Appendectomy    . Eye surgery      bilat cataract surgery   . Breast surgery      double mastectomy   . Back surgery      disc repaired 20 years ago   . Colonscopy     . Hand surgery      left hand / joint issues with thumb   . 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 Cyst repaired       following mammogram / cyst ruptured had to surgically repair area / left side   . Right knee arthroscopy     . Total knee arthroplasty Right 01/05/2015    Procedure: RIGHT TOTAL KNEE ARTHROPLASTY;  Surgeon: Ronald Gioffre, MD;  Location: [facility];  Service: Orthopedics;  Laterality: Right;    There were  no vitals filed for this visit.  Visit Diagnosis:  Weakness of right leg  Knee pain, acute, right  Stiffness of right knee      Subjective Assessment - 03/02/15 1059    Subjective "Im doing well I think" Pt reports no changes    Currently in Pain? Yes   Pain Score 1    Pain Location Knee   Pain Orientation Right   Pain Descriptors / Indicators Dull   Pain Type Surgical pain            [facility] PT Assessment - 03/02/15 0001    AROM   AROM Assessment Site Knee   Right/Left Knee Right   Right Knee Extension 10   Right Knee Flexion 120   Strength   Right Knee Flexion 5/5   Right Knee Extension 5/5   Timed Up and Go Test   TUG Normal TUG   Normal TUG (seconds) 11.87                       [facility] Adult PT Treatment/Exercise - 03/02/15 0001    Ambulation/Gait   Stairs Yes   Stairs Assistance 6: Modified independent (Device/Increase time)   Stair Management Technique One rail Right;One rail Left   Number of Stairs 15   Height of Stairs 6   Gait Comments 3 sets    Knee/Hip Exercises: Aerobic   Stationary Bike 8 min   Knee/Hip Exercises: Machines for Strengthening   Cybex Leg Press 30# 3x10   Knee/Hip Exercises: Standing   Forward Step Up Right;10 reps;2 sets;Step Height: 6"  From airex to box   Forward Step Up Limitations cues for sequencing up with R down with L    Step Down Right;10 reps;2 sets;Step Height: 6"   Step Down Limitations No UE support    Other Standing Knee Exercises heel raises 2 sets 15 reps on airex      Other Standing Knee Exercises Sit to stand on airex 10 reps  2 sets no ue support                    PT Short Term Goals - 03/02/15 1141    PT SHORT TERM GOAL #2   Title Pt. will demo 5/5 RLE strength   Status Achieved           PT Long Term Goals - 03/02/15 1142    PT LONG TERM GOAL #2   Title Pt. will have TUG score of 10-12 to reduce fall risk   Status Achieved   PT LONG TERM GOAL #4   Title Pt. will have R knee  ROM of 0-120 deg   Status Partially Met               Plan - 03/02/15 1140    Clinical Impression Statement Pt tolerated treatment well and has met some PT goals. Pt reports that she feel like she no longer needs PT.    Pt will benefit from skilled therapeutic intervention in order to improve on the following deficits Abnormal gait;Decreased range of motion;Difficulty walking;Pain;Decreased strength;Increased edema   Rehab Potential Good   PT Frequency 1x / week   PT Duration 8 weeks   PT Treatment/Interventions Patient/family education;Functional mobility training;ADLs/Self Care Home Management;Therapeutic activities;Therapeutic exercise;Cryotherapy;Electrical Stimulation;Gait training;Stair training   PT Next Visit Plan access for d/c        Problem List Patient Active Problem List   Diagnosis Date Noted  . Postoperative anemia due to acute blood loss 01/06/2015  . History of total knee arthroplasty 01/05/2015    Ronald G Pemberton, PTA 03/02/2015, 11:43 AM  [facility] [address] [place], [place], [address] Phone: [phone]   Fax:  [phone]      

## 2015-03-29 DIAGNOSIS — N200 Calculus of kidney: Secondary | ICD-10-CM | POA: Insufficient documentation

## 2016-01-25 DIAGNOSIS — G609 Hereditary and idiopathic neuropathy, unspecified: Secondary | ICD-10-CM | POA: Insufficient documentation

## 2018-08-07 DIAGNOSIS — H905 Unspecified sensorineural hearing loss: Secondary | ICD-10-CM | POA: Insufficient documentation

## 2019-04-09 ENCOUNTER — Encounter (HOSPITAL_BASED_OUTPATIENT_CLINIC_OR_DEPARTMENT_OTHER): Payer: Medicare Other

## 2019-11-02 DIAGNOSIS — G4485 Primary stabbing headache: Secondary | ICD-10-CM | POA: Insufficient documentation

## 2019-12-07 DIAGNOSIS — G40209 Localization-related (focal) (partial) symptomatic epilepsy and epileptic syndromes with complex partial seizures, not intractable, without status epilepticus: Secondary | ICD-10-CM | POA: Insufficient documentation

## 2019-12-07 DIAGNOSIS — G8929 Other chronic pain: Secondary | ICD-10-CM | POA: Insufficient documentation

## 2019-12-24 DIAGNOSIS — M5136 Other intervertebral disc degeneration, lumbar region: Secondary | ICD-10-CM | POA: Insufficient documentation

## 2019-12-24 DIAGNOSIS — M51369 Other intervertebral disc degeneration, lumbar region without mention of lumbar back pain or lower extremity pain: Secondary | ICD-10-CM | POA: Insufficient documentation

## 2020-08-24 ENCOUNTER — Encounter: Payer: Self-pay | Admitting: Neurology

## 2020-09-06 ENCOUNTER — Encounter: Payer: Self-pay | Admitting: Neurology

## 2020-09-06 ENCOUNTER — Ambulatory Visit (INDEPENDENT_AMBULATORY_CARE_PROVIDER_SITE_OTHER): Payer: Medicare Other | Admitting: Neurology

## 2020-09-06 ENCOUNTER — Other Ambulatory Visit: Payer: Self-pay

## 2020-09-06 VITALS — BP 165/76 | HR 102 | Ht 60.0 in | Wt 125.6 lb

## 2020-09-06 DIAGNOSIS — R29818 Other symptoms and signs involving the nervous system: Secondary | ICD-10-CM

## 2020-09-06 NOTE — Patient Instructions (Signed)
Good to meet you!  1. Schedule 72-hour EEG  2. Reduce Lamictal 25mg : Take 3 tabs in AM, 4 tabs in PM  3. Follow-up after EEG, call for any changes

## 2020-09-06 NOTE — Progress Notes (Signed)
NEUROLOGY CONSULTATION NOTE  Anna [name] MRN: [id number] DOB: 30-Nov-1933  Referring provider: Dr. Cathlean Sauer Primary care provider: Dr. Cathlean Sauer  Reason for consult:  seizures  Dear Dr Lorne Skeens:  Thank you for your kind referral of Anna [name] for consultation of the above symptoms. Although her history is well known to you, please allow me to reiterate it for the purpose of our medical record. The patient was accompanied to the clinic by her granddaughter Anna [name] who also provides collateral information. Records and images were personally reviewed where available.   HISTORY OF PRESENT ILLNESS: This is a pleasant 87 year old right-handed woman with a history of breast cancer presenting for second opinion regarding seizures. Her granddaughter Anna [name] is present to provide additional information. Symptoms started a year ago, they all occur when she is asleep.The first event occurred while she was taking a nap, all of a sudden it felt like she was hit on the left temple. This woke her up, it did not last for just a second, then there was no lingering pain, no dizziness, focal numbness/tingling/weakness. The next episode occurred again while napping, she then felt like she was hit on the frontal region, again waking her up. Several months later at midnight while asleep on her lounge chair, she states she shook all over, the chair shook, waking her up. She did not see her body shaking when she woke up. She had an MRI brain in 09/2019 which did not show any acute changes, there was mild diffuse volume loss, right mastoid effusion, left frontal and anterior ethmoid sinus opacification. She was evaluated by neurologist Dr. Macario Carls in 10/2019 and had an EEG in 11/2019 which was abnormal, reporting brief bursts of bilateral temporal sharp theta waves in drowsiness and rare sharp waves in the left mid-temporal are with brief bursts of low voltage delta waves. She was started on Levetiracetam which  made her dizzy. She was started on Lamotrigine in 12/2019 with doses increased over the next few months due to continued episodes where she would describe nocturnal sensations that wake her up from sleep. She has difficulty describing them, but states that they are always on the right side, from her shoulder going down, or on her right knee, or ankle. She states it is not a numbness or tingling, no pain, just a sensation. They occur every week or two with no clear triggers. They only last but a minute, always waking her from sleep. No tongue bite or incontinence. None during wakefulness. She had increased Lamotrigine to 100mg  BID in November with no change in these episodes. She denies any headaches, vision changes, staring/unresponsive episodes, loss of time, olfactory/gustatory hallucinations, myoclonic jerks. She has always had a little pain in her neck and back pain. She wears hearing aids but never had any clarity in her right ear. Sleep is good. She does not snore, she takes a nap during the day but does not feel drowsy. Anna [name] has noticed her gait has worsened, she has been dealing with a lot of back pain. No bowel dysfunction, she has some bladder leakage. Shehad a normal birth and early development.  There is no history of febrile convulsions, CNS infections such as meningitis/encephalitis, significant traumatic brain injury, neurosurgical procedures, or family history of seizures.  Prior AEDs: Levetiracetam   PAST MEDICAL HISTORY: Past Medical History:  Diagnosis Date  . Allergy    seasonal also with dogs and cats  . Arthritis   . Asthma  hx of in childhood   . Bladder infection    hx of   . Cancer [facility])    breast cancer bilat has had double mastectomy   . Chronic kidney disease    hx of kidney stones last one pasted 1 month ago   . Fibrocystic breast disease   . H/O blood clots    during delivery occurred 50 years ago per left arm   . PONV (postoperative nausea and vomiting)    pt  states stays very sleepy for days following anesthesia     PAST SURGICAL HISTORY: Past Surgical History:  Procedure Laterality Date  . ABDOMINAL HYSTERECTOMY    . APPENDECTOMY    . BACK SURGERY     disc repaired 20 years ago   . BREAST SURGERY     double mastectomy   . CESAREAN SECTION     times 2  . colonscopy     . cyst repaired      following mammogram / cyst ruptured had to surgically repair area / left side   . DILATION AND CURETTAGE OF UTERUS    . EYE SURGERY     bilat cataract surgery   . HAND SURGERY     left hand / joint issues with thumb   . LUNG SURGERY     related to fatty tumor / left approx 12 years ago   . right knee arthroscopy     . TOTAL KNEE ARTHROPLASTY Right 01/05/2015   Procedure: RIGHT TOTAL KNEE ARTHROPLASTY;  Surgeon: Latanya Maudlin, MD;  Location: [facility];  Service: Orthopedics;  Laterality: Right;    MEDICATIONS: Current Outpatient Medications on File Prior to Visit  Medication Sig Dispense Refill  . ascorbic acid (VITAMIN C) 500 MG tablet Take by mouth.    Marland Kitchen b complex vitamins capsule Take 1 capsule by mouth daily.    . cetirizine (ZYRTEC) 10 MG tablet Take by mouth.    . Cholecalciferol 25 MCG (1000 UT) tablet Take by mouth.    . cycloSPORINE (RESTASIS) 0.05 % ophthalmic emulsion Place 1 drop into both eyes 2 (two) times daily.    Marland Kitchen lamoTRIgine (LAMICTAL) 25 MG tablet Take 25 mg by mouth in the morning and at bedtime.    Marland Kitchen Lifitegrast (XIIDRA) 5 % SOLN Xiidra 5 % eye drops in a dropperette    . Lutein 10 MG TABS Take by mouth. Unsure of dose.Takes once a day    . Omega-3 1000 MG CAPS Take by mouth.    . promethazine (PHENERGAN) 12.5 MG tablet Take 1 tablet (12.5 mg total) by mouth every 6 (six) hours as needed for nausea or vomiting. (Patient not taking: Reported on 09/06/2020) 40 tablet 0  . rivaroxaban (XARELTO) 10 MG TABS tablet Take 1 tablet (10 mg total) by mouth daily with breakfast. (Patient not taking: Reported on 09/06/2020) 20 tablet 0  .  tiZANidine (ZANAFLEX) 4 MG tablet Take 1 tablet (4 mg total) by mouth every 8 (eight) hours as needed. (Patient not taking: Reported on 09/06/2020) 40 tablet 1   No current facility-administered medications on file prior to visit.    ALLERGIES: Allergies  Allergen Reactions  . Demerol [Meperidine] Nausea And Vomiting  . Sulfa Antibiotics Nausea And Vomiting    FAMILY HISTORY: No family history on file.  SOCIAL HISTORY: Social History   Socioeconomic History  . Marital status: Married    Spouse name: Not on file  . Number of children: Not on file  . Years of  education: Not on file  . Highest education level: Not on file  Occupational History  . Not on file  Tobacco Use  . Smoking status: Never Smoker  . Smokeless tobacco: Never Used  Substance and Sexual Activity  . Alcohol use: No  . Drug use: No  . Sexual activity: Not on file  Other Topics Concern  . Not on file  Social History Narrative   Right handed   Social Determinants of Health   Financial Resource Strain: Not on file  Food Insecurity: Not on file  Transportation Needs: Not on file  Physical Activity: Not on file  Stress: Not on file  Social Connections: Not on file  Intimate Partner Violence: Not on file     PHYSICAL EXAM: Vitals:   09/06/20 1307  BP: (!) 165/76  Pulse: (!) 102  SpO2: 97%   General: No acute distress Head:  Normocephalic/atraumatic Skin/Extremities: No rash, no edema Neurological Exam: Mental status: alert and awake. No dysarthria or aphasia, Fund of knowledge is appropriate.  Recent and remote memory are intact.  Attention and concentration are normal.     Cranial nerves: CN I: not tested CN II: pupils equal, round and reactive to light, visual fields intact CN III, IV, VI:  full range of motion, no nystagmus, no ptosis CN V: facial sensation intact CN VII: upper and lower [name] symmetric CN VIII: hearing intact to conversation Bulk & Tone: normal, no fasciculations, no  cogwheeling Motor: 5/5 throughout with no pronator drift. Sensation: intact to light touch, cold, pin, vibration sense. Deep Tendon Reflexes: brisk +2 throughout except for absent ankle jerks bilaterally, no ankle clonus, negative Hoffman sign Plantar responses: downgoing bilaterally Cerebellar: no incoordination on finger to nose testing Gait: slow and cautious due to back pain Tremor: none Good finger taps   IMPRESSION: This is a pleasant 87 year old right-handed woman with a history of breast cancer presenting for second opinion on seizures. She has had nocturnal episodes where she describes being shook awake one time (no witnesses) but did not see any visible shaking, and since then she has had recurrent nocturnal episodes of sensory symptoms over the right side. They are not clearly stereotyped and quite brief. She was diagnosed with seizures due to abnormal EEG, however it is unclear if these episodes represent true seizure activity. We discussed doing a 72-hour EEG to further classify symptoms. Since she has not noticed much improvement with Lamotrigine, instructed to reduce back to 75mg  in AM, 100mg  in PM. If EEG is normal, we may consider doing a sleep study and cervical MRI to assess for focal abnormality causing right-sided symptoms. Follow-up after EEG, she knows to call for any changes.   Thank you for allowing me to participate in the care of this patient. Please do not hesitate to call for any questions or concerns.   Ellouise Newer, M.D.  CC: Dr. Lorne Skeens

## 2020-09-19 ENCOUNTER — Other Ambulatory Visit: Payer: Medicare Other

## 2020-09-26 ENCOUNTER — Other Ambulatory Visit: Payer: Self-pay

## 2020-09-26 ENCOUNTER — Ambulatory Visit: Payer: Medicare Other | Admitting: Neurology

## 2020-09-26 ENCOUNTER — Telehealth: Payer: Self-pay | Admitting: Neurology

## 2020-09-26 DIAGNOSIS — R29818 Other symptoms and signs involving the nervous system: Secondary | ICD-10-CM | POA: Diagnosis not present

## 2020-09-26 MED ORDER — LAMOTRIGINE 25 MG PO TABS: 25.0000 mg | ORAL_TABLET | Freq: Two times a day (BID) | ORAL | 1 refills | Status: DC

## 2020-09-26 NOTE — Telephone Encounter (Signed)
Refill sent to [facility]

## 2020-09-26 NOTE — Telephone Encounter (Signed)
Patient needs a refill on the Lamotrigine to [facility]

## 2020-09-29 ENCOUNTER — Other Ambulatory Visit: Payer: Self-pay

## 2020-09-29 MED ORDER — LAMOTRIGINE 25 MG PO TABS
ORAL_TABLET | ORAL | 1 refills | Status: DC
Start: 2020-09-29 — End: 2021-01-05

## 2020-10-11 ENCOUNTER — Telehealth: Payer: Self-pay | Admitting: Neurology

## 2020-10-11 NOTE — Telephone Encounter (Signed)
Patient called in and left a message wanting to find out about her EEG results.

## 2020-10-11 NOTE — Telephone Encounter (Signed)
Ok for f/u on 2/23 at 10am, thanks

## 2020-10-19 ENCOUNTER — Ambulatory Visit: Payer: Medicare Other | Admitting: Neurology

## 2020-10-19 ENCOUNTER — Other Ambulatory Visit: Payer: Self-pay

## 2020-10-19 ENCOUNTER — Encounter: Payer: Self-pay | Admitting: Neurology

## 2020-10-19 VITALS — BP 160/70 | HR 74 | Resp 18 | Ht 61.0 in | Wt 125.0 lb

## 2020-10-19 DIAGNOSIS — R202 Paresthesia of skin: Secondary | ICD-10-CM

## 2020-10-19 DIAGNOSIS — M542 Cervicalgia: Secondary | ICD-10-CM | POA: Diagnosis not present

## 2020-10-19 DIAGNOSIS — G4769 Other sleep related movement disorders: Secondary | ICD-10-CM

## 2020-10-19 DIAGNOSIS — R29818 Other symptoms and signs involving the nervous system: Secondary | ICD-10-CM

## 2020-10-19 NOTE — Patient Instructions (Addendum)
1. Schedule MRI cervical spine without contrast  2. Schedule home sleep study  3. Start weaning off Lamotrigine 25mg : Take 3 tablets twice a day for 1 week,  then 2 tabs in AM, 3 tabs in PM for 1 week,  then 2 tablets twice a day for 1 week, then 1 tab in AM, 2 tabs in PM for 1 week, then 1 tab twice a day for 1 week, then 1 tab every night for 1 week, then stop  4. Follow-up in 3-4 months, call for any changes   We have sent a referral to [facility] for your MRI and they will call you directly to schedule your appointment. They are located at [address]. If you need to contact them directly please call [phone].

## 2020-10-19 NOTE — Progress Notes (Signed)
NEUROLOGY FOLLOW UP OFFICE NOTE  Anna [name] [id number] 23-Sep-1933  HISTORY OF PRESENT ILLNESS: I had the pleasure of seeing Anna [name] in follow-up in the neurology clinic on 10/19/2020.  The patient was last seen a month ago for diagnosis of seizures. She is accompanied by her daughter Caren Griffins to help supplement the history. Records and images were personally reviewed where available.  She had a 72-hour EEG earlier this month which was normal. She had 5 push button events for episodes that woke her up from sleep, she heard a shrill noise, right on top of head, shiver in chest/arms (not visible on video), with no associated EEG changes seen. Sleep architecture is seen, then an arousal pattern with no epileptiform changes. She was reporting symptoms every 1-2 weeks, she has not had any spells so far since the EEG 3 weeks ago. Family has been concerned about her unstable walking, no falls in the past month. She has a lot of pain in her neck and back.    History on Initial Assessment 09/06/2020: This is a pleasant 87 year old right-handed woman with a history of breast cancer presenting for second opinion regarding seizures. Her granddaughter [name] is present to provide additional information. Symptoms started a year ago, they all occur when she is asleep.The first event occurred while she was taking a nap, all of a sudden it felt like she was hit on the left temple. This woke her up, it did not last for just a second, then there was no lingering pain, no dizziness, focal numbness/tingling/weakness. The next episode occurred again while napping, she then felt like she was hit on the frontal region, again waking her up. Several months later at midnight while asleep on her lounge chair, she states she shook all over, the chair shook, waking her up. She did not see her body shaking when she woke up. She had an MRI brain in 09/2019 which did not show any acute changes, there was mild diffuse volume  loss, right mastoid effusion, left frontal and anterior ethmoid sinus opacification. She was evaluated by neurologist Dr. Macario Carls in 10/2019 and had an EEG in 11/2019 which was abnormal, reporting brief bursts of bilateral temporal sharp theta waves in drowsiness and rare sharp waves in the left mid-temporal are with brief bursts of low voltage delta waves. She was started on Levetiracetam which made her dizzy. She was started on Lamotrigine in 12/2019 with doses increased over the next few months due to continued episodes where she would describe nocturnal sensations that wake her up from sleep. She has difficulty describing them, but states that they are always on the right side, from her shoulder going down, or on her right knee, or ankle. She states it is not a numbness or tingling, no pain, just a sensation. They occur every week or two with no clear triggers. They only last but a minute, always waking her from sleep. No tongue bite or incontinence. None during wakefulness. She had increased Lamotrigine to 100mg  BID in November with no change in these episodes. She denies any headaches, vision changes, staring/unresponsive episodes, loss of time, olfactory/gustatory hallucinations, myoclonic jerks. She has always had a little pain in her neck and back pain. She wears hearing aids but never had any clarity in her right ear. Sleep is good. She does not snore, she takes a nap during the day but does not feel drowsy. [name] has noticed her gait has worsened, she has been dealing with a lot  of back pain. No bowel dysfunction, she has some bladder leakage. Shehad a normal birth and early development.  There is no history of febrile convulsions, CNS infections such as meningitis/encephalitis, significant traumatic brain injury, neurosurgical procedures, or family history of seizures.  Prior AEDs: Levetiracetam   PAST MEDICAL HISTORY: Past Medical History:  Diagnosis Date  . Allergy    seasonal also with dogs and  cats  . Arthritis   . Asthma    hx of in childhood   . Bladder infection    hx of   . Cancer [facility])    breast cancer bilat has had double mastectomy   . Chronic kidney disease    hx of kidney stones last one pasted 1 month ago   . Fibrocystic breast disease   . H/O blood clots    during delivery occurred 50 years ago per left arm   . PONV (postoperative nausea and vomiting)    pt states stays very sleepy for days following anesthesia     MEDICATIONS: Current Outpatient Medications on File Prior to Visit  Medication Sig Dispense Refill  . ascorbic acid (VITAMIN C) 500 MG tablet Take by mouth.    Marland Kitchen b complex vitamins capsule Take 1 capsule by mouth daily.    . cetirizine (ZYRTEC) 10 MG tablet Take by mouth.    . Cholecalciferol 25 MCG (1000 UT) tablet Take by mouth.    . cycloSPORINE (RESTASIS) 0.05 % ophthalmic emulsion Place 1 drop into both eyes 2 (two) times daily.    Marland Kitchen lamoTRIgine (LAMICTAL) 25 MG tablet Take 3 tabs in AM, 4 tabs in PM 630 tablet 1  . Lifitegrast (XIIDRA) 5 % SOLN Xiidra 5 % eye drops in a dropperette    . Lutein 10 MG TABS Take by mouth. Unsure of dose.Takes once a day    . Omega-3 1000 MG CAPS Take by mouth.    . promethazine (PHENERGAN) 12.5 MG tablet Take 1 tablet (12.5 mg total) by mouth every 6 (six) hours as needed for nausea or vomiting. (Patient not taking: Reported on 09/06/2020) 40 tablet 0  . rivaroxaban (XARELTO) 10 MG TABS tablet Take 1 tablet (10 mg total) by mouth daily with breakfast. (Patient not taking: Reported on 09/06/2020) 20 tablet 0  . tiZANidine (ZANAFLEX) 4 MG tablet Take 1 tablet (4 mg total) by mouth every 8 (eight) hours as needed. (Patient not taking: Reported on 09/06/2020) 40 tablet 1   No current facility-administered medications on file prior to visit.    ALLERGIES: Allergies  Allergen Reactions  . Demerol [Meperidine] Nausea And Vomiting  . Sulfa Antibiotics Nausea And Vomiting    FAMILY HISTORY: No family history on  file.  SOCIAL HISTORY: Social History   Socioeconomic History  . Marital status: Married    Spouse name: Not on file  . Number of children: Not on file  . Years of education: Not on file  . Highest education level: Not on file  Occupational History  . Not on file  Tobacco Use  . Smoking status: Never Smoker  . Smokeless tobacco: Never Used  Substance and Sexual Activity  . Alcohol use: No  . Drug use: No  . Sexual activity: Not on file  Other Topics Concern  . Not on file  Social History Narrative   Right handed   Social Determinants of Health   Financial Resource Strain: Not on file  Food Insecurity: Not on file  Transportation Needs: Not on file  Physical Activity:  Not on file  Stress: Not on file  Social Connections: Not on file  Intimate Partner Violence: Not on file     PHYSICAL EXAM: Vitals:   10/19/20 1240  BP: (!) 160/70  Pulse: 74  Resp: 18  SpO2: 98%   General: No acute distress Head:  Normocephalic/atraumatic Skin/Extremities: No rash, no edema Neurological Exam: alert and awake. No aphasia or dysarthria. Fund of knowledge is appropriate.  Recent and remote memory are intact.  Attention and concentration are normal.   Cranial nerves: Pupils equal, round. Extraocular movements intact with no nystagmus. Visual fields full.  No facial asymmetry.  Motor: Bulk and tone normal, muscle strength 5/5 throughout with no pronator drift. Reflexes brisk +2 on both UE, +2 both LE, negative Hoffman sign.  Finger to nose testing intact.  Gait slow and cautious, no ataxia   IMPRESSION: This is a pleasant 87 yo RH woman with a history of breast cancer diagnosed with seizures after she had nocturnal episodes where she describes being shook awake one time (no witnesses) but did not see any visible shaking, then recurrent nocturnal episodes of sensory symptoms over the right side. They are not clearly stereotyped and quite brief. Discussed 72-hour EEG with normal baseline  EEG, typical episodes captured showed arousal from sleep, no epileptiform correlate. Etiology of symptoms unclear, they may be related to a sleep disorder, home sleep study will be ordered. She also reports symptoms are always on the right side and would occur when she is awake as well, MRI cervical spine without contrast will be ordered to assess for underlying structural abnormality. She would benefit from physical therapy, we will order after MRI. We discussed there is no clear evidence of epilepsy at this time, would start weaning off Lamotrigine, weaning instructions given, she knows to call for any changes. Follow-up in 3-4 months or earlier if needed.   Thank you for allowing me to participate in her care.  Please do not hesitate to call for any questions or concerns.   Ellouise Newer, M.D.   CC: Dr. Lorne Skeens

## 2020-10-26 NOTE — Procedures (Signed)
ELECTROENCEPHALOGRAM REPORT  Dates of Recording: 09/26/2020 10:09AM to 09/29/2020 10:03AM Patient's Name: Anna [name] MRN: [id number] Date of Birth: 1934/05/05  Referring Provider: Dr. Ellouise Newer  Procedure: 72-hour ambulatory video EEG  History: This is an 87 year old woman with recurrent nocturnal episodes where she feels shaking and sensory symptoms on the right side. EEG for classification.  Medications: Lamictal, Xarelto, Zanaflex  Technical Summary: This is a 72-hour multichannel digital video EEG recording measured by the international 10-20 system with electrodes applied with paste and impedances below 5000 ohms performed as portable with EKG monitoring.  The digital EEG was referentially recorded, reformatted, and digitally filtered in a variety of bipolar and referential montages for optimal display.    DESCRIPTION OF RECORDING: During maximal wakefulness, the background activity consisted of a symmetric 9 Hz posterior dominant rhythm which was reactive to eye opening.  There were no epileptiform discharges or focal slowing seen in wakefulness.  During the recording, the patient progresses through wakefulness, drowsiness, and Stage 2 sleep. During drowsiness and sleep, there is an increase in theta slowing, at times sharply contoured over the bilateral temporal regions, left greater than right, without clear epileptogenic potential. Again, there were no clear epileptiform discharges seen.  Events: On 1/31 at 1408 hours, she hears a sound in her ear. She is sitting on the chair with no clinical changes seen. Electrographically, there were no EEG or EKG changes seen.  On 1/31 at 1536 hours, she has a right knee quiver. She is on chair with legs under blankets, no clinical changes seen. Electrographically, there were no EEG or EKG changes seen.  On 2/1 at 1409 hours, she has a shiver in chest and arms. She is asleep on the chair, arouses and reaches for pen to write down  symptoms. No clinical changes seen. Electrographically, there were no EEG or EKG changes seen, sleep architecture seen followed by arousal pattern.  On 2/22 at 0200 hours, she heard a shrill noise, left arm was asleep, knee jumped. She is asleep, no clinical changes seen as she is under the blankets. Electrographically, there were no EEG or EKG changes seen. Sleep architecture then arousal pattern when she awakes.  On 09/29/20 at 2245 hours, she hears a ring in the top of her head. She is asleep, wakes up and puts on her eyeglasses and pushes event button, no clinical changes seen. Electrographically, there were no EEG or EKG changes seen. Sleep architecture followed by arousal pattern as she awakes.  There were no electrographic seizures seen.  EKG lead showed irregular rhythm.   IMPRESSION: This 72-hour ambulatory video EEG study is normal.    CLINICAL CORRELATION: A normal EEG does not exclude a clinical diagnosis of epilepsy, however typical events captured with hearing noise, shiver, knee jumping/quiver, did not show any epileptiform correlate, indicating these are non-epileptic. Baseline EEG was normal. If further clinical questions remain, inpatient video EEG monitoring may be helpful.   Ellouise Newer, M.D.

## 2020-11-05 ENCOUNTER — Ambulatory Visit
Admission: RE | Admit: 2020-11-05 | Discharge: 2020-11-05 | Disposition: A | Discharge status: Home / Self Care | Payer: Medicare Other | Source: Ambulatory Visit | Attending: Neurology | Admitting: Neurology

## 2020-11-05 ENCOUNTER — Other Ambulatory Visit: Payer: Self-pay

## 2020-11-05 DIAGNOSIS — R202 Paresthesia of skin: Secondary | ICD-10-CM

## 2020-11-05 DIAGNOSIS — R29818 Other symptoms and signs involving the nervous system: Secondary | ICD-10-CM

## 2020-11-05 DIAGNOSIS — M542 Cervicalgia: Secondary | ICD-10-CM

## 2020-11-08 ENCOUNTER — Other Ambulatory Visit: Payer: Self-pay

## 2020-11-08 ENCOUNTER — Telehealth: Payer: Self-pay | Admitting: Neurology

## 2020-11-08 DIAGNOSIS — M542 Cervicalgia: Secondary | ICD-10-CM

## 2020-11-08 DIAGNOSIS — R202 Paresthesia of skin: Secondary | ICD-10-CM

## 2020-11-08 DIAGNOSIS — R29818 Other symptoms and signs involving the nervous system: Secondary | ICD-10-CM

## 2020-11-08 NOTE — Telephone Encounter (Signed)
Patient called for recent MRI results.

## 2020-11-08 NOTE — Telephone Encounter (Signed)
Patient notified

## 2020-11-09 ENCOUNTER — Telehealth: Payer: Self-pay

## 2020-11-09 NOTE — Telephone Encounter (Signed)
Patient is now scheduled at [facility] long sleep disorder for Monday April 18 at 11am, left message on patients voicemail. Packet will also be mailed out from Sleep disorder clinic with instructions. Patient, advised. No prior authorization needed for [facility]. [phone] [facility] clinic number.

## 2020-12-02 ENCOUNTER — Telehealth: Payer: Self-pay

## 2020-12-02 NOTE — Telephone Encounter (Signed)
Approval verbal given to [facility] to continue frequency of home health services.

## 2020-12-12 ENCOUNTER — Encounter (HOSPITAL_BASED_OUTPATIENT_CLINIC_OR_DEPARTMENT_OTHER): Payer: Medicare Other | Admitting: Internal Medicine

## 2020-12-21 ENCOUNTER — Other Ambulatory Visit: Payer: Self-pay

## 2020-12-21 ENCOUNTER — Ambulatory Visit (HOSPITAL_BASED_OUTPATIENT_CLINIC_OR_DEPARTMENT_OTHER): Payer: Medicare Other | Attending: Neurology | Admitting: Internal Medicine

## 2020-12-21 VITALS — Ht 59.0 in | Wt 125.0 lb

## 2020-12-21 DIAGNOSIS — G4733 Obstructive sleep apnea (adult) (pediatric): Secondary | ICD-10-CM | POA: Insufficient documentation

## 2020-12-21 DIAGNOSIS — G4769 Other sleep related movement disorders: Secondary | ICD-10-CM | POA: Insufficient documentation

## 2020-12-21 DIAGNOSIS — R29818 Other symptoms and signs involving the nervous system: Secondary | ICD-10-CM | POA: Diagnosis not present

## 2020-12-30 ENCOUNTER — Telehealth: Payer: Self-pay | Admitting: Neurology

## 2020-12-30 NOTE — Telephone Encounter (Signed)
Patient called for her recent sleep study results.

## 2020-12-30 NOTE — Telephone Encounter (Signed)
Pt called no answer left a voice mail stating that results are not yet back from the sleep center, we will contact her once we hear from them

## 2020-12-30 NOTE — Telephone Encounter (Signed)
Pls let her know the results are not yet back from the sleep center, we will contact her once we hear from them, thanks

## 2020-12-31 DIAGNOSIS — G4769 Other sleep related movement disorders: Secondary | ICD-10-CM | POA: Diagnosis not present

## 2020-12-31 DIAGNOSIS — G4733 Obstructive sleep apnea (adult) (pediatric): Secondary | ICD-10-CM

## 2020-12-31 NOTE — Procedures (Signed)
   Patient Name: Anna [name], Tigue Date: 12/21/2020 Gender: Female D.O.B: [birth year] Age (years): 84 Referring Provider: Cameron Sprang Height (inches): 13 Interpreting Physician: Baird Lyons MD, ABSM Weight (lbs): 125 RPSGT: Gerhard Perches BMI: 24 MRN: [id number] Neck Size: 14.00  CLINICAL INFORMATION Sleep Study Type: HST Indication for sleep study: OSA Epworth Sleepiness Score: 1  SLEEP STUDY TECHNIQUE A multi-channel overnight portable sleep study was performed. The channels recorded were: nasal airflow, thoracic respiratory movement, and oxygen saturation with a pulse oximetry. Snoring was also monitored.  MEDICATIONS Patient self administered medications include: none reported.  SLEEP ARCHITECTURE Patient was studied for 336.5 minutes. The sleep efficiency was 100.0 % and the patient was supine for 0%. The arousal index was 0.0 per hour.  RESPIRATORY PARAMETERS The overall AHI was 15.0 per hour, with a central apnea index of 0 per hour. The oxygen nadir was 88% during sleep.  CARDIAC DATA Mean heart rate during sleep was 77.3 bpm.  IMPRESSIONS - Moderate obstructive sleep apnea occurred during this study (AHI = 15.0/h). - Mild oxygen desaturation was noted during this study (Min O2 = 88%). Mean O2 saturation 92%. - No snoring was detected during this study.  DIAGNOSIS - Obstructive Sleep Apnea (G47.33)  RECOMMENDATIONS - Suggest CPAP titration sleep study or autopap. Other options including consultation for management would be based on clinical judgment. - Avoid alcohol, sedatives and other CNS depressants that may worsen sleep apnea and disrupt normal sleep architecture. - Sleep hygiene should be reviewed to assess factors that may improve sleep quality. - Weight management and regular exercise should be initiated or continued  [Electronically signed] 12/31/2020 11:23 AM  Baird Lyons MD, ABSM Diplomate, American Board of Sleep Medicine   NPI:  [id number]                          [place], American Board of Sleep Medicine  ELECTRONICALLY SIGNED ON:  12/31/2020, 11:18 AM [place] PH: [phone]   FX: [phone] [place]

## 2021-01-04 ENCOUNTER — Other Ambulatory Visit: Payer: Self-pay

## 2021-01-04 ENCOUNTER — Telehealth: Payer: Self-pay

## 2021-01-04 NOTE — Telephone Encounter (Signed)
Pt called and informed of her sleep study results, she stated that she does not want to see a sleep specialist nor do the Cpap she will call back if she changes her mind

## 2021-01-04 NOTE — Telephone Encounter (Signed)
-----   Message from Cameron Sprang, MD sent at 01/02/2021  2:25 PM EDT ----- Regarding: sleep study results Pls let patient know the sleep study results show moderate sleep apnea. Recommendation by the sleep specialist is for CPAP, does she want [place] to order the CPAP study or she would like to see the sleep specialist first to discuss options? Pls refer to Pulmonary for moderate sleep apnea, and if she wants to do CPAP titration also before seeing him, pls order. Thanks!  ----- Message ----- From: Deneise Lever, MD Sent: 12/31/2020  11:27 AM EDT To: Cameron Sprang, MD

## 2021-01-05 ENCOUNTER — Encounter: Payer: Self-pay | Admitting: Family Medicine

## 2021-01-05 ENCOUNTER — Ambulatory Visit (INDEPENDENT_AMBULATORY_CARE_PROVIDER_SITE_OTHER): Payer: Medicare Other | Admitting: Family Medicine

## 2021-01-05 VITALS — BP 122/66 | HR 93 | Temp 98.1°F | Ht 59.5 in | Wt 131.4 lb

## 2021-01-05 DIAGNOSIS — H905 Unspecified sensorineural hearing loss: Secondary | ICD-10-CM | POA: Diagnosis not present

## 2021-01-05 DIAGNOSIS — J301 Allergic rhinitis due to pollen: Secondary | ICD-10-CM | POA: Diagnosis not present

## 2021-01-05 DIAGNOSIS — E782 Mixed hyperlipidemia: Secondary | ICD-10-CM | POA: Diagnosis not present

## 2021-01-05 DIAGNOSIS — M159 Polyosteoarthritis, unspecified: Secondary | ICD-10-CM | POA: Insufficient documentation

## 2021-01-05 DIAGNOSIS — M8949 Other hypertrophic osteoarthropathy, multiple sites: Secondary | ICD-10-CM

## 2021-01-05 NOTE — Progress Notes (Signed)
[facility] [address] [place] [address] Dept: [phone] Dept Fax: [phone]  New Patient Office Visit  Subjective:    Patient ID: [name], female    DOB: [birth year], 87 y.o..   MRN: [id number]  Chief Complaint  Patient presents with  . Establish Care    NP- establish care.  No concerns.  Changing PCP's due to closer to her home.      History of Present Illness:  Patient is in today to establish care. Ms. Veenstra is originally from [place], [place]. She moved to the [place] area in 1957. She married in 1958 and they moved to [place] 35 years ago. Her husband died 3 years ago due to Parkinson's disease. She has two children Braeden Dolinski, [name]- 58 and [name]- 54). 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 She denies any tobacco, alcohol, or drug use.  Ms. Bardsley has a history fo allergic rhinitis. She is managed with Zyrtec for this and feels this is doing well.  Ms. Lillibridge has a history of hearing loss. This has been classified previously as sensorineural hearing loss, however, she notes her bigger issue is having difficulty hearing in crowded situations. She was previously seen by an ENT provider, who aspirated fluid from ehr ear and told her it would resolve her issue. This did not. She also worked with two different business related to hearing aides, but has not found that the aides helped her at all.  Ms. Pillard has a history of multiple arthritic complaints, including most of her extremities and DDD of the lumbar and cervical spine. She has been averse to taking any pain medication other than Tylenol and has not wanted to pursue spinal injections or nerve ablations.  Ms. Heitman has a history of recurrent kidney stones (~ 12). As such, she avoids oral calcium supplements, despite having been diagnosed  with osteoporosis. She does take Vit D supplement and has been noted to previously be Vit D deficient.  Ms. Vialpando has a history of hyperlipemia. She takes a fish oil supplement, preferring to not take other prescription meds for this condition.  Ms. Tat has a history of prior breast cancer. She underwent a double mastectomy 12-15 years ago. She has also had a prior right TKJ replacement. She does have some pain still associated with the knee related to "nerve damage" fromt he surgery.  Past Medical History: Patient Active Problem List   Diagnosis Date Noted  . Osteoarthritis of multiple joints 01/05/2021  . Nonintractable epilepsy with complex partial seizures ([place]) 12/07/2019  . Chronic bilateral low back pain without sciatica 12/07/2019  . Idiopathic stabbing headache 11/02/2019  . Sensorineural hearing loss (SNHL) 08/07/2018  . Idiopathic peripheral neuropathy 01/25/2016  . Nephrolithiasis 03/29/2015  . Postoperative anemia due to acute blood loss 01/06/2015  . History of total knee arthroplasty 01/05/2015  . Rosacea 07/14/2014  . Degenerative joint disease of cervical and lumbar spine 07/14/2014  . Microscopic hematuria 07/14/2014  . Mammogram abnormal 07/14/2014  . History of breast cancer in female 07/14/2014  . Chest pain 07/14/2014  . Atrophic vaginitis 07/14/2014  . Allergic rhinitis 07/14/2014  . 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  . Vitamin D deficiency 05/18/2014  . Osteoporosis 05/18/2014  . Mixed hyperlipidemia 05/18/2014  . Diverticulosis of large intestine without hemorrhage 05/18/2014   Past Surgical History:  Procedure Laterality Date  . ABDOMINAL HYSTERECTOMY    .  APPENDECTOMY    . BACK SURGERY     disc repaired 20 years ago   . BREAST SURGERY     double mastectomy   . CESAREAN SECTION     times 2  . colonscopy     . cyst repaired      following mammogram / cyst ruptured had to surgically repair area / left side   . DILATION AND CURETTAGE OF UTERUS    .  EYE SURGERY     bilat cataract surgery   . HAND SURGERY     left hand / joint issues with thumb   . LUNG SURGERY     related to fatty tumor / left approx 12 years ago   . right knee arthroscopy     . TOTAL KNEE ARTHROPLASTY Right 01/05/2015   Procedure: RIGHT TOTAL KNEE ARTHROPLASTY;  Surgeon: Latanya Maudlin, MD;  Location: [facility];  Service: Orthopedics;  Laterality: Right;   History reviewed. No pertinent family history.  Outpatient Medications Prior to Visit  Medication Sig Dispense Refill  . ascorbic acid (VITAMIN C) 500 MG tablet Take by mouth.    Marland Kitchen b complex vitamins capsule Take 1 capsule by mouth daily.    . cetirizine (ZYRTEC) 10 MG tablet Take by mouth.    . Cholecalciferol 25 MCG (1000 UT) tablet Take by mouth.    Marland Kitchen Lifitegrast (XIIDRA) 5 % SOLN Xiidra 5 % eye drops in a dropperette    . Lutein 10 MG TABS Take by mouth. Unsure of dose.Takes once a day    . Omega-3 1000 MG CAPS Take by mouth.    . cycloSPORINE (RESTASIS) 0.05 % ophthalmic emulsion Place 1 drop into both eyes 2 (two) times daily.    Marland Kitchen lamoTRIgine (LAMICTAL) 25 MG tablet Take 3 tabs in AM, 4 tabs in PM (Patient not taking: Reported on 01/05/2021) 630 tablet 1  . promethazine (PHENERGAN) 12.5 MG tablet Take 1 tablet (12.5 mg total) by mouth every 6 (six) hours as needed for nausea or vomiting. (Patient not taking: No sig reported) 40 tablet 0  . rivaroxaban (XARELTO) 10 MG TABS tablet Take 1 tablet (10 mg total) by mouth daily with breakfast. (Patient not taking: No sig reported) 20 tablet 0  . tiZANidine (ZANAFLEX) 4 MG tablet Take 1 tablet (4 mg total) by mouth every 8 (eight) hours as needed. (Patient not taking: No sig reported) 40 tablet 1   No facility-administered medications prior to visit.    Allergies  Allergen Reactions  . Demerol [Meperidine] Nausea And Vomiting  . Sulfa Antibiotics Nausea And Vomiting      Objective:   Today's Vitals   01/05/21 1315  BP: 122/66  Pulse: 93  Temp: 98.1 F  (36.7 C)  TempSrc: Temporal  SpO2: 98%  Weight: 131 lb 6.4 oz (59.6 kg)  Height: 4' 11.5" (1.511 m)   Body mass index is 26.1 kg/m.   General: Well developed, well nourished. No acute distress. Extremities: Full ROM. No joint swelling or tenderness. No edema noted. Psych: Alert and oriented. Normal mood and affect.  Health Maintenance Due  Topic Date Due  . TETANUS/TDAP  Never done  . DEXA SCAN  Never done  . PNA vac Low Risk Adult (1 of 2 - PCV13) Never done  . COVID-19 Vaccine (3 - Booster for Pfizer series) 04/11/2020     Assessment & Plan:   1. Seasonal allergic rhinitis due to pollen Stable on Zyrtec.  2. Sensorineural hearing loss (SNHL), unspecified  laterality It is unclear if her hearing issue is purely due to SNHL. I suspect there may be a degree of otosclerosis. I will refer her to ENT for further assessment of this.  - Ambulatory referral to ENT  3. Primary osteoarthritis involving multiple joints Ms. Wernette has multiple join issues, but is managing with Tylenol as needed.  4. Mixed hyperlipidemia Ms. Borba is on fish oil supplementation. Since she is unwilling to consider other prescription medications for this, I do not feel it would be useful to monitor lipid levels.  I spent 55 minutes with this patient, reviewing prior medical records, conducting a medical history, performing a focused physical exam, placing referrals, and documenting.  [name], MD

## 2021-02-10 ENCOUNTER — Ambulatory Visit: Payer: Medicare Other | Admitting: Neurology

## 2021-02-14 ENCOUNTER — Other Ambulatory Visit: Payer: Self-pay

## 2021-02-14 ENCOUNTER — Ambulatory Visit (INDEPENDENT_AMBULATORY_CARE_PROVIDER_SITE_OTHER): Payer: Medicare Other | Admitting: Otolaryngology

## 2021-02-14 DIAGNOSIS — H6123 Impacted cerumen, bilateral: Secondary | ICD-10-CM

## 2021-02-14 DIAGNOSIS — J310 Chronic rhinitis: Secondary | ICD-10-CM | POA: Diagnosis not present

## 2021-02-14 DIAGNOSIS — H903 Sensorineural hearing loss, bilateral: Secondary | ICD-10-CM | POA: Diagnosis not present

## 2021-02-14 NOTE — Progress Notes (Signed)
HPI: PETER DAQUILA is a 87 y.o. female who presents is referred by her PCP Dr. Gena Fray for evaluation of hearing loss.  She also complains of drainage from her nose.  She has had hearing loss for a number of years and has had hearing aids.  She just recently got new hearing aids from [facility] but does not feel like she hears any clear with the hearing aids.  She has had no pain or drainage from the ears.  She also complains of a chronic clear discharge from her nose that comes and goes..  She had previously been diagnosed with fluid behind one of her TMs and had fluid drained by Dr. Laurance Flatten in [facility]  Past Medical History:  Diagnosis Date   Allergy    seasonal also with dogs and cats   Arthritis    Asthma    hx of in childhood    Bladder infection    hx of    Cancer ([place])    breast cancer bilat has had double mastectomy    Chronic kidney disease    hx of kidney stones last one pasted 1 month ago    Fibrocystic breast disease    H/O blood clots    during delivery occurred 50 years ago per left arm    PONV (postoperative nausea and vomiting)    pt states stays very sleepy for days following anesthesia    Past Surgical History:  Procedure Laterality Date   ABDOMINAL HYSTERECTOMY     APPENDECTOMY     BACK SURGERY     disc repaired 20 years ago    BREAST SURGERY     double mastectomy    CESAREAN SECTION     times 2   colonscopy      cyst repaired      following mammogram / cyst ruptured had to surgically repair area / left side    DILATION AND CURETTAGE OF UTERUS     EYE SURGERY     bilat cataract surgery    HAND SURGERY     left hand / joint issues with thumb    LUNG SURGERY     related to fatty tumor / left approx 12 years ago    right knee arthroscopy      TOTAL KNEE ARTHROPLASTY Right 01/05/2015   Procedure: RIGHT TOTAL KNEE ARTHROPLASTY;  Surgeon: Latanya Maudlin, MD;  Location: [facility];  Service: Orthopedics;  Laterality: Right;   Social History   Socioeconomic History    Marital status: Widowed    Spouse name: Not on file   Number of children: 2   Years of education: Not on file   Highest education level: Not on file  Occupational History   Not on file  Tobacco Use   Smoking status: Never   Smokeless tobacco: Never  Substance and Sexual Activity   Alcohol use: No   Drug use: No   Sexual activity: Not Currently  Other Topics Concern   Not on file  Social History Narrative   Right handed   Drinks caffeine   One story home      Son- Ezra Marquess, [name].   Daughter- [name]   Social Determinants of Health   Financial Resource Strain: Not on file  Food Insecurity: Not on file  Transportation Needs: Not on file  Physical Activity: Not on file  Stress: Not on file  Social Connections: Not on file   No family history on file. Allergies  Allergen Reactions  Demerol [Meperidine] Nausea And Vomiting   Sulfa Antibiotics Nausea And Vomiting   Prior to Admission medications   Medication Sig Start Date End Date Taking? Authorizing Provider  ascorbic acid (VITAMIN C) 500 MG tablet Take by mouth.    [provider]  b complex vitamins capsule Take 1 capsule by mouth daily.    [provider]  cetirizine (ZYRTEC) 10 MG tablet Take by mouth.    [provider]  Cholecalciferol 25 MCG (1000 UT) tablet Take by mouth.    [provider]  Lifitegrast Shirley Friar) 5 % SOLN Xiidra 5 % eye drops in a dropperette    [provider]  Lutein 10 MG TABS Take by mouth. Unsure of dose.Takes once a day    [provider]  Omega-3 1000 MG CAPS Take by mouth.    [provider]     Positive ROS: Otherwise negative  All other systems have been reviewed and were otherwise negative with the exception of those mentioned in the HPI and as above.  Physical Exam: Constitutional: Alert, well-appearing, no acute distress Ears: External ears without lesions or tenderness.  She has small ear canals on both  sides.  The wax was cleaned from the ear canals using suction and curettes.  After cleaning the wax from the ear canals the TMs were otherwise clear.  She had a small scar tissue on the right TM where she probably had a previous myringotomy but this is healed.  On tuning fork testing with 512 tuning fork AC was greater than BC bilaterally and Weber was midline. Nasal: External nose without lesions. Septum relatively midline with mild rhinitis.  Both middle meatus regions were clear with no signs of active infection.  No polyps noted.. Clear nasal passages otherwise. Oral: Lips and gums without lesions. Tongue and palate mucosa without lesions. Posterior oropharynx clear. Neck: No palpable adenopathy or masses Respiratory: Breathing comfortably  Skin: No facial/neck lesions or rash noted.  Audiologic testing demonstrated downsloping sensorineural hearing loss in both ears that was symmetric with SRT of 35 DB in the right ear and 30 DB in the left ear.  She had no significant conductive component on the audiologic testing.  Downsloping sensorineural hearing loss) mild to moderate to severe.  Cerumen impaction removal  Date/Time: 02/14/2021 3:26 PM Performed by: Rozetta Nunnery, MD Authorized by: Rozetta Nunnery, MD   Consent:    Consent obtained:  Verbal   Consent given by:  Patient Procedure details:    Location:  L ear and R ear   Procedure type: curette and suction   Comments:     TMs are clear bilaterally however she has a small scab across scar tissue on the right TM from previous myringotomy.  Assessment: For hearing loss the only option or hearing aids.  She will follow-up with [facility] concerning adjustment of the hearing aids. Mild rhinitis with clear nasal discharge.  Plan: Discussed with her that there is no treatment options for hearing loss outside of hearing aids recommended follow-up with [facility] concerning adjustment of her hearing aids.  She has no conductive  hearing loss no middle ear fluid and the hearing loss is all sensorineural and cochlear in nature. Concerning her nasal discharge recommended use of Flonase and/or saline rinses.  She can take antihistamines if they are beneficial. She will follow-up as needed.   Radene Journey, MD   CC:

## 2021-02-20 ENCOUNTER — Telehealth: Payer: Self-pay | Admitting: Family Medicine

## 2021-02-20 NOTE — Telephone Encounter (Signed)
Spoke to patient and advised that she will have to wait till her appt tomorrow.  Patient agreeable and will send a link then. Dm/cma

## 2021-02-20 NOTE — Telephone Encounter (Signed)
Pt has an upcoming appointment with Dr. Gena Fray on 02/21/21 for coughing, sore throat and a runny nose. She tested positive on Sunday 6/26. She would like to know if there is anything we could do for her until her appointment. She would like [place] to use [facility] pharmacy if so. Please advise pt

## 2021-02-21 ENCOUNTER — Telehealth: Payer: Medicare Other | Admitting: Family Medicine

## 2021-02-21 ENCOUNTER — Encounter: Payer: Self-pay | Admitting: Family Medicine

## 2021-02-21 ENCOUNTER — Other Ambulatory Visit (HOSPITAL_COMMUNITY): Payer: Self-pay

## 2021-02-21 ENCOUNTER — Telehealth (INDEPENDENT_AMBULATORY_CARE_PROVIDER_SITE_OTHER): Payer: Medicare Other | Admitting: Family Medicine

## 2021-02-21 VITALS — Ht 59.5 in | Wt 125.0 lb

## 2021-02-21 DIAGNOSIS — U071 COVID-19: Secondary | ICD-10-CM | POA: Diagnosis not present

## 2021-02-21 MED ORDER — MOLNUPIRAVIR EUA 200MG CAPSULE
4.0000 | ORAL_CAPSULE | Freq: Two times a day (BID) | ORAL | 0 refills | Status: AC
  Filled 2021-02-21: qty 40, 5d supply, fill #0

## 2021-02-21 NOTE — Progress Notes (Signed)
[facility] [address] [place] [address] Dept: [phone] Dept Fax: [phone]  Virtual Video Visit  I connected with Anna [name] on 02/21/21 at 11:30 AM EDT by a video enabled telemedicine application and verified that I am speaking with the correct person using two identifiers.  Location patient: Home Location provider: Clinic Persons participating in the virtual visit: Patient, Provider  I discussed the limitations of evaluation and management by telemedicine and the availability of in person appointments. The patient expressed understanding and agreed to proceed.  Chief Complaint  Patient presents with   Acute Visit    C/o having runny nose, coughing, sneezing x 5 days.  Positive for covid on 02/19/21. She has taken Tylenol.      SUBJECTIVE:  HPI: Anna [name] is a 87 y.o. female who presents with a history of runny nose, sneezing, and cough, having started last Thursday. She had a negative COVID test on Sat., but a positive test on Sunday (02/19/21). [name] denies any fever, fatigue, or dyspnea and relates that she has continued to get up and around, doing housework. She is not using any particular medication, though she is on Zyrtec for allergies.  Patient Active Problem List   Diagnosis Date Noted   Osteoarthritis of multiple joints 01/05/2021   Nonintractable epilepsy with complex partial seizures ([place]) 12/07/2019   Chronic bilateral low back pain without sciatica 12/07/2019   Idiopathic stabbing headache 11/02/2019   Sensorineural hearing loss (SNHL) 08/07/2018   Idiopathic peripheral neuropathy 01/25/2016   Nephrolithiasis 03/29/2015   Postoperative anemia due to acute blood loss 01/06/2015   History of total [name] arthroplasty 01/05/2015   Rosacea 07/14/2014   Degenerative joint disease of cervical and lumbar spine 07/14/2014   Microscopic hematuria 07/14/2014   Mammogram abnormal  07/14/2014   History of breast cancer in female 07/14/2014   Chest pain 07/14/2014   Atrophic vaginitis 07/14/2014   Allergic rhinitis 07/14/2014   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   Vitamin D deficiency 05/18/2014   Osteoporosis 05/18/2014   Mixed hyperlipidemia 05/18/2014   Diverticulosis of large intestine without hemorrhage 05/18/2014   Past Surgical History:  Procedure Laterality Date   ABDOMINAL HYSTERECTOMY     APPENDECTOMY     BACK SURGERY     disc repaired 20 years ago    BREAST SURGERY     double mastectomy    CESAREAN SECTION     times 2   colonscopy      cyst repaired      following mammogram / cyst ruptured had to surgically repair area / left side    DILATION AND CURETTAGE OF UTERUS     EYE SURGERY     bilat cataract surgery    HAND SURGERY     left hand / joint issues with thumb    LUNG SURGERY     related to fatty tumor / left approx 12 years ago    right [name] arthroscopy      TOTAL [name] ARTHROPLASTY Right 01/05/2015   Procedure: RIGHT TOTAL [name] ARTHROPLASTY;  Surgeon: Latanya Maudlin, MD;  Location: [facility];  Service: Orthopedics;  Laterality: Right;   No family history on file.  Social History   Tobacco Use   Smoking status: Never   Smokeless tobacco: Never  Substance Use Topics   Alcohol use: No   Drug use: No    Current Outpatient Medications:    ascorbic acid (VITAMIN C) 500 MG tablet, Take by mouth.,  Disp: , Rfl:    b complex vitamins capsule, Take 1 capsule by mouth daily., Disp: , Rfl:    cetirizine (ZYRTEC) 10 MG tablet, Take by mouth., Disp: , Rfl:    Cholecalciferol 25 MCG (1000 UT) tablet, Take by mouth., Disp: , Rfl:    Lifitegrast (XIIDRA) 5 % SOLN, Xiidra 5 % eye drops in a dropperette, Disp: , Rfl:    Lutein 10 MG TABS, Take by mouth. Unsure of dose.Takes once a day, Disp: , Rfl:    Minoxidil (ROGAINE MENS) 5 % FOAM, See admin instructions., Disp: , Rfl:    molnupiravir EUA 200 mg CAPS, Take 4 capsules (800 mg total) by mouth 2 (two) times  daily for 5 days., Disp: 40 capsule, Rfl: 0   Multiple Vitamins-Minerals (PRESERVISION AREDS 2 PO), Take by mouth., Disp: , Rfl:    Omega-3 1000 MG CAPS, Take by mouth., Disp: , Rfl:   Allergies  Allergen Reactions   Demerol [Meperidine] Nausea And Vomiting   Sulfa Antibiotics Nausea And Vomiting   ROS: See pertinent positives and negatives per HPI.  OBSERVATIONS/OBJECTIVE:  VITALS per patient if applicable: Today's Vitals   02/21/21 1126  Weight: 125 lb (56.7 kg)  Height: 4' 11.5" (1.511 m)   Body mass index is 24.82 kg/m.   GENERAL: Alert and oriented. Appears well and in no acute distress.  HEENT: Atraumatic. Conjunctiva clear. No obvious abnormalities on inspection of external nose and ears.  NECK: Normal movements of the head and neck.  LUNGS: On inspection, no signs of respiratory distress. Breathing rate appears normal. No obvious gross SOB, gasping or wheezing, and no conversational dyspnea.  CV: No obvious cyanosis.  PSYCH/NEURO: Pleasant and cooperative. No obvious depression or anxiety. Speech and thought processing grossly intact.  ASSESSMENT AND PLAN:  1. COVID-19 Reviewed home care instructions for COVID. Advised self-isolation at home for at least 5 days. After 5 days, if improved and fever resolved, can be in public, but should wear a mask around others for an additional 5 days. Discussed home care for viral illness, including rest, pushing fluids, and OTC medications as needed for symptom relief. Recommend hot tea with honey for sore throat symptoms. If symptoms, esp, dyspnea develops/worsens, recommend in-person evaluation at either an urgent care or the emergency room.  Recommend treatment with an antiviral. Patient's last GFR was normal, but has not been retested in past 8 months. As such, will prescribe molnupiravir.  - molnupiravir EUA 200 mg CAPS; Take 4 capsules (800 mg total) by mouth 2 (two) times daily for 5 days.  Dispense: 40 capsule; Refill:  0    I discussed the assessment and treatment plan with the patient. The patient was provided an opportunity to ask questions and all were answered. The patient agreed with the plan and demonstrated an understanding of the instructions.   The patient was advised to call back or seek an in-person evaluation if the symptoms worsen or if the condition fails to improve as anticipated.   Anna Salter, MD

## 2021-03-02 ENCOUNTER — Telehealth: Payer: Self-pay

## 2021-03-02 ENCOUNTER — Other Ambulatory Visit: Payer: Self-pay

## 2021-03-02 NOTE — Telephone Encounter (Signed)
Pt calling for appointment.  Rt breast soreness.  X 4-5 days.

## 2021-03-03 ENCOUNTER — Encounter: Payer: Self-pay | Admitting: Family Medicine

## 2021-03-03 ENCOUNTER — Ambulatory Visit: Payer: Medicare Other | Admitting: Neurology

## 2021-03-03 ENCOUNTER — Other Ambulatory Visit: Payer: Self-pay

## 2021-03-03 ENCOUNTER — Ambulatory Visit (INDEPENDENT_AMBULATORY_CARE_PROVIDER_SITE_OTHER): Payer: Medicare Other | Admitting: Family Medicine

## 2021-03-03 ENCOUNTER — Encounter: Payer: Self-pay | Admitting: Neurology

## 2021-03-03 VITALS — BP 134/72 | HR 95 | Temp 97.0°F | Ht 59.5 in | Wt 133.6 lb

## 2021-03-03 VITALS — BP 129/62 | HR 93 | Ht 62.0 in | Wt 133.8 lb

## 2021-03-03 DIAGNOSIS — L304 Erythema intertrigo: Secondary | ICD-10-CM

## 2021-03-03 DIAGNOSIS — G4733 Obstructive sleep apnea (adult) (pediatric): Secondary | ICD-10-CM

## 2021-03-03 DIAGNOSIS — G4769 Other sleep related movement disorders: Secondary | ICD-10-CM

## 2021-03-03 MED ORDER — CLOTRIMAZOLE 1 % EX OINT
TOPICAL_OINTMENT | CUTANEOUS | 0 refills | Status: DC
Start: 2021-03-03 — End: 2022-02-14

## 2021-03-03 NOTE — Progress Notes (Signed)
[facility] [address] [place] [address] Dept: [phone] Dept Fax: [phone]  Office Visit  Subjective:    Patient ID: Anna [name], female    DOB: 1933/10/13, 87 y.o..   MRN: [id number]  Chief Complaint  Patient presents with   Acute Visit    C/o having a red sore spot in the RT breast area x 1 week.      History of Present Illness:  Patient is in today with a rash on her right chest wall. She has had a prior bilateral mastectomy and notes she has some redundant skin. She started developing an area of redness and tenderness around the area where her bra rubs at this skin. She notes this is also an area where she has some seborrheic keratoses that the dermatologist has been following.  Past Medical History: Patient Active Problem List   Diagnosis Date Noted   Osteoarthritis of multiple joints 01/05/2021   Nonintractable epilepsy with complex partial seizures ([place]) 12/07/2019   Chronic bilateral low back pain without sciatica 12/07/2019   Idiopathic stabbing headache 11/02/2019   Sensorineural hearing loss (SNHL) 08/07/2018   Idiopathic peripheral neuropathy 01/25/2016   Nephrolithiasis 03/29/2015   Postoperative anemia due to acute blood loss 01/06/2015   History of total knee arthroplasty 01/05/2015   Rosacea 07/14/2014   Degenerative joint disease of cervical and lumbar spine 07/14/2014   Microscopic hematuria 07/14/2014   Mammogram abnormal 07/14/2014   History of breast cancer in female 07/14/2014   Chest pain 07/14/2014   Atrophic vaginitis 07/14/2014   Allergic rhinitis 07/14/2014   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   Vitamin D deficiency 05/18/2014   Osteoporosis 05/18/2014   Mixed hyperlipidemia 05/18/2014   Diverticulosis of large intestine without hemorrhage 05/18/2014    Past Surgical History:  Procedure Laterality Date   ABDOMINAL HYSTERECTOMY     APPENDECTOMY     BACK SURGERY     disc  repaired 20 years ago    BREAST SURGERY     double mastectomy    CESAREAN SECTION     times 2   colonscopy      cyst repaired      following mammogram / cyst ruptured had to surgically repair area / left side    DILATION AND CURETTAGE OF UTERUS     EYE SURGERY     bilat cataract surgery    HAND SURGERY     left hand / joint issues with thumb    LUNG SURGERY     related to fatty tumor / left approx 12 years ago    right knee arthroscopy      TOTAL KNEE ARTHROPLASTY Right 01/05/2015   Procedure: RIGHT TOTAL KNEE ARTHROPLASTY;  Surgeon: Latanya Maudlin, MD;  Location: [facility];  Service: Orthopedics;  Laterality: Right;   No family history on file.  Outpatient Medications Prior to Visit  Medication Sig Dispense Refill   ascorbic acid (VITAMIN C) 500 MG tablet Take by mouth.     b complex vitamins capsule Take 1 capsule by mouth daily.     cetirizine (ZYRTEC) 10 MG tablet Take by mouth.     Cholecalciferol 25 MCG (1000 UT) tablet Take by mouth.     Lifitegrast (XIIDRA) 5 % SOLN Xiidra 5 % eye drops in a dropperette     Lutein 10 MG TABS Take by mouth. Unsure of dose.Takes once a day     Minoxidil (ROGAINE MENS) 5 % FOAM See admin instructions.  Multiple Vitamins-Minerals (PRESERVISION AREDS 2 PO) Take by mouth.     Omega-3 1000 MG CAPS Take by mouth.     No facility-administered medications prior to visit.   Allergies  Allergen Reactions   Demerol [Meperidine] Nausea And Vomiting   Sulfa Antibiotics Nausea And Vomiting    Objective:   Today's Vitals   03/03/21 0959  BP: 134/72  Pulse: 95  Temp: (!) 97 F (36.1 C)  TempSrc: Temporal  SpO2: 99%  Weight: 133 lb 9.6 oz (60.6 kg)  Height: 4' 11.5" (1.511 m)   Body mass index is 26.53 kg/m.   General: Well developed, well nourished. No acute distress. Skin: Warm and dry. There is a rectangular patch of erythema with satellite lesions in a fold of skin at the right   chest wall. Centrally there is a flat plaque with a  warty appearance to the surface centrally within the zone of   erythema. Psych: Alert and oriented. Normal mood and affect.  Health Maintenance Due  Topic Date Due   TETANUS/TDAP  Never done   Zoster Vaccines- Shingrix (1 of 2) Never done   DEXA SCAN  Never done   PNA vac Low Risk Adult (1 of 2 - PCV13) Never done   COVID-19 Vaccine (3 - Booster for Pfizer series) 04/11/2020     Assessment & Plan:   1. Intertrigo Rash is consistent with intertrigo, likely of a fungal origin. I will prescribe clotrimazole cream. I recommended she use drying powders in this area to help prevent irritation and recurrence.  - Clotrimazole 1 % OINT; Apply a small amount of cream to rash twice a day until resolved.  Dispense: 56.7 g; Refill: 0  [name], MD

## 2021-03-03 NOTE — Patient Instructions (Signed)
Intertrigo Intertrigo is skin irritation or inflammation (dermatitis) that occurs when folds of skin rub together. The irritation can cause a rash and make skin raw and itchy. This condition most commonly occurs in the skin folds of these areas: Toes. Armpits. Groin. Under the belly. Under the breasts. Buttocks. Intertrigo is not passed from person to person (is not contagious). What are the causes? This condition is caused by heat, moisture, rubbing (friction), and not enough air circulation. The condition can be made worse by: Sweat. Bacteria. A fungus, such as yeast. What increases the risk? This condition is more likely to occur if you have moisture in your skin folds. You are more likely to develop this condition if you: Have diabetes. Are overweight. Are not able to move around or are not active. Live in a warm and moist climate. Wear splints, braces, or other medical devices. Are not able to control your bowels or bladder (have incontinence). What are the signs or symptoms? Symptoms of this condition include: A pink or red skin rash in the skin fold or near the skin fold. Raw or scaly skin. Itchiness. A burning feeling. Bleeding. Leaking fluid. A bad smell. How is this diagnosed? This condition is diagnosed with a medical history and physical exam. You mayalso have a skin swab to test for bacteria or a fungus. How is this treated? This condition may be treated by: Cleaning and drying your skin. Taking an antibiotic medicine or using an antibiotic skin cream for a bacterial infection. Using an antifungal cream on your skin or taking pills for an infection that was caused by a fungus, such as yeast. Using a steroid ointment to relieve itchiness and irritation. Separating the skin fold with a clean cotton cloth to absorb moisture and allow air to flow into the area. Follow these instructions at home: Keep the affected area clean and dry. Do not scratch your skin. Stay  in a cool environment as much as possible. Use an air conditioner or fan, if available. Apply over-the-counter and prescription medicines only as told by your health care provider. If you were prescribed an antibiotic medicine, use it as told by your health care provider. Do not stop using the antibiotic even if your condition improves. Keep all follow-up visits as told by your health care provider. This is important. How is this prevented?  Maintain a healthy weight. Take care of your feet, especially if you have diabetes. Foot care includes: Wearing shoes that fit well. Keeping your feet dry. Wearing clean, breathable socks. Protect the skin around your groin and buttocks, especially if you have incontinence. Skin protection includes: Following a regular cleaning routine. Using skin protectant creams, powders, or ointments. Changing protection pads frequently. Do not wear tight clothes. Wear clothes that are loose, absorbent, and made of cotton. Wear a bra that gives good support, if needed. Shower and dry yourself well after activity or exercise. Use a hair dryer on a cool setting to dry between skin folds, especially after you bathe. If you have diabetes, keep your blood sugar under control. Contact a health care provider if: Your symptoms do not improve with treatment. Your symptoms get worse or they spread. You notice increased redness and warmth. You have a fever. Summary Intertrigo is skin irritation or inflammation (dermatitis) that occurs when folds of skin rub together. This condition is caused by heat, moisture, rubbing (friction), and not enough air circulation. This condition may be treated by cleaning and drying your skin and with medicines. Apply  over-the-counter and prescription medicines only as told by your health care provider. Keep all follow-up visits as told by your health care provider. This is important. This information is not intended to replace advice given  to you by your health care provider. Make sure you discuss any questions you have with your healthcare provider. Document Revised: 01/06/2018 Document Reviewed: 01/13/2018 Elsevier Patient Education  [employer].

## 2021-03-03 NOTE — Patient Instructions (Signed)
Continue to monitor your symptoms. If the night time episodes are increasing, would recommend proceeding with referral with sleep specialist for moderate sleep apnea. Follow-up as needed, call for any changes

## 2021-03-03 NOTE — Progress Notes (Signed)
NEUROLOGY FOLLOW UP OFFICE NOTE  Anna [name] [id number] Apr 27, 1934  HISTORY OF PRESENT ILLNESS: I had the pleasure of seeing Anna [name] in follow-up in the neurology clinic on 03/03/2021. She is again accompanied by her daughter who helps supplement the history today. The patient was last seen 4 months ago for nocturnal episodes that were initially diagnosed as seizures. Her 72-hour EEG was normal, she was weaned off Lamotrigine. Records and images were personally reviewed where available.  She had an MRI cervical spine without contrast in 10/2020 which did not show any spinal cord changes. There was mutlilevel spondylosis, moderate foraminal narrowing bilaterally at C3-4, C4-5, and C5-6, C6-7, mild central canal narrowing at C3-4 and C4-5. Sleep study done in 11/2020 showed moderate obstructive sleep apnea with mild oxygen desaturation.   Since her last visit, she reports an episode a couple of weeks ago where she felt shaking from waist down then it was over, waking her up. She feels episodes happen more on the right side, but lately more on both lower limbs. She did physical therapy for balance but did not feel any added benefit from it. She mostly has difficulty with curbs and stairs. She feels she is overall doing okay and declines seeing a sleep specialist to discuss CPAP.    History on Initial Assessment 09/06/2020: This is a pleasant 87 year old right-handed woman with a history of breast cancer presenting for second opinion regarding seizures. Her granddaughter Anna [name] is present to provide additional information. Symptoms started a year ago, they all occur when she is asleep.The first event occurred while she was taking a nap, all of a sudden it felt like she was hit on the left temple. This woke her up, it did not last for just a second, then there was no lingering pain, no dizziness, focal numbness/tingling/weakness. The next episode occurred again while napping, she then felt like she  was hit on the frontal region, again waking her up. Several months later at midnight while asleep on her lounge chair, she states she shook all over, the chair shook, waking her up. She did not see her body shaking when she woke up. She had an MRI brain in 09/2019 which did not show any acute changes, there was mild diffuse volume loss, right mastoid effusion, left frontal and anterior ethmoid sinus opacification. She was evaluated by neurologist Dr. Macario Carls in 10/2019 and had an EEG in 11/2019 which was abnormal, reporting brief bursts of bilateral temporal sharp theta waves in drowsiness and rare sharp waves in the left mid-temporal are with brief bursts of low voltage delta waves. She was started on Levetiracetam which made her dizzy. She was started on Lamotrigine in 12/2019 with doses increased over the next few months due to continued episodes where she would describe nocturnal sensations that wake her up from sleep. She has difficulty describing them, but states that they are always on the right side, from her shoulder going down, or on her right knee, or ankle. She states it is not a numbness or tingling, no pain, just a sensation. They occur every week or two with no clear triggers. They only last but a minute, always waking her from sleep. No tongue bite or incontinence. None during wakefulness. She had increased Lamotrigine to 100mg  BID in November with no change in these episodes. She denies any headaches, vision changes, staring/unresponsive episodes, loss of time, olfactory/gustatory hallucinations, myoclonic jerks. She has always had a little pain in her neck and back  pain. She wears hearing aids but never had any clarity in her right ear. Sleep is good. She does not snore, she takes a nap during the day but does not feel drowsy. Anna [name] has noticed her gait has worsened, she has been dealing with a lot of back pain. No bowel dysfunction, she has some bladder leakage. Shehad a normal birth and early  development.  There is no history of febrile convulsions, CNS infections such as meningitis/encephalitis, significant traumatic brain injury, neurosurgical procedures, or family history of seizures.  Prior AEDs: Levetiracetam  Diagnostic Data: 72-hour EEG in January 2022 was normal. She had 5 push button events for episodes that woke her up from sleep, she heard a shrill noise, right on top of head, shiver in chest/arms (not visible on video), with no associated EEG changes seen. Sleep architecture is seen, then an arousal pattern with no epileptiform changes.   MRI cervical spine without contrast in 10/2020 which did not show any spinal cord changes. There was mutlilevel spondylosis, moderate foraminal narrowing bilaterally at C3-4, C4-5, and C5-6, C6-7, mild central canal narrowing at C3-4 and C4-5.   Sleep study done in 11/2020 showed moderate obstructive sleep apnea with mild oxygen desaturation.    PAST MEDICAL HISTORY: Past Medical History:  Diagnosis Date   Allergy    seasonal also with dogs and cats   Arthritis    Asthma    hx of in childhood    Bladder infection    hx of    Cancer ([place])    breast cancer bilat has had double mastectomy    Chronic kidney disease    hx of kidney stones last one pasted 1 month ago    Fibrocystic breast disease    H/O blood clots    during delivery occurred 50 years ago per left arm    PONV (postoperative nausea and vomiting)    pt states stays very sleepy for days following anesthesia     MEDICATIONS: Current Outpatient Medications on File Prior to Visit  Medication Sig Dispense Refill   ascorbic acid (VITAMIN C) 500 MG tablet Take by mouth.     b complex vitamins capsule Take 1 capsule by mouth daily.     cetirizine (ZYRTEC) 10 MG tablet Take by mouth.     Cholecalciferol 25 MCG (1000 UT) tablet Take by mouth.     Lifitegrast (XIIDRA) 5 % SOLN Xiidra 5 % eye drops in a dropperette     Lutein 10 MG TABS Take by mouth. Unsure of dose.Takes  once a day     Minoxidil (ROGAINE MENS) 5 % FOAM See admin instructions.     Multiple Vitamins-Minerals (PRESERVISION AREDS 2 PO) Take by mouth.     Omega-3 1000 MG CAPS Take by mouth.     No current facility-administered medications on file prior to visit.    ALLERGIES: Allergies  Allergen Reactions   Demerol [Meperidine] Nausea And Vomiting   Sulfa Antibiotics Nausea And Vomiting    FAMILY HISTORY: No family history on file.  SOCIAL HISTORY: Social History   Socioeconomic History   Marital status: Widowed    Spouse name: Not on file   Number of children: 2   Years of education: Not on file   Highest education level: Not on file  Occupational History   Not on file  Tobacco Use   Smoking status: Never   Smokeless tobacco: Never  Substance and Sexual Activity   Alcohol use: No   Drug use: No  Sexual activity: Not Currently  Other Topics Concern   Not on file  Social History Narrative   Right handed   Drinks caffeine   One story home      Son- Nathalie Cavendish, [name].   Daughter- [name]   Social Determinants of Health   Financial Resource Strain: Not on file  Food Insecurity: Not on file  Transportation Needs: Not on file  Physical Activity: Not on file  Stress: Not on file  Social Connections: Not on file  Intimate Partner Violence: Not on file     PHYSICAL EXAM: Vitals:   03/03/21 1438  BP: 129/62  Pulse: 93  SpO2: 93%   General: No acute distress Head:  Normocephalic/atraumatic Skin/Extremities: No rash, no edema Neurological Exam: alert and awake. No aphasia or dysarthria. Fund of knowledge is appropriate.  Recent and remote memory are intact.  Attention and concentration are normal.   Cranial nerves: Pupils equal, round. Extraocular movements intact with no nystagmus. Visual fields full.  No facial asymmetry.  Motor: Bulk and tone normal, muscle strength 5/5 throughout with no pronator drift.   Finger to nose testing intact.  Gait: hunched  posture, slow and cautious, no ataxia   IMPRESSION: This is a pleasant 87 yo RH woman with a history of breast cancer diagnosed with seizures after she had nocturnal episodes where she describes being shook awake one time (no witnesses) but did not see any visible shaking, then recurrent nocturnal episodes of sensory symptoms over the right side. They are not clearly stereotyped and quite brief. Her 72-hour EEG was normal, she has weaned off Lamotrigine. Symptoms felt likely due to underlying sleep disorder, sleep study showed moderate sleep apnea. She declines seeing a sleep specialist to discuss CPAP initiation. She was reporting symptoms felt like they were mostly on the right side, brain MRI unremarkable. MRI cervical spine showed multilevel spondylosis, moderate foraminal narrowing bilaterally at C3-4, C4-5, and C5-6, C6-7, mild central canal narrowing at C3-4 and C4-5. We again discussed that there is no clear evidence of epilepsy at this time, she is now off seizure medication. If nocturnal episodes increase/worsen, would recommend sleep specialist referral. She will follow-up as needed and knows to call for any changes.   Thank you for allowing me to participate in her care.  Please do not hesitate to call for any questions or concerns.    Ellouise Newer, M.D.   CC: Dr. Gena Fray

## 2021-03-07 ENCOUNTER — Telehealth: Payer: Self-pay

## 2021-03-07 NOTE — Telephone Encounter (Signed)
Lori at [facility] [employer] needs to know the day supply for Clotrimazole 1% [phone]  Thank you

## 2021-03-07 NOTE — Telephone Encounter (Signed)
Spoke to pharmacist and gave that 10 days for qty of cream. Dm/cma

## 2021-04-07 ENCOUNTER — Other Ambulatory Visit: Payer: Self-pay

## 2021-04-07 ENCOUNTER — Ambulatory Visit (INDEPENDENT_AMBULATORY_CARE_PROVIDER_SITE_OTHER): Payer: Medicare Other | Admitting: Family Medicine

## 2021-04-07 ENCOUNTER — Encounter: Payer: Self-pay | Admitting: Family Medicine

## 2021-04-07 VITALS — BP 136/58 | HR 97 | Temp 98.0°F | Ht 62.0 in | Wt 136.8 lb

## 2021-04-07 DIAGNOSIS — J301 Allergic rhinitis due to pollen: Secondary | ICD-10-CM | POA: Diagnosis not present

## 2021-04-07 DIAGNOSIS — M47816 Spondylosis without myelopathy or radiculopathy, lumbar region: Secondary | ICD-10-CM

## 2021-04-07 DIAGNOSIS — M47812 Spondylosis without myelopathy or radiculopathy, cervical region: Secondary | ICD-10-CM

## 2021-04-07 NOTE — Progress Notes (Signed)
[facility] [address] [place] [address] Dept: [phone] Dept Fax: [phone]  Chronic Care Office Visit  Subjective:    Patient ID: Anna [name], female    DOB: Oct 06, 1933, 87 y.o..   MRN: [id number]  Chief Complaint  Patient presents with   Follow-up    3 mo f/u.     History of Present Illness:  Patient is in today for reassessment of chronic medical issues.  Anna [name] notes she continues to take daily Zyrtec for her allergic rhinitis. She had seen the ENT and he added Flonase 1 spray each nostril at bedtime. She feels this has helped.  Anna [name] has a history of hair loss. She uses Rogaine for this and feels this has helped.  Anna [name] lives with her son. 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 She is using a cane periodically to assist with ambulation. Her difficulties come from chronic back and joint issues. She does not like to take medication, so avoids taking anything more for pain.  Past Medical History: Patient Active Problem List   Diagnosis Date Noted   Osteoarthritis of multiple joints 01/05/2021   Nonintractable epilepsy with complex partial seizures ([place]) 12/07/2019   Chronic bilateral low back pain without sciatica 12/07/2019   Idiopathic stabbing headache 11/02/2019   Sensorineural hearing loss (SNHL) 08/07/2018   Idiopathic peripheral neuropathy 01/25/2016   Nephrolithiasis 03/29/2015   Postoperative anemia due to acute blood loss 01/06/2015   History of total knee arthroplasty 01/05/2015   Rosacea 07/14/2014   Degenerative joint disease of cervical and lumbar spine 07/14/2014   Microscopic hematuria 07/14/2014   Mammogram abnormal 07/14/2014   History of breast cancer in female 07/14/2014   Chest pain 07/14/2014   Atrophic vaginitis 07/14/2014   Allergic rhinitis 07/14/2014   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   Vitamin D  deficiency 05/18/2014   Osteoporosis 05/18/2014   Mixed hyperlipidemia 05/18/2014   Diverticulosis of large intestine without hemorrhage 05/18/2014   Past Surgical History:  Procedure Laterality Date   ABDOMINAL HYSTERECTOMY     APPENDECTOMY     BACK SURGERY     disc repaired 20 years ago    BREAST SURGERY     double mastectomy    CESAREAN SECTION     times 2   colonscopy      cyst repaired      following mammogram / cyst ruptured had to surgically repair area / left side    DILATION AND CURETTAGE OF UTERUS     EYE SURGERY     bilat cataract surgery    HAND SURGERY     left hand / joint issues with thumb    LUNG SURGERY     related to fatty tumor / left approx 12 years ago    right knee arthroscopy      TOTAL KNEE ARTHROPLASTY Right 01/05/2015   Procedure: RIGHT TOTAL KNEE ARTHROPLASTY;  Surgeon: Latanya Maudlin, MD;  Location: [facility];  Service: Orthopedics;  Laterality: Right;   History reviewed. No pertinent family history.  Outpatient Medications Prior to Visit  Medication Sig Dispense Refill   ascorbic acid (VITAMIN C) 500 MG tablet Take by mouth.     b complex vitamins capsule Take 1 capsule by mouth daily.     cetirizine (ZYRTEC) 10 MG tablet Take by mouth.     Clotrimazole 1 % OINT Apply a small amount of cream to rash twice  a day until resolved. 56.7 g 0   Lifitegrast (XIIDRA) 5 % SOLN Xiidra 5 % eye drops in a dropperette     Lutein 10 MG TABS Take by mouth. Unsure of dose.Takes once a day     Minoxidil (ROGAINE MENS) 5 % FOAM See admin instructions.     Multiple Vitamins-Minerals (PRESERVISION AREDS 2 PO) Take by mouth.     Omega-3 1000 MG CAPS Take by mouth.     No facility-administered medications prior to visit.   Allergies  Allergen Reactions   Demerol [Meperidine] Nausea And Vomiting   Sulfa Antibiotics Nausea And Vomiting   Objective:   Today's Vitals   04/07/21 1356  BP: (!) 136/58  Pulse: 97  Temp: 98 F (36.7 C)  TempSrc: Temporal  SpO2: 96%   Weight: 136 lb 12.8 oz (62.1 kg)  Height: '5\' 2"'$  (1.575 m)   Body mass index is 25.02 kg/m.   General: Well developed, well nourished. No acute distress. Psych: Alert and oriented. Normal mood and affect.  Health Maintenance Due  Topic Date Due   TETANUS/TDAP  Never done   Zoster Vaccines- Shingrix (1 of 2) Never done   DEXA SCAN  Never done   PNA vac Low Risk Adult (1 of 2 - PCV13) Never done   COVID-19 Vaccine (3 - Booster for Pfizer series) 04/11/2020   INFLUENZA VACCINE  03/27/2021     Assessment & Plan:   1. Seasonal allergic rhinitis due to pollen Stable on Zyrtec and Flonase. We did discuss that she could use the Flonase twice a day, if her symptoms are not controlled.  2. Degenerative joint disease of cervical and lumbar spine She will continue to try a\nd stay active. I offered PT, but she has not found this helpful in the past.  [name], MD

## 2021-04-27 ENCOUNTER — Ambulatory Visit (INDEPENDENT_AMBULATORY_CARE_PROVIDER_SITE_OTHER): Payer: Medicare Other | Admitting: *Deleted

## 2021-04-27 DIAGNOSIS — Z0000 Encounter for general adult medical examination without abnormal findings: Secondary | ICD-10-CM

## 2021-04-27 NOTE — Progress Notes (Signed)
Subjective:   Anna [name] is a 87 y.o. female who presents for Medicare Annual (Subsequent) preventive examination.  I connected with  Anna [name] on 04/27/21 by a telephone enabled telemedicine application and verified that I am speaking with the correct person using two identifiers.   I discussed the limitations of evaluation and management by telemedicine. The patient expressed understanding and agreed to proceed.   Review of Systems    NA Cardiac Risk Factors include: advanced age (>71mn, >>32women)     Objective:    Today's Vitals   There is no height or weight on file to calculate BMI.  Advanced Directives 04/27/2021 03/03/2021 10/19/2020 09/06/2020 01/05/2015 01/05/2015 12/28/2014  Does Patient Have a Medical Advance Directive? Yes Yes Yes Yes Yes - Yes  Type of [place] (pink MOST or yellow form);[place] - [place] - Living will;Healthcare Power of Attorney  Does patient want to make changes to medical advance directive? - - - - No - Patient declined No - Patient declined No - Patient declined  Copy of [place] Chart? No - copy requested - - - No - copy requested - No - copy requested    Current Medications (verified) Outpatient Encounter Medications as of 04/27/2021  Medication Sig   ascorbic acid (VITAMIN C) 500 MG tablet Take by mouth.   b complex vitamins capsule Take 1 capsule by mouth daily.   cetirizine (ZYRTEC) 10 MG tablet Take by mouth.   Clotrimazole 1 % OINT Apply a small amount of cream to rash twice a day until resolved.   fluticasone (FLONASE) 50 MCG/ACT nasal spray Place 1 spray into both nostrils at bedtime.   Lifitegrast (XIIDRA) 5 % SOLN Xiidra 5 % eye drops in a dropperette   Lutein 10 MG TABS Take by mouth. Unsure of dose.Takes once a day   Minoxidil (ROGAINE MENS) 5 % FOAM See admin  instructions.   Multiple Vitamins-Minerals (PRESERVISION AREDS 2 PO) Take by mouth.   Omega-3 1000 MG CAPS Take by mouth.   No facility-administered encounter medications on file as of 04/27/2021.    Allergies (verified) Demerol [meperidine] and Sulfa antibiotics   History: Past Medical History:  Diagnosis Date   Allergy    seasonal also with dogs and cats   Arthritis    Asthma    hx of in childhood    Bladder infection    hx of    [place]    breast cancer bilat has had double mastectomy    Chronic kidney disease    hx of kidney stones last one pasted 1 month ago    COVID    Fibrocystic breast disease    H/O blood clots    during delivery occurred 50 years ago per left arm    PONV (postoperative nausea and vomiting)    pt states stays very sleepy for days following anesthesia    Past Surgical History:  Procedure Laterality Date   ABDOMINAL HYSTERECTOMY     APPENDECTOMY     BACK SURGERY     disc repaired 20 years ago    BREAST SURGERY     double mastectomy    CESAREAN SECTION     times 2   colonscopy      cyst repaired      following mammogram / cyst ruptured had to surgically repair area / left  side    DILATION AND CURETTAGE OF UTERUS     EYE SURGERY     bilat cataract surgery    HAND SURGERY     left hand / joint issues with thumb    LUNG SURGERY     related to fatty tumor / left approx 12 years ago    right knee arthroscopy      TOTAL KNEE ARTHROPLASTY Right 01/05/2015   Procedure: RIGHT TOTAL KNEE ARTHROPLASTY;  Surgeon: Latanya Maudlin, MD;  Location: [facility];  Service: Orthopedics;  Laterality: Right;   History reviewed. No pertinent family history. Social History   Socioeconomic History   Marital status: Widowed    Spouse name: Not on file   Number of children: 2   Years of education: Not on file   Highest education level: Not on file  Occupational History   Not on file  Tobacco Use   Smoking status: Never   Smokeless tobacco: Never  Vaping  Use   Vaping Use: Never used  Substance and Sexual Activity   Alcohol use: No   Drug use: No   Sexual activity: Not Currently  Other Topics Concern   Not on file  Social History Narrative   Right handed   Drinks caffeine   One story home      Son- Anna [name], Anna [name].   Daughter- Anna [name]   Social Determinants of Health   Financial Resource Strain: Low Risk    Difficulty of Paying Living Expenses: Not hard at all  Food Insecurity: No Food Insecurity   Worried About Charity fundraiser in the Last Year: Never true   Arboriculturist in the Last Year: Never true  Transportation Needs: No Transportation Needs   Lack of Transportation (Medical): No   Lack of Transportation (Non-Medical): No  Physical Activity: Inactive   Days of Exercise per Week: 0 days   Minutes of Exercise per Session: 0 min  Stress: No Stress Concern Present   Feeling of Stress : Not at all  Social Connections: Moderately Isolated   Frequency of Communication with Friends and Family: More than three times a week   Frequency of Social Gatherings with Friends and Family: More than three times a week   Attends Religious Services: More than 4 times per year   Active Member of [employer] or Organizations: No   Attends Archivist Meetings: Never   Marital Status: Widowed    Tobacco Counseling Counseling given: Not Answered   Clinical Intake:  Pre-visit preparation completed: Yes  Pain : No/denies pain     Nutritional Risks: None Diabetes: No  How often do you need to have someone help you when you read instructions, pamphlets, or other written materials from your doctor or pharmacy?: 1 - Never  Diabetic?NO  Interpreter Needed?: No  Information entered by :: Leroy Kennedy LPN   Activities of Daily Living In your present state of health, do you have any difficulty performing the following activities: 04/27/2021  Hearing? Y  Vision? N  Difficulty concentrating or making decisions? N   Walking or climbing stairs? N  Dressing or bathing? N  Doing errands, shopping? N  Preparing Food and eating ? N  Using the Toilet? N  In the past six months, have you accidently leaked urine? Y  Comment wears pad  Do you have problems with loss of bowel control? N  Managing your Medications? N  Managing your Finances? N  Housekeeping or managing your Housekeeping?  N  Some recent data might be hidden    Patient Care Team: [name], MD as PCP - General (Family Medicine) Cameron Sprang, MD as Consulting Physician (Neurology) Felipa Eth, Reece Leader., MD as Referring Physician (Urology) Maggie Schwalbe., MD (Otolaryngology) Amedeo Kinsman, OD (Optometry) Jari Pigg, MD as Consulting Physician (Dermatology) Rozetta Nunnery, MD as Consulting Physician (Otolaryngology)  Indicate any recent Medical Services you may have received from other than [facility] providers in the past year (date may be approximate).     Assessment:   This is a routine wellness examination for Esma.  Hearing/Vision screen Hearing Screening - Comments:: Hearing aids in both ears Vision Screening - Comments:: Up to date Dr. Teola Bradley  Dietary issues and exercise activities discussed: Current Exercise Habits: The patient does not participate in regular exercise at present, Exercise limited by: None identified   Goals Addressed             This Visit's Progress    Patient Stated       Maintain current lifestyle       Depression Screen PHQ 2/9 Scores 04/27/2021 01/05/2021  PHQ - 2 Score 0 0    Fall Risk Fall Risk  04/27/2021 03/03/2021 01/05/2021 10/19/2020 09/06/2020  Falls in the past year? 0 0 1 0 0  Number falls in past yr: 0 0 1 0 0  Injury with Fall? 0 0 0 0 0  Follow up Falls evaluation completed;Falls prevention discussed - - - -    FALL RISK PREVENTION PERTAINING TO THE HOME:  Any stairs in or around the home? Yes  If so, are there any without handrails? No  Home free of  loose throw rugs in walkways, pet beds, electrical cords, etc? Yes  Adequate lighting in your home to reduce risk of falls? Yes   ASSISTIVE DEVICES UTILIZED TO PREVENT FALLS:  Life alert? Yes  Use of a cane, walker or w/c? Yes  Grab bars in the bathroom? Yes  Shower chair or bench in shower? Yes  Elevated toilet seat or a handicapped toilet? No   TIMED UP AND GO:  Was the test performed? No .   Cognitive Function:  Normal cognitive status assessed by direct observation by this Nurse Health Advisor. No abnormalities found.          Immunizations Immunization History  Administered Date(s) Administered   PFIZER(Purple Top)SARS-COV-2 Vaccination 10/06/2019, 11/10/2019    TDAP status: Due, Education has been provided regarding the importance of this vaccine. Advised may receive this vaccine at local pharmacy or Health Dept. Aware to provide a copy of the vaccination record if obtained from local pharmacy or Health Dept. Verbalized acceptance and understanding.  Flu Vaccine status: Due, Education has been provided regarding the importance of this vaccine. Advised may receive this vaccine at local pharmacy or Health Dept. Aware to provide a copy of the vaccination record if obtained from local pharmacy or Health Dept. Verbalized acceptance and understanding.  Pneumococcal vaccine status: Declined,  Education has been provided regarding the importance of this vaccine but patient still declined. Advised may receive this vaccine at local pharmacy or Health Dept. Aware to provide a copy of the vaccination record if obtained from local pharmacy or Health Dept. Verbalized acceptance and understanding.   Covid-19 vaccine status: Information provided on how to obtain vaccines.   Qualifies for Shingles Vaccine? Yes   Zostavax completed No   Shingrix Completed?: No.    Education has been provided  regarding the importance of this vaccine. Patient has been advised to call insurance company to  determine out of pocket expense if they have not yet received this vaccine. Advised may also receive vaccine at local pharmacy or Health Dept. Verbalized acceptance and understanding.  Screening Tests Health Maintenance  Topic Date Due   TETANUS/TDAP  Never done   Zoster Vaccines- Shingrix (1 of 2) Never done   DEXA SCAN  Never done   PNA vac Low Risk Adult (1 of 2 - PCV13) Never done   COVID-19 Vaccine (3 - Booster for Pfizer series) 04/11/2020   INFLUENZA VACCINE  03/27/2021   HPV VACCINES  Aged Out    Health Maintenance  Health Maintenance Due  Topic Date Due   TETANUS/TDAP  Never done   Zoster Vaccines- Shingrix (1 of 2) Never done   DEXA SCAN  Never done   PNA vac Low Risk Adult (1 of 2 - PCV13) Never done   COVID-19 Vaccine (3 - Booster for Pfizer series) 04/11/2020   INFLUENZA VACCINE  03/27/2021    Colorectal cancer screening: No longer required.   Mammogram status: No longer required due to age.  Bone Density-  Declined  Lung Cancer Screening: (Low Dose CT Chest recommended if Age 4-80 years, 30 pack-year currently smoking OR have quit w/in 15years.) does not qualify.   Lung Cancer Screening Referral:   Additional Screening:  Hepatitis C Screening: does not qualify;  Vision Screening: Recommended annual ophthalmology exams for early detection of glaucoma and other disorders of the eye. Is the patient up to date with their annual eye exam?  Yes  Who is the provider or what is the name of the office in which the patient attends annual eye exams? Dr. Sharen Counter If pt is not established with a provider, would they like to be referred to a provider to establish care? No .   Dental Screening: Recommended annual dental exams for proper oral hygiene  Community Resource Referral / Chronic Care Management: CRR required this visit?  No   CCM required this visit?  No      Plan:     I have personally reviewed and noted the following in the patient's chart:   Medical  and social history Use of alcohol, tobacco or illicit drugs  Current medications and supplements including opioid prescriptions.  Functional ability and status Nutritional status Physical activity Advanced directives List of other physicians Hospitalizations, surgeries, and ER visits in previous 12 months Vitals Screenings to include cognitive, depression, and falls Referrals and appointments  In addition, I have reviewed and discussed with patient certain preventive protocols, quality metrics, and best practice recommendations. A written personalized care plan for preventive services as well as general preventive health recommendations were provided to patient.     Leroy Kennedy, LPN   [id number]   Nurse Notes:

## 2021-04-27 NOTE — Patient Instructions (Signed)
Anna [name] , Thank you for taking time to come for your Medicare Wellness Visit. I appreciate your ongoing commitment to your health goals. Please review the following plan we discussed and let me know if I can assist you in the future.   Screening recommendations/referrals: Colonoscopy: no longer required Mammogram: no longer required Bone Density: Education provided Recommended yearly ophthalmology/optometry visit for glaucoma screening and checkup Recommended yearly dental visit for hygiene and checkup  Vaccinations: Influenza vaccine: Education provided Pneumococcal vaccine: Education provided Tdap vaccine: Education provided Shingles vaccine: Education provided    Advanced directives: copy requested   Conditions/risks identified:   Next appointment: 10-09-2020 1:00 Dr. Gena Fray   Preventive Care 85 Years and Older, Female Preventive care refers to lifestyle choices and visits with your health care provider that can promote health and wellness. What does preventive care include? A yearly physical exam. This is also called an annual well check. Dental exams once or twice a year. Routine eye exams. Ask your health care provider how often you should have your eyes checked. Personal lifestyle choices, including: Daily care of your teeth and gums. Regular physical activity. Eating a healthy diet. Avoiding tobacco and drug use. Limiting alcohol use. Practicing safe sex. Taking low-dose aspirin every day. Taking vitamin and mineral supplements as recommended by your health care provider. What happens during an annual well check? The services and screenings done by your health care provider during your annual well check will depend on your age, overall health, lifestyle risk factors, and family history of disease. Counseling  Your health care provider may ask you questions about your: Alcohol use. Tobacco use. Drug use. Emotional well-being. Home and relationship  well-being. Sexual activity. Eating habits. History of falls. Memory and ability to understand (cognition). Work and work Statistician. Reproductive health. Screening  You may have the following tests or measurements: Height, weight, and BMI. Blood pressure. Lipid and cholesterol levels. These may be checked every 5 years, or more frequently if you are over 56 years old. Skin check. Lung cancer screening. You may have this screening every year starting at age 20 if you have a 30-pack-year history of smoking and currently smoke or have quit within the past 15 years. Fecal occult blood test (FOBT) of the stool. You may have this test every year starting at age 64. Flexible sigmoidoscopy or colonoscopy. You may have a sigmoidoscopy every 5 years or a colonoscopy every 10 years starting at age 42. Hepatitis C blood test. Hepatitis B blood test. Sexually transmitted disease (STD) testing. Diabetes screening. This is done by checking your blood sugar (glucose) after you have not eaten for a while (fasting). You may have this done every 1-3 years. Bone density scan. This is done to screen for osteoporosis. You may have this done starting at age 31. Mammogram. This may be done every 1-2 years. Talk to your health care provider about how often you should have regular mammograms. Talk with your health care provider about your test results, treatment options, and if necessary, the need for more tests. Vaccines  Your health care provider may recommend certain vaccines, such as: Influenza vaccine. This is recommended every year. Tetanus, diphtheria, and acellular pertussis (Tdap, Td) vaccine. You may need a Td booster every 10 years. Zoster vaccine. You may need this after age 37. Pneumococcal 13-valent conjugate (PCV13) vaccine. One dose is recommended after age 67. Pneumococcal polysaccharide (PPSV23) vaccine. One dose is recommended after age 25. Talk to your health care provider about which  screenings and vaccines you need and how often you need them. This information is not intended to replace advice given to you by your health care provider. Make sure you discuss any questions you have with your health care provider. Document Released: 09/09/2015 Document Revised: 05/02/2016 Document Reviewed: 06/14/2015 Elsevier Interactive Patient Education  2017 [place] Prevention in the Home Falls can cause injuries. They can happen to people of all ages. There are many things you can do to make your home safe and to help prevent falls. What can I do on the outside of my home? Regularly fix the edges of walkways and driveways and fix any cracks. Remove anything that might make you trip as you walk through a door, such as a raised step or threshold. Trim any bushes or trees on the path to your home. Use bright outdoor lighting. Clear any walking paths of anything that might make someone trip, such as rocks or tools. Regularly check to see if handrails are loose or broken. Make sure that both sides of any steps have handrails. Any raised decks and porches should have guardrails on the edges. Have any leaves, snow, or ice cleared regularly. Use sand or salt on walking paths during winter. Clean up any spills in your garage right away. This includes oil or grease spills. What can I do in the bathroom? Use night lights. Install grab bars by the toilet and in the tub and shower. Do not use towel bars as grab bars. Use non-skid mats or decals in the tub or shower. If you need to sit down in the shower, use a plastic, non-slip stool. Keep the floor dry. Clean up any water that spills on the floor as soon as it happens. Remove soap buildup in the tub or shower regularly. Attach bath mats securely with double-sided non-slip rug tape. Do not have throw rugs and other things on the floor that can make you trip. What can I do in the bedroom? Use night lights. Make sure that you have a  light by your bed that is easy to reach. Do not use any sheets or blankets that are too big for your bed. They should not hang down onto the floor. Have a firm chair that has side arms. You can use this for support while you get dressed. Do not have throw rugs and other things on the floor that can make you trip. What can I do in the kitchen? Clean up any spills right away. Avoid walking on wet floors. Keep items that you use a lot in easy-to-reach places. If you need to reach something above you, use a strong step stool that has a grab bar. Keep electrical cords out of the way. Do not use floor polish or wax that makes floors slippery. If you must use wax, use non-skid floor wax. Do not have throw rugs and other things on the floor that can make you trip. What can I do with my stairs? Do not leave any items on the stairs. Make sure that there are handrails on both sides of the stairs and use them. Fix handrails that are broken or loose. Make sure that handrails are as long as the stairways. Check any carpeting to make sure that it is firmly attached to the stairs. Fix any carpet that is loose or worn. Avoid having throw rugs at the top or bottom of the stairs. If you do have throw rugs, attach them to the floor with carpet tape.  Make sure that you have a light switch at the top of the stairs and the bottom of the stairs. If you do not have them, ask someone to add them for you. What else can I do to help prevent falls? Wear shoes that: Do not have high heels. Have rubber bottoms. Are comfortable and fit you well. Are closed at the toe. Do not wear sandals. If you use a stepladder: Make sure that it is fully opened. Do not climb a closed stepladder. Make sure that both sides of the stepladder are locked into place. Ask someone to hold it for you, if possible. Clearly mark and make sure that you can see: Any grab bars or handrails. First and last steps. Where the edge of each step  is. Use tools that help you move around (mobility aids) if they are needed. These include: Canes. Walkers. Scooters. Crutches. Turn on the lights when you go into a dark area. Replace any light bulbs as soon as they burn out. Set up your furniture so you have a clear path. Avoid moving your furniture around. If any of your floors are uneven, fix them. If there are any pets around you, be aware of where they are. Review your medicines with your doctor. Some medicines can make you feel dizzy. This can increase your chance of falling. Ask your doctor what other things that you can do to help prevent falls. This information is not intended to replace advice given to you by your health care provider. Make sure you discuss any questions you have with your health care provider. Document Released: 06/09/2009 Document Revised: 01/19/2016 Document Reviewed: 09/17/2014 Elsevier Interactive Patient Education  2017 [employer].

## 2021-06-01 ENCOUNTER — Encounter (INDEPENDENT_AMBULATORY_CARE_PROVIDER_SITE_OTHER): Payer: Self-pay

## 2021-09-21 ENCOUNTER — Telehealth: Payer: Self-pay | Admitting: Family Medicine

## 2021-09-21 NOTE — Telephone Encounter (Signed)
Pt requesting referral to [facility].

## 2021-09-21 NOTE — Telephone Encounter (Signed)
Referral moved to   [facility] [place], [place] [address] Get Directions Appointments [phone] Office [phone] Fax [phone]

## 2021-09-22 NOTE — Telephone Encounter (Signed)
Patient notified VIA phone and given their phone number to be able to call them. Dm/cma

## 2021-10-06 ENCOUNTER — Other Ambulatory Visit: Payer: Self-pay

## 2021-10-09 ENCOUNTER — Ambulatory Visit (INDEPENDENT_AMBULATORY_CARE_PROVIDER_SITE_OTHER): Payer: Medicare Other | Admitting: Family Medicine

## 2021-10-09 ENCOUNTER — Other Ambulatory Visit: Payer: Self-pay

## 2021-10-09 VITALS — BP 130/72 | HR 90 | Temp 98.0°F | Ht 62.0 in | Wt 135.4 lb

## 2021-10-09 DIAGNOSIS — C44519 Basal cell carcinoma of skin of other part of trunk: Secondary | ICD-10-CM | POA: Insufficient documentation

## 2021-10-09 DIAGNOSIS — M47816 Spondylosis without myelopathy or radiculopathy, lumbar region: Secondary | ICD-10-CM | POA: Diagnosis not present

## 2021-10-09 DIAGNOSIS — Z85828 Personal history of other malignant neoplasm of skin: Secondary | ICD-10-CM | POA: Insufficient documentation

## 2021-10-09 DIAGNOSIS — M47812 Spondylosis without myelopathy or radiculopathy, cervical region: Secondary | ICD-10-CM

## 2021-10-09 DIAGNOSIS — J301 Allergic rhinitis due to pollen: Secondary | ICD-10-CM | POA: Diagnosis not present

## 2021-10-09 DIAGNOSIS — C44311 Basal cell carcinoma of skin of nose: Secondary | ICD-10-CM | POA: Insufficient documentation

## 2021-10-09 DIAGNOSIS — M810 Age-related osteoporosis without current pathological fracture: Secondary | ICD-10-CM

## 2021-10-09 MED ORDER — ALENDRONATE SODIUM 70 MG PO TABS: 70.0000 mg | ORAL_TABLET | ORAL | 11 refills | Status: DC

## 2021-10-09 MED ORDER — IPRATROPIUM BROMIDE 0.03 % NA SOLN: 2.0000 | Freq: Two times a day (BID) | NASAL | 12 refills | Status: DC

## 2021-10-09 NOTE — Progress Notes (Signed)
[facility] [address] [place] [address] Dept: [phone] Dept Fax: [phone]  Chronic Care Office Visit  Subjective:    Patient ID: Anna [name], female    DOB: 1933/11/05, 88 y.o..   MRN: [id number]  Chief Complaint  Patient presents with   Follow-up    6 month f/u.  No concerns.  Declines flu shot today.     History of Present Illness:  Patient is in today for reassessment of chronic medical issues.  [name] has a history of allergic rhinitis. She is managed on Zyrtec and once daily Nasocort. Despite this she still notes some frequent rhinorrhea issues.   [name] has a history of cervical and lumbar disc issues. She notes her lower back bothers her more frequently. She had surgery on this some years ago. She also has had previous ESIs. She was offered nerve ablation and but did not feel that would be helpful. She notes some progression of kyphosis as well. She did engage in physical therapy about 6 months ago. She is not interested in repeating this.  Past Medical History: Patient Active Problem List   Diagnosis Date Noted   History of basal cell carcinoma (BCC) 10/09/2021   Osteoarthritis of multiple joints 01/05/2021   Nonintractable epilepsy with complex partial seizures ([place]) 12/07/2019   Chronic bilateral low back pain without sciatica 12/07/2019   Idiopathic stabbing headache 11/02/2019   Sensorineural hearing loss (SNHL) 08/07/2018   Idiopathic peripheral neuropathy 01/25/2016   Nephrolithiasis 03/29/2015   Postoperative anemia due to acute blood loss 01/06/2015   History of total knee arthroplasty 01/05/2015   Rosacea 07/14/2014   Degenerative joint disease of cervical and lumbar spine 07/14/2014   Microscopic hematuria 07/14/2014   Mammogram abnormal 07/14/2014   History of breast cancer in female 07/14/2014   Chest pain 07/14/2014   Atrophic vaginitis 07/14/2014   Allergic rhinitis  07/14/2014   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   Vitamin D deficiency 05/18/2014   Osteoporosis 05/18/2014   Mixed hyperlipidemia 05/18/2014   Diverticulosis of large intestine without hemorrhage 05/18/2014   Past Surgical History:  Procedure Laterality Date   ABDOMINAL HYSTERECTOMY     APPENDECTOMY     BACK SURGERY     disc repaired 20 years ago    BREAST SURGERY     double mastectomy    CESAREAN SECTION     times 2   colonscopy      cyst repaired      following mammogram / cyst ruptured had to surgically repair area / left side    DILATION AND CURETTAGE OF UTERUS     EYE SURGERY     bilat cataract surgery    HAND SURGERY     left hand / joint issues with thumb    LUNG SURGERY     related to fatty tumor / left approx 12 years ago    right knee arthroscopy      TOTAL KNEE ARTHROPLASTY Right 01/05/2015   Procedure: RIGHT TOTAL KNEE ARTHROPLASTY;  Surgeon: Latanya Maudlin, MD;  Location: [facility];  Service: Orthopedics;  Laterality: Right;   No family history on file.  Outpatient Medications Prior to Visit  Medication Sig Dispense Refill   ascorbic acid (VITAMIN C) 500 MG tablet Take by mouth.     b complex vitamins capsule Take 1 capsule by mouth daily.     cetirizine (ZYRTEC) 10 MG tablet Take by mouth.     Clotrimazole 1 % OINT  Apply a small amount of cream to rash twice a day until resolved. 56.7 g 0   fluticasone (FLONASE) 50 MCG/ACT nasal spray Place 1 spray into both nostrils at bedtime.     Lifitegrast (XIIDRA) 5 % SOLN Xiidra 5 % eye drops in a dropperette     Lutein 10 MG TABS Take by mouth. Unsure of dose.Takes once a day     Minoxidil (ROGAINE MENS) 5 % FOAM See admin instructions.     Multiple Vitamins-Minerals (PRESERVISION AREDS 2 PO) Take by mouth.     Omega-3 1000 MG CAPS Take by mouth.     No facility-administered medications prior to visit.   Allergies  Allergen Reactions   Demerol [Meperidine] Nausea And Vomiting   Sulfa Antibiotics Nausea And Vomiting      Objective:   Today's Vitals   10/09/21 1307  BP: 130/72  Pulse: 90  Temp: 98 F (36.7 C)  TempSrc: Temporal  SpO2: 98%  Weight: 135 lb 6.4 oz (61.4 kg)  Height: 5\' 2"  (1.575 m)   Body mass index is 24.76 kg/m.   General: Well developed, well nourished. No acute distress. Psych: Alert and oriented. Normal mood and affect.  Health Maintenance Due  Topic Date Due   TETANUS/TDAP  Never done   Zoster Vaccines- Shingrix (1 of 2) Never done   Pneumonia Vaccine 26+ Years old (1 - PCV) Never done   DEXA SCAN  Never done   COVID-19 Vaccine (3 - Pfizer risk series) 12/08/2019   INFLUENZA VACCINE  03/27/2021     Assessment & Plan:   1. Seasonal allergic rhinitis due to pollen I will try adding some ipratropium to see if this will dry up some of the rhinorrhea. She will cotninue her Zyrtec and nasal steroid spray.  - ipratropium (ATROVENT) 0.03 % nasal spray; Place 2 sprays into both nostrils every 12 (twelve) hours.  Dispense: 30 mL; Refill: 12  2. Age-related osteoporosis without current pathological fracture She has a history of osteoporosis. She notes she was advised not to take calcium due to risk for kidney stones. I told her that there is not an association between kidney stones and calcium supplementation. As she notes some progressive kyphosis, I do believe she is at risk for fractures. I recommend we start her on Fosamax. I reviewed precautions about taking this with a full glass of water and avoidance of lying down for 30-60 minutes.  - alendronate (FOSAMAX) 70 MG tablet; Take 1 tablet (70 mg total) by mouth every 7 (seven) days. Take with a full glass of water on an empty stomach.  Dispense: 4 tablet; Refill: 11  3. Degenerative joint disease of cervical and lumbar spine Discussed ongoing conservative measures. I recommend she consider a trial of Salonpas to see if this will provide any additional relief.  [name], MD

## 2021-10-16 ENCOUNTER — Telehealth: Payer: Self-pay | Admitting: Family Medicine

## 2021-10-16 NOTE — Telephone Encounter (Signed)
pt has a UTI she had a couple pills left from last time. She has requested if DR Gena Fray can send in another script for Cipro to get rid of the uti.

## 2021-10-16 NOTE — Telephone Encounter (Signed)
Patient notified VIA phone that she will need an appointment to get an antibiotic. She will call us back to schedule.   Dm/cma

## 2021-11-24 ENCOUNTER — Ambulatory Visit (INDEPENDENT_AMBULATORY_CARE_PROVIDER_SITE_OTHER): Payer: Medicare Other | Admitting: Family Medicine

## 2021-11-24 VITALS — BP 120/70 | HR 100 | Temp 97.4°F | Ht 62.0 in | Wt 134.2 lb

## 2021-11-24 DIAGNOSIS — R300 Dysuria: Secondary | ICD-10-CM

## 2021-11-24 DIAGNOSIS — N958 Other specified menopausal and perimenopausal disorders: Secondary | ICD-10-CM

## 2021-11-24 DIAGNOSIS — M4003 Postural kyphosis, cervicothoracic region: Secondary | ICD-10-CM

## 2021-11-24 DIAGNOSIS — M810 Age-related osteoporosis without current pathological fracture: Secondary | ICD-10-CM

## 2021-11-24 LAB — POCT URINALYSIS DIPSTICK
Bilirubin, UA: NEGATIVE
Blood, UA: NEGATIVE
Glucose, UA: NEGATIVE
Ketones, UA: NEGATIVE
Leukocytes, UA: NEGATIVE
Nitrite, UA: NEGATIVE
Protein, UA: NEGATIVE
Spec Grav, UA: 1.01 (ref 1.010–1.025)
Urobilinogen, UA: 0.2 E.U./dL
pH, UA: 6 (ref 5.0–8.0)

## 2021-11-24 MED ORDER — ESTRADIOL 0.1 MG/GM VA CREA: TOPICAL_CREAM | VAGINAL | 12 refills | Status: DC

## 2021-11-24 NOTE — Progress Notes (Signed)
?[facility] ?[facility] ?[place] ?[place] [address] ?Dept: [phone] ?Dept Fax: [phone] ? ?Office Visit ? ?Subjective:  ? ? Patient ID: Anna [name], female    DOB: 1934/06/10, 88 y.o..   MRN: [id number] ? ?Chief Complaint  ?Patient presents with  ? Acute Visit  ?  C/o having pain with urination and frequency x 2 days.    ? ? ?History of Present Illness: ? ?Patient is in today complaining of a 2-day history of dysuria. She notes that she has had prior UTIs. She typically starts with a sensitivity in her perineum, suprapubic pain, dysuria, and frequency. She notes it was quite painful to urinate last night. She increased her fluid intake overnight and feels her symptoms are better. ? ?MS. Lindquist was due for a follow-up with me in 2 weeks. she notes she is overall doing well. She does complain of trouble standing up straight. She has a history of osteoporosis. She also notes prior lumbar disc issues and scoliosis. She has engaged in PT int he past, but she did not feel this was very helpful. ? ?Past Medical History: ?Patient Active Problem List  ? Diagnosis Date Noted  ? History of basal cell carcinoma (BCC) 10/09/2021  ? Osteoarthritis of multiple joints 01/05/2021  ? Nonintractable epilepsy with complex partial seizures ([place]) 12/07/2019  ? Chronic bilateral low back pain without sciatica 12/07/2019  ? Idiopathic stabbing headache 11/02/2019  ? Sensorineural hearing loss (SNHL) 08/07/2018  ? Idiopathic peripheral neuropathy 01/25/2016  ? Nephrolithiasis 03/29/2015  ? Postoperative anemia due to acute blood loss 01/06/2015  ? History of total knee arthroplasty 01/05/2015  ? Rosacea 07/14/2014  ? Degenerative joint disease of cervical and lumbar spine 07/14/2014  ? Microscopic hematuria 07/14/2014  ? Mammogram abnormal 07/14/2014  ? History of breast cancer in female 07/14/2014  ? Chest pain 07/14/2014  ? Atrophic vaginitis 07/14/2014  ? Allergic rhinitis  07/14/2014  ? 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  ? Vitamin D deficiency 05/18/2014  ? Osteoporosis 05/18/2014  ? Mixed hyperlipidemia 05/18/2014  ? Diverticulosis of large intestine without hemorrhage 05/18/2014  ? ?Past Surgical History:  ?Procedure Laterality Date  ? ABDOMINAL HYSTERECTOMY    ? APPENDECTOMY    ? BACK SURGERY    ? disc repaired 20 years ago   ? BREAST SURGERY    ? double mastectomy   ? CESAREAN SECTION    ? times 2  ? colonscopy     ? cyst repaired     ? following mammogram / cyst ruptured had to surgically repair area / left side   ? DILATION AND CURETTAGE OF UTERUS    ? EYE SURGERY    ? bilat cataract surgery   ? HAND SURGERY    ? left hand / joint issues with thumb   ? LUNG SURGERY    ? related to fatty tumor / left approx 12 years ago   ? right knee arthroscopy     ? TOTAL KNEE ARTHROPLASTY Right 01/05/2015  ? Procedure: RIGHT TOTAL KNEE ARTHROPLASTY;  Surgeon: Latanya Maudlin, MD;  Location: [facility];  Service: Orthopedics;  Laterality: Right;  ? ?No family history on file. ? ?Outpatient Medications Prior to Visit  ?Medication Sig Dispense Refill  ? alendronate (FOSAMAX) 70 MG tablet Take 1 tablet (70 mg total) by mouth every 7 (seven) days. Take with a full glass of water on an empty stomach. 4 tablet 11  ? ascorbic acid (VITAMIN C) 500 MG tablet Take by  mouth.    ? b complex vitamins capsule Take 1 capsule by mouth daily.    ? cetirizine (ZYRTEC) 10 MG tablet Take by mouth.    ? Clotrimazole 1 % OINT Apply a small amount of cream to rash twice a day until resolved. 56.7 g 0  ? fluticasone (FLONASE) 50 MCG/ACT nasal spray Place 1 spray into both nostrils at bedtime.    ? ipratropium (ATROVENT) 0.03 % nasal spray Place 2 sprays into both nostrils every 12 (twelve) hours. 30 mL 12  ? Lifitegrast (XIIDRA) 5 % SOLN Xiidra 5 % eye drops in a dropperette    ? Lutein 10 MG TABS Take by mouth. Unsure of dose.Takes once a day    ? Minoxidil (ROGAINE MENS) 5 % FOAM See admin instructions.    ? Multiple  Vitamins-Minerals (PRESERVISION AREDS 2 PO) Take by mouth.    ? Omega-3 1000 MG CAPS Take by mouth.    ? ?No facility-administered medications prior to visit.  ? ?Allergies  ?Allergen Reactions  ? Demerol [Meperidine] Nausea And Vomiting  ? Sulfa Antibiotics Nausea And Vomiting  ?   ?Objective:  ? ?Today's Vitals  ? 11/24/21 1109  ?BP: 120/70  ?Pulse: 100  ?Temp: (!) 97.4 ?F (36.3 ?C)  ?TempSrc: Temporal  ?SpO2: 98%  ?Weight: 134 lb 3.2 oz (60.9 kg)  ?Height: '5\' 2"'$  (1.575 m)  ? ?Body mass index is 24.55 kg/m?.  ? ?General: Well developed, well nourished. No acute distress. ?Back: There is moderate kyphosis of the cervicothoracic spine. ?Psych: Alert and oriented. Normal mood and affect. ? ?Health Maintenance Due  ?Topic Date Due  ? TETANUS/TDAP  Never done  ? Zoster Vaccines- Shingrix (1 of 2) Never done  ? Pneumonia Vaccine 8+ Years old (1 - PCV) Never done  ? DEXA SCAN  Never done  ?   ?Lab Results: ?Urine dipstick shows negative for all components. ? ?Assessment & Plan:  ? ?1. Dysuria ?There eis no sign of an acute infection and no indication for antibiotics. ? ?- POCT Urinalysis Dipstick ? ?2. Genitourinary syndrome of menopause ?Ms. Moralez ahs a past history of atrophic vaginitis. I suspect that this is the underlying cause of her symptoms. I will prescribe and estradiol cream for daily use for 2 weeks, then twice weekly. ? ?- estradiol (ESTRACE) 0.1 MG/GM vaginal cream; Place 1 Applicatorful vaginally at bedtime for 14 days, THEN 1 Applicatorful 2 (two) times a week.  Dispense: 42.5 g; Refill: 12 ? ?3. Postural kyphosis of cervicothoracic region ?4. Age-related osteoporosis without current pathological fracture ?Ms. Carrero has some moderate kyphosis, likely related to her osteoporosis. She is on Fosamax. I recommended she continue tot ry and work at home exercises, previously provided by PT. I do not feel there is a need for any orthopedic or surgical eval at present. ? ?Return in about 6 months (around  05/26/2022) for Reassessment.  ? ?[name], MD ?

## 2022-02-10 ENCOUNTER — Emergency Department (HOSPITAL_BASED_OUTPATIENT_CLINIC_OR_DEPARTMENT_OTHER): Payer: Medicare Other

## 2022-02-10 ENCOUNTER — Emergency Department (HOSPITAL_BASED_OUTPATIENT_CLINIC_OR_DEPARTMENT_OTHER)
Admission: EM | Admit: 2022-02-10 | Discharge: 2022-02-10 | Disposition: A | Discharge status: Home / Self Care | Payer: Medicare Other | Attending: Emergency Medicine | Admitting: Emergency Medicine

## 2022-02-10 ENCOUNTER — Encounter (HOSPITAL_BASED_OUTPATIENT_CLINIC_OR_DEPARTMENT_OTHER): Payer: Self-pay | Admitting: Emergency Medicine

## 2022-02-10 ENCOUNTER — Other Ambulatory Visit: Payer: Self-pay

## 2022-02-10 DIAGNOSIS — W19XXXA Unspecified fall, initial encounter: Secondary | ICD-10-CM | POA: Insufficient documentation

## 2022-02-10 DIAGNOSIS — Z23 Encounter for immunization: Secondary | ICD-10-CM | POA: Diagnosis not present

## 2022-02-10 DIAGNOSIS — T148XXA Other injury of unspecified body region, initial encounter: Secondary | ICD-10-CM

## 2022-02-10 DIAGNOSIS — S81812A Laceration without foreign body, left lower leg, initial encounter: Secondary | ICD-10-CM | POA: Insufficient documentation

## 2022-02-10 DIAGNOSIS — S80812A Abrasion, left lower leg, initial encounter: Secondary | ICD-10-CM | POA: Insufficient documentation

## 2022-02-10 DIAGNOSIS — S8992XA Unspecified injury of left lower leg, initial encounter: Secondary | ICD-10-CM | POA: Diagnosis present

## 2022-02-10 MED ORDER — DOXYCYCLINE HYCLATE 100 MG PO CAPS: 100.0000 mg | ORAL_CAPSULE | Freq: Two times a day (BID) | ORAL | 0 refills | Status: AC

## 2022-02-10 MED ORDER — BACITRACIN ZINC 500 UNIT/GM EX OINT
TOPICAL_OINTMENT | Freq: Two times a day (BID) | CUTANEOUS | Status: DC
  Filled 2022-02-10: qty 28.35

## 2022-02-10 MED ORDER — DOXYCYCLINE HYCLATE 100 MG PO TABS
100.0000 mg | ORAL_TABLET | Freq: Once | ORAL | Status: AC
  Administered 2022-02-10: 100 mg via ORAL
  Filled 2022-02-10: qty 1

## 2022-02-10 MED ORDER — TETANUS-DIPHTH-ACELL PERTUSSIS 5-2.5-18.5 LF-MCG/0.5 IM SUSY
0.5000 mL | PREFILLED_SYRINGE | Freq: Once | INTRAMUSCULAR | Status: AC
  Administered 2022-02-10: 0.5 mL via INTRAMUSCULAR
  Filled 2022-02-10: qty 0.5

## 2022-02-10 NOTE — Discharge Instructions (Addendum)
Overall recommend bacitracin and Neosporin ointment twice a day to your wound with soap and water as well.  I have prescribed you an oral antibiotic in case there is an infection as well.  There is no fracture on your x-ray.  Recommend wound check with primary care doctor next week.  Take your next dose antibiotic tomorrow morning.

## 2022-02-10 NOTE — ED Provider Notes (Signed)
[facility] Provider Note   CSN: [id number] Arrival date & time: 02/10/22  1430     History  Chief Complaint  Patient presents with   Anna [name] is a 88 y.o. female.  Patient here with concern for wound on her left shin.  Patient fell about a week ago scraped her left shin.  She is not on blood thinners.  Denies any head injury or neck injury.  She has been ambulatory since the fall.  She has been using soap and water and Vaseline on her wound.  She is concerned for may be infection.  Nothing has made it worse or better.  The history is provided by the patient.       Home Medications Prior to Admission medications   Medication Sig Start Date End Date Taking? Authorizing Provider  doxycycline (VIBRAMYCIN) 100 MG capsule Take 1 capsule (100 mg total) by mouth 2 (two) times daily for 10 days. 02/10/22 02/20/22 Yes Sydell Prowell, DO  alendronate (FOSAMAX) 70 MG tablet Take 1 tablet (70 mg total) by mouth every 7 (seven) days. Take with a full glass of water on an empty stomach. 10/09/21   [name], MD  ascorbic acid (VITAMIN C) 500 MG tablet Take by mouth.    [provider]  b complex vitamins capsule Take 1 capsule by mouth daily.    [provider]  cetirizine (ZYRTEC) 10 MG tablet Take by mouth.    [provider]  Clotrimazole 1 % OINT Apply a small amount of cream to rash twice a day until resolved. 03/03/21   [name], MD  estradiol (ESTRACE) 0.1 MG/GM vaginal cream Place 1 Applicatorful vaginally at bedtime for 14 days, THEN 1 Applicatorful 2 (two) times a week. 11/24/21   [name], MD  fluticasone (FLONASE) 50 MCG/ACT nasal spray Place 1 spray into both nostrils at bedtime.    [provider]  ipratropium (ATROVENT) 0.03 % nasal spray Place 2 sprays into both nostrils every 12 (twelve) hours. 10/09/21   [name], MD  Lifitegrast Shirley Friar) 5 % SOLN Xiidra 5 % eye drops in a  dropperette    [provider]  Lutein 10 MG TABS Take by mouth. Unsure of dose.Takes once a day    [provider]  Minoxidil (ROGAINE MENS) 5 % FOAM See admin instructions. 11/10/20   [provider]  Multiple Vitamins-Minerals (PRESERVISION AREDS 2 PO) Take by mouth.    [provider]  Omega-3 1000 MG CAPS Take by mouth.    [provider]      Allergies    Demerol [meperidine] and Sulfa antibiotics    Review of Systems   Review of Systems  Physical Exam Updated Vital Signs BP (!) 157/63 (BP Location: Left Arm)   Pulse (!) 105   Temp 98.5 F (36.9 C) (Oral)   Resp 18   Ht '4\' 11"'$  (1.499 m)   Wt 59 kg   SpO2 97%   BMI 26.26 kg/m  Physical Exam Vitals and nursing note reviewed.  Constitutional:      General: She is not in acute distress.    Appearance: She is well-developed. She is not ill-appearing.  HENT:     Head: Normocephalic and atraumatic.  Eyes:     Extraocular Movements: Extraocular movements intact.     Conjunctiva/sclera: Conjunctivae normal.     Pupils: Pupils are equal, round, and reactive to light.  Cardiovascular:     Rate and Rhythm: Normal rate and regular rhythm.     Pulses: Normal pulses.     Heart sounds: Normal heart sounds. No murmur heard. Pulmonary:     Effort: Pulmonary effort is normal. No respiratory distress.     Breath sounds: Normal breath sounds.  Abdominal:     Palpations: Abdomen is soft.     Tenderness: There is no abdominal tenderness.  Musculoskeletal:        General: No swelling.     Cervical back: Normal range of motion and neck supple.  Skin:    General: Skin is warm and dry.     Capillary Refill: Capillary refill takes less than 2 seconds.     Findings: Erythema present.     Comments: Skin tear to left shin with surrounding bruising and erythema but no purulent drainage  Neurological:     General: No focal deficit present.     Mental Status: She is alert.  Psychiatric:         Mood and Affect: Mood normal.     ED Results / Procedures / Treatments   Labs (all labs ordered are listed, but only abnormal results are displayed) Labs Reviewed - No data to display  EKG None  Radiology DG Tibia/Fibula Left  Result Date: 02/10/2022 CLINICAL DATA:  Acute LEFT LOWER leg pain following fall. Initial encounter. EXAM: LEFT TIBIA AND FIBULA - 2 VIEW COMPARISON:  04/30/2019 FINDINGS: No acute fracture, subluxation or dislocation identified. Remote MEDIAL malleolar fracture again noted. No suspicious focal bony lesions are present. IMPRESSION: No acute abnormality. Electronically Signed   By: Margarette Canada M.D.   On: 02/10/2022 15:18    Procedures Procedures    Medications Ordered in ED Medications  bacitracin ointment ( Topical Given 02/10/22 1502)  doxycycline (VIBRA-TABS) tablet 100 mg (100 mg Oral Given 02/10/22 1502)  Tdap (BOOSTRIX) injection 0.5 mL (0.5 mLs Intramuscular Given 02/10/22 1502)    ED Course/ Medical Decision Making/ A&P                           Medical Decision Making Amount and/or Complexity of Data Reviewed Radiology: ordered.  Risk OTC drugs. Prescription drug management.   Anna [name] is here with wound to her left shin after a fall 6 days ago.  She is concerned for infection.  No major medical problems.  Unremarkable vitals.  No fever.  Patient has skin tear to the left shin.  May be a mild cellulitis.  But overall suspect that this is mostly skin tear with bruising and resolving hematoma.  X-ray was ordered and per my review and interpretation there is no fracture.  Recommend bacitracin or Neosporin ointment twice a day with soap and water.  Wound care instructions given.  Will prescribe oral doxycycline in case there is a cellulitic process.  There is no purulent drainage.  She understands return precautions.  Discharged in good condition.  Neurovascular neuromuscular intact otherwise.  This chart was dictated using voice recognition  software.  Despite best efforts to proofread,  errors can occur which can change the documentation meaning.         Final Clinical Impression(s) / ED Diagnoses Final diagnoses:  Abrasion    Rx / DC Orders ED Discharge Orders          Ordered    doxycycline (VIBRAMYCIN) 100 MG capsule  2 times daily  02/10/22 Jansen, [place], DO 02/10/22 1536

## 2022-02-10 NOTE — ED Triage Notes (Signed)
Pt arrives pov, slow gait with cane, c/o fall x 6 days pta, Swelling and redness, skin tear noted to distal LLE. Denies fever

## 2022-02-14 ENCOUNTER — Ambulatory Visit (INDEPENDENT_AMBULATORY_CARE_PROVIDER_SITE_OTHER): Payer: Medicare Other | Admitting: Family Medicine

## 2022-02-14 VITALS — BP 132/64 | HR 104 | Temp 97.0°F | Ht 59.0 in | Wt 137.0 lb

## 2022-02-14 DIAGNOSIS — L089 Local infection of the skin and subcutaneous tissue, unspecified: Secondary | ICD-10-CM | POA: Diagnosis not present

## 2022-02-14 DIAGNOSIS — J301 Allergic rhinitis due to pollen: Secondary | ICD-10-CM | POA: Diagnosis not present

## 2022-02-14 DIAGNOSIS — S80812D Abrasion, left lower leg, subsequent encounter: Secondary | ICD-10-CM

## 2022-02-14 DIAGNOSIS — L821 Other seborrheic keratosis: Secondary | ICD-10-CM | POA: Insufficient documentation

## 2022-02-14 NOTE — Progress Notes (Signed)
[facility] [address] [place] [address] Dept: [phone] Dept Fax: [phone]  Office Visit  Subjective:    Patient ID: Anna [name], female    DOB: 1934-05-11, 88 y.o..   MRN: [id number]  Chief Complaint  Patient presents with   Follow-up    F/u leg wound from hospital 02/10/22.      History of Present Illness:  Patient is in today for evaluation of a left leg wound. She notes that she tripped against a stair tred at her home around 6/10. She abraded the skin on her let shin. She did home care of this. After a week, she was worried that it might be infected. She went to the [facility] ED on 6/17. She had an x-ray performed. They then dressed the wound with Bacitracin and a bandage. She was placed on a 10-day course of doxycycline for this. She notes it has continued ot be a bit painful. Her daughter, Jenny Reichmann, notes that this did look more swollen and red last night.  Ms. Orea has a history of allergic rhinitis and possibly background chronic non-allergic rhinitis. She has continued to have significant runny nose, despite the use of cetirizine, fluticasone nasal spray , and ipratropium nasal spray. She has had some mild cough with phlegm.  Past Medical History: Patient Active Problem List   Diagnosis Date Noted   Seborrheic keratoses 02/14/2022   History of basal cell carcinoma (BCC) 10/09/2021   Osteoarthritis of multiple joints 01/05/2021   Nonintractable epilepsy with complex partial seizures ([place]) 12/07/2019   Chronic bilateral low back pain without sciatica 12/07/2019   Idiopathic stabbing headache 11/02/2019   Sensorineural hearing loss (SNHL) 08/07/2018   Idiopathic peripheral neuropathy 01/25/2016   Nephrolithiasis 03/29/2015   Postoperative anemia due to acute blood loss 01/06/2015   History of total knee arthroplasty 01/05/2015   Rosacea 07/14/2014   Degenerative joint disease of cervical  and lumbar spine 07/14/2014   Microscopic hematuria 07/14/2014   Mammogram abnormal 07/14/2014   History of breast cancer in female 07/14/2014   Chest pain 07/14/2014   Atrophic vaginitis 07/14/2014   Allergic rhinitis 07/14/2014   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   Vitamin D deficiency 05/18/2014   Osteoporosis 05/18/2014   Mixed hyperlipidemia 05/18/2014   Diverticulosis of large intestine without hemorrhage 05/18/2014   Past Surgical History:  Procedure Laterality Date   ABDOMINAL HYSTERECTOMY     APPENDECTOMY     BACK SURGERY     disc repaired 20 years ago    BREAST SURGERY     double mastectomy    CESAREAN SECTION     times 2   colonscopy      cyst repaired      following mammogram / cyst ruptured had to surgically repair area / left side    DILATION AND CURETTAGE OF UTERUS     EYE SURGERY     bilat cataract surgery    HAND SURGERY     left hand / joint issues with thumb    LUNG SURGERY     related to fatty tumor / left approx 12 years ago    right knee arthroscopy      TOTAL KNEE ARTHROPLASTY Right 01/05/2015   Procedure: RIGHT TOTAL KNEE ARTHROPLASTY;  Surgeon: Latanya Maudlin, MD;  Location: [facility];  Service: Orthopedics;  Laterality: Right;   No family history on file.  Outpatient Medications Prior to Visit  Medication Sig Dispense Refill   alendronate (FOSAMAX) 70  MG tablet Take 1 tablet (70 mg total) by mouth every 7 (seven) days. Take with a full glass of water on an empty stomach. 4 tablet 11   ascorbic acid (VITAMIN C) 500 MG tablet Take by mouth.     b complex vitamins capsule Take 1 capsule by mouth daily.     cetirizine (ZYRTEC) 10 MG tablet Take by mouth.     doxycycline (VIBRAMYCIN) 100 MG capsule Take 1 capsule (100 mg total) by mouth 2 (two) times daily for 10 days. 20 capsule 0   estradiol (ESTRACE) 0.1 MG/GM vaginal cream Place 1 Applicatorful vaginally at bedtime for 14 days, THEN 1 Applicatorful 2 (two) times a week. 42.5 g 12   fluticasone (FLONASE) 50  MCG/ACT nasal spray Place 1 spray into both nostrils at bedtime.     ipratropium (ATROVENT) 0.03 % nasal spray Place 2 sprays into both nostrils every 12 (twelve) hours. 30 mL 12   Lifitegrast (XIIDRA) 5 % SOLN Xiidra 5 % eye drops in a dropperette     Lutein 10 MG TABS Take by mouth. Unsure of dose.Takes once a day     Multiple Vitamins-Minerals (PRESERVISION AREDS 2 PO) Take by mouth.     Omega-3 1000 MG CAPS Take by mouth.     Clotrimazole 1 % OINT Apply a small amount of cream to rash twice a day until resolved. 56.7 g 0   Minoxidil (ROGAINE MENS) 5 % FOAM See admin instructions.     No facility-administered medications prior to visit.   Allergies  Allergen Reactions   Demerol [Meperidine] Nausea And Vomiting   Sulfa Antibiotics Nausea And Vomiting     Objective:   Today's Vitals   02/14/22 1340  BP: 132/64  Pulse: (!) 104  Temp: (!) 97 F (36.1 C)  TempSrc: Temporal  SpO2: 97%  Weight: 137 lb (62.1 kg)  Height: '4\' 11"'$  (1.499 m)   Body mass index is 27.67 kg/m.   General: Well developed, well nourished. No acute distress. Lungs: Clear to auscultation bilaterally. No wheezing, rales or rhonchi. Skin: Warm and dry. There is a long, triangular-shaped wound tot he left shin. This has some   surrounding bruising. There is mild redness. Compared to photos the patient has, the redness has   receeded. There is no drainage noted. The wound base appears to be health granulation tissue. Psych: Alert and oriented. Normal mood and affect.  Health Maintenance Due  Topic Date Due   Zoster Vaccines- Shingrix (1 of 2) Never done   Pneumonia Vaccine 31+ Years old (1 - PCV) Never done   DEXA SCAN  Never done   COVID-19 Vaccine (3 - Pfizer risk series) 12/08/2019   Imaging: Left tibia/fibula x-ray (02/10/2022) IMPRESSION: No acute abnormality.  Assessment & Plan:   1. Abrasion, leg w/ infection, left, subsequent encounter I cleaned the wound, applied a Telfa pad and wrapped with  C[place]. She should continue routine wound care. I recommended against further Bacitracin use, as it can inhibit wound healing. She can complete the doxycycline, but I'm not sure this was necessary to begin with. I will see her back in 2 weeks to reassess.  2. Seasonal allergic rhinitis due to pollen Nasal symptoms are still consistent with seasonal vs. non-allergic rhinitis. I recommend she add nasal saline washes to see if this will help better manage her symptoms.   Return in about 2 weeks (around 02/28/2022) for Reassessment.   [name], MD

## 2022-02-21 ENCOUNTER — Encounter: Payer: Self-pay | Admitting: Family Medicine

## 2022-03-07 ENCOUNTER — Ambulatory Visit: Payer: Medicare Other | Admitting: Family Medicine

## 2022-03-20 ENCOUNTER — Emergency Department (HOSPITAL_BASED_OUTPATIENT_CLINIC_OR_DEPARTMENT_OTHER): Payer: Medicare Other

## 2022-03-20 ENCOUNTER — Encounter (HOSPITAL_BASED_OUTPATIENT_CLINIC_OR_DEPARTMENT_OTHER): Payer: Self-pay | Admitting: Emergency Medicine

## 2022-03-20 ENCOUNTER — Observation Stay (HOSPITAL_BASED_OUTPATIENT_CLINIC_OR_DEPARTMENT_OTHER)
Admission: EM | Admit: 2022-03-20 | Discharge: 2022-03-21 | Disposition: A | Discharge status: Home / Self Care | Payer: Medicare Other | Attending: Internal Medicine | Admitting: Internal Medicine

## 2022-03-20 ENCOUNTER — Other Ambulatory Visit: Payer: Self-pay

## 2022-03-20 DIAGNOSIS — Z853 Personal history of malignant neoplasm of breast: Secondary | ICD-10-CM | POA: Diagnosis not present

## 2022-03-20 DIAGNOSIS — N189 Chronic kidney disease, unspecified: Secondary | ICD-10-CM | POA: Diagnosis not present

## 2022-03-20 DIAGNOSIS — G40209 Localization-related (focal) (partial) symptomatic epilepsy and epileptic syndromes with complex partial seizures, not intractable, without status epilepticus: Secondary | ICD-10-CM | POA: Diagnosis present

## 2022-03-20 DIAGNOSIS — R079 Chest pain, unspecified: Secondary | ICD-10-CM | POA: Insufficient documentation

## 2022-03-20 DIAGNOSIS — Z96651 Presence of right artificial knee joint: Secondary | ICD-10-CM | POA: Insufficient documentation

## 2022-03-20 DIAGNOSIS — J45909 Unspecified asthma, uncomplicated: Secondary | ICD-10-CM | POA: Insufficient documentation

## 2022-03-20 DIAGNOSIS — R030 Elevated blood-pressure reading, without diagnosis of hypertension: Secondary | ICD-10-CM

## 2022-03-20 DIAGNOSIS — Z79899 Other long term (current) drug therapy: Secondary | ICD-10-CM | POA: Insufficient documentation

## 2022-03-20 DIAGNOSIS — J189 Pneumonia, unspecified organism: Secondary | ICD-10-CM

## 2022-03-20 DIAGNOSIS — M546 Pain in thoracic spine: Principal | ICD-10-CM

## 2022-03-20 DIAGNOSIS — E782 Mixed hyperlipidemia: Secondary | ICD-10-CM | POA: Diagnosis present

## 2022-03-20 DIAGNOSIS — G8929 Other chronic pain: Secondary | ICD-10-CM | POA: Diagnosis present

## 2022-03-20 DIAGNOSIS — Z20822 Contact with and (suspected) exposure to covid-19: Secondary | ICD-10-CM | POA: Diagnosis not present

## 2022-03-20 DIAGNOSIS — R778 Other specified abnormalities of plasma proteins: Secondary | ICD-10-CM

## 2022-03-20 LAB — CBC WITH DIFFERENTIAL/PLATELET
Abs Immature Granulocytes: 0.01 10*3/uL (ref 0.00–0.07)
Basophils Absolute: 0.1 10*3/uL (ref 0.0–0.1)
Basophils Relative: 1 %
Eosinophils Absolute: 0.2 10*3/uL (ref 0.0–0.5)
Eosinophils Relative: 3 %
HCT: 41.4 % (ref 36.0–46.0)
Hemoglobin: 13.8 g/dL (ref 12.0–15.0)
Immature Granulocytes: 0 %
Lymphocytes Relative: 42 %
Lymphs Abs: 2.5 10*3/uL (ref 0.7–4.0)
MCH: 30.3 pg (ref 26.0–34.0)
MCHC: 33.3 g/dL (ref 30.0–36.0)
MCV: 90.8 fL (ref 80.0–100.0)
Monocytes Absolute: 0.4 10*3/uL (ref 0.1–1.0)
Monocytes Relative: 6 %
Neutro Abs: 2.9 10*3/uL (ref 1.7–7.7)
Neutrophils Relative %: 48 %
Platelets: 233 10*3/uL (ref 150–400)
RBC: 4.56 MIL/uL (ref 3.87–5.11)
RDW: 12.9 % (ref 11.5–15.5)
WBC: 6 10*3/uL (ref 4.0–10.5)
nRBC: 0 % (ref 0.0–0.2)

## 2022-03-20 LAB — BASIC METABOLIC PANEL
Anion gap: 7 (ref 5–15)
BUN: 12 mg/dL (ref 8–23)
CO2: 25 mmol/L (ref 22–32)
Calcium: 9.1 mg/dL (ref 8.9–10.3)
Chloride: 109 mmol/L (ref 98–111)
Creatinine, Ser: 0.65 mg/dL (ref 0.44–1.00)
GFR, Estimated: >60 mL/min (ref >60)
Glucose, Bld: 102 mg/dL — ABNORMAL HIGH (ref 70–99)
Potassium: 3.6 mmol/L (ref 3.5–5.1)
Sodium: 141 mmol/L (ref 135–145)

## 2022-03-20 LAB — RESPIRATORY PANEL BY PCR

## 2022-03-20 LAB — PROCALCITONIN: Procalcitonin: <0.1 ng/mL

## 2022-03-20 LAB — TROPONIN I (HIGH SENSITIVITY)
Troponin I (High Sensitivity): 21 ng/L — ABNORMAL HIGH (ref <18)
Troponin I (High Sensitivity): 17 ng/L (ref <18)

## 2022-03-20 LAB — SARS CORONAVIRUS 2 BY RT PCR: SARS Coronavirus 2 by RT PCR: NEGATIVE

## 2022-03-20 MED ORDER — SODIUM CHLORIDE 0.9% FLUSH
3.0000 mL | Freq: Two times a day (BID) | INTRAVENOUS | Status: DC
Start: 2022-03-20 — End: 2022-03-21
  Administered 2022-03-20 – 2022-03-21 (×2): 3 mL via INTRAVENOUS

## 2022-03-20 MED ORDER — ENOXAPARIN SODIUM 40 MG/0.4ML IJ SOSY
40.0000 mg | PREFILLED_SYRINGE | INTRAMUSCULAR | Status: DC
Start: 2022-03-20 — End: 2022-03-21
  Filled 2022-03-20: qty 0.4

## 2022-03-20 MED ORDER — ACETAMINOPHEN 325 MG PO TABS: 650.0000 mg | ORAL_TABLET | Freq: Four times a day (QID) | ORAL | Status: DC | PRN

## 2022-03-20 MED ORDER — LATANOPROST 0.005 % OP SOLN
1.0000 [drp] | Freq: Every evening | OPHTHALMIC | Status: DC
Start: 2022-03-20 — End: 2022-03-21
  Administered 2022-03-20: 1 [drp] via OPHTHALMIC
  Filled 2022-03-20: qty 2.5

## 2022-03-20 MED ORDER — KETOROLAC TROMETHAMINE 30 MG/ML IJ SOLN
30.0000 mg | Freq: Once | INTRAMUSCULAR | Status: AC
  Administered 2022-03-20: 30 mg via INTRAVENOUS
  Filled 2022-03-20: qty 1

## 2022-03-20 MED ORDER — HYDROCODONE-ACETAMINOPHEN 5-325 MG PO TABS
1.0000 | ORAL_TABLET | ORAL | Status: DC | PRN
  Administered 2022-03-20 – 2022-03-21 (×2): 1 via ORAL
  Filled 2022-03-20 (×2): qty 1

## 2022-03-20 MED ORDER — LIFITEGRAST 5 % OP SOLN: 1.0000 [drp] | Freq: Two times a day (BID) | OPHTHALMIC | Status: DC

## 2022-03-20 MED ORDER — SODIUM CHLORIDE 0.9% FLUSH: 3.0000 mL | INTRAVENOUS | Status: DC | PRN

## 2022-03-20 MED ORDER — SODIUM CHLORIDE 0.9 % IV SOLN: 250.0000 mL | INTRAVENOUS | Status: DC | PRN

## 2022-03-20 MED ORDER — IOHEXOL 350 MG/ML SOLN
75.0000 mL | Freq: Once | INTRAVENOUS | Status: AC | PRN
  Administered 2022-03-20: 75 mL via INTRAVENOUS

## 2022-03-20 MED ORDER — ACETAMINOPHEN 650 MG RE SUPP: 650.0000 mg | Freq: Four times a day (QID) | RECTAL | Status: DC | PRN

## 2022-03-20 MED ORDER — FLUTICASONE PROPIONATE 50 MCG/ACT NA SUSP
1.0000 | Freq: Every day | NASAL | Status: DC
  Administered 2022-03-20: 1 via NASAL
  Filled 2022-03-20: qty 16

## 2022-03-20 MED ORDER — DICLOFENAC SODIUM 1 % EX GEL
2.0000 g | Freq: Four times a day (QID) | CUTANEOUS | Status: DC
  Administered 2022-03-20 – 2022-03-21 (×2): 2 g via TOPICAL
  Filled 2022-03-20: qty 100

## 2022-03-20 MED ORDER — LIDOCAINE 5 % EX PTCH: 1.0000 | MEDICATED_PATCH | CUTANEOUS | Status: DC

## 2022-03-20 MED ORDER — PROSIGHT PO TABS
1.0000 | ORAL_TABLET | Freq: Every day | ORAL | Status: DC
  Administered 2022-03-21: 1 via ORAL
  Filled 2022-03-20: qty 1

## 2022-03-20 MED ORDER — METHOCARBAMOL 500 MG PO TABS
500.0000 mg | ORAL_TABLET | Freq: Three times a day (TID) | ORAL | Status: DC | PRN
Start: 2022-03-20 — End: 2022-03-21

## 2022-03-20 MED ORDER — SODIUM CHLORIDE 0.9 % IV SOLN
1.0000 g | Freq: Once | INTRAVENOUS | Status: AC
  Administered 2022-03-20: 1 g via INTRAVENOUS
  Filled 2022-03-20: qty 10

## 2022-03-20 MED ORDER — LUTEIN 10 MG PO TABS: 1.0000 | ORAL_TABLET | Freq: Every day | ORAL | Status: DC

## 2022-03-20 MED ORDER — SODIUM CHLORIDE 0.9 % IV SOLN
1.0000 g | INTRAVENOUS | Status: DC
  Administered 2022-03-21: 1 g via INTRAVENOUS
  Filled 2022-03-20: qty 10

## 2022-03-20 MED ORDER — SODIUM CHLORIDE 0.9 % IV SOLN
500.0000 mg | INTRAVENOUS | Status: DC
  Filled 2022-03-20: qty 5

## 2022-03-20 MED ORDER — LORATADINE 10 MG PO TABS
10.0000 mg | ORAL_TABLET | Freq: Every day | ORAL | Status: DC
  Administered 2022-03-21: 10 mg via ORAL
  Filled 2022-03-20: qty 1

## 2022-03-20 MED ORDER — SODIUM CHLORIDE 0.9 % IV SOLN
500.0000 mg | Freq: Once | INTRAVENOUS | Status: AC
  Administered 2022-03-20: 500 mg via INTRAVENOUS
  Filled 2022-03-20: qty 5

## 2022-03-20 MED ORDER — ACETAMINOPHEN 500 MG PO TABS
1000.0000 mg | ORAL_TABLET | Freq: Once | ORAL | Status: AC
  Administered 2022-03-20: 1000 mg via ORAL
  Filled 2022-03-20: qty 2

## 2022-03-20 NOTE — ED Notes (Signed)
Phone Handoff Report given to Angelia-RN with [employer]

## 2022-03-20 NOTE — Assessment & Plan Note (Signed)
88 year old female presenting with 3 day history of worsening upper thoracic back pain that is reproducible on exam. CT chest with gas trapping, difficult to exclude viral/atypical infection.  -obs to telemetry -covid negative. Check RVP and PCT -patient with no symptoms for URI. She denies fever, cough, shortness of breath, congestion.  -will continue rocephin, azithromycin until studies completed. Blood cx also ordered in ED.  -pain reproducible on exam, more consistent with MSK. Will try voltaren gel QID, heating pad, tylenol and robaxin prn. norco for severe pain if needed. No radicular symptom to warrant MRI at this time, but consider if no improvement.

## 2022-03-20 NOTE — ED Notes (Signed)
Phone Handoff Report given to rec RN at [facility]

## 2022-03-20 NOTE — ED Provider Notes (Signed)
[facility] Provider Note   CSN: [id number] Arrival date & time: 03/20/22  0330     History  Chief Complaint  Patient presents with   Back Pain    Anna [name] is a 88 y.o. female.  The history is provided by the patient.  Back Pain Pain location: left upper back. Quality:  Stabbing and shooting (dull) Radiates to: through to left chest. Pain severity:  Severe Pain is:  Same all the time Onset quality:  Sudden Duration:  1 hour Timing:  Constant Progression:  Unchanged Chronicity:  New Context: not MVA and not recent illness   Relieved by:  Nothing Worsened by:  Nothing Ineffective treatments:  Bed rest Associated symptoms: chest pain   Associated symptoms: no fever, no leg pain, no numbness and no paresthesias   Risk factors: not pregnant   Patient with back pain to the chest.  No n/v/d.  No cough or congestion.  Was recently at a church meeting.       Home Medications Prior to Admission medications   Medication Sig Start Date End Date Taking? Authorizing Provider  alendronate (FOSAMAX) 70 MG tablet Take 1 tablet (70 mg total) by mouth every 7 (seven) days. Take with a full glass of water on an empty stomach. 10/09/21   [name], MD  ascorbic acid (VITAMIN C) 500 MG tablet Take by mouth.    [provider]  b complex vitamins capsule Take 1 capsule by mouth daily.    [provider]  cetirizine (ZYRTEC) 10 MG tablet Take by mouth.    [provider]  estradiol (ESTRACE) 0.1 MG/GM vaginal cream Place 1 Applicatorful vaginally at bedtime for 14 days, THEN 1 Applicatorful 2 (two) times a week. 11/24/21   [name], MD  fluticasone (FLONASE) 50 MCG/ACT nasal spray Place 1 spray into both nostrils at bedtime.    [provider]  ipratropium (ATROVENT) 0.03 % nasal spray Place 2 sprays into both nostrils every 12 (twelve) hours. 10/09/21   [name], MD  Lifitegrast Shirley Friar) 5 % SOLN  Xiidra 5 % eye drops in a dropperette    [provider]  Lutein 10 MG TABS Take by mouth. Unsure of dose.Takes once a day    [provider]  Multiple Vitamins-Minerals (PRESERVISION AREDS 2 PO) Take by mouth.    [provider]  Omega-3 1000 MG CAPS Take by mouth.    [provider]      Allergies    Demerol [meperidine] and Sulfa antibiotics    Review of Systems   Review of Systems  Constitutional:  Negative for fever.  HENT:  Negative for congestion.   Eyes:  Negative for redness.  Respiratory:  Negative for cough.   Cardiovascular:  Positive for chest pain. Negative for palpitations and leg swelling.  Musculoskeletal:  Positive for back pain.  Neurological:  Negative for numbness and paresthesias.  All other systems reviewed and are negative.   Physical Exam Updated Vital Signs BP (!) 180/82   Pulse 95   Temp 97.7 F (36.5 C) (Oral)   Resp 19   Ht '4\' 11"'$  (1.499 m)   Wt 61.2 kg   SpO2 99%   BMI 27.27 kg/m  Physical Exam Vitals and nursing note reviewed. Exam conducted with a chaperone present.  Constitutional:      General: She is not in acute distress.    Appearance: Normal appearance. She is well-developed.  HENT:  Head: Normocephalic and atraumatic.     Nose: Nose normal.  Eyes:     Pupils: Pupils are equal, round, and reactive to light.  Cardiovascular:     Rate and Rhythm: Normal rate and regular rhythm.     Pulses: Normal pulses.     Heart sounds: Normal heart sounds.  Pulmonary:     Effort: Pulmonary effort is normal. No respiratory distress.     Breath sounds: No stridor. No wheezing or rhonchi.  Abdominal:     General: Bowel sounds are normal. There is no distension.     Palpations: Abdomen is soft.     Tenderness: There is no abdominal tenderness. There is no guarding or rebound.  Genitourinary:    Vagina: No vaginal discharge.  Musculoskeletal:        General: Normal range of motion.     Cervical back:  Neck supple.  Skin:    General: Skin is warm.     Findings: No erythema or rash.  Neurological:     General: No focal deficit present.     Mental Status: She is alert and oriented to person, place, and time.     ED Results / Procedures / Treatments   Labs (all labs ordered are listed, but only abnormal results are displayed) Results for orders placed or performed during the hospital encounter of 03/20/22  CBC with Differential  Result Value Ref Range   WBC 6.0 4.0 - 10.5 K/uL   RBC 4.56 3.87 - 5.11 MIL/uL   Hemoglobin 13.8 12.0 - 15.0 g/dL   HCT 41.4 36.0 - 46.0 %   MCV 90.8 80.0 - 100.0 fL   MCH 30.3 26.0 - 34.0 pg   MCHC 33.3 30.0 - 36.0 g/dL   RDW 12.9 11.5 - 15.5 %   Platelets 233 150 - 400 K/uL   nRBC 0.0 0.0 - 0.2 %   Neutrophils Relative % 48 %   Neutro Abs 2.9 1.7 - 7.7 K/uL   Lymphocytes Relative 42 %   Lymphs Abs 2.5 0.7 - 4.0 K/uL   Monocytes Relative 6 %   Monocytes Absolute 0.4 0.1 - 1.0 K/uL   Eosinophils Relative 3 %   Eosinophils Absolute 0.2 0.0 - 0.5 K/uL   Basophils Relative 1 %   Basophils Absolute 0.1 0.0 - 0.1 K/uL   Immature Granulocytes 0 %   Abs Immature Granulocytes 0.01 0.00 - 0.07 K/uL  Basic metabolic panel  Result Value Ref Range   Sodium 141 135 - 145 mmol/L   Potassium 3.6 3.5 - 5.1 mmol/L   Chloride 109 98 - 111 mmol/L   CO2 25 22 - 32 mmol/L   Glucose, Bld 102 (H) 70 - 99 mg/dL   BUN 12 8 - 23 mg/dL   Creatinine, Ser 0.65 0.44 - 1.00 mg/dL   Calcium 9.1 8.9 - 10.3 mg/dL   GFR, Estimated >60 >60 mL/min   Anion gap 7 5 - 15  Troponin I (High Sensitivity)  Result Value Ref Range   Troponin I (High Sensitivity) 21 (H) <18 ng/L   CT Angio Chest PE W and/or Wo Contrast  Result Date: 03/20/2022 CLINICAL DATA:  88 year old female with left upper back pain, left chest pain. Prior breast cancer and mastectomy. EXAM: CT ANGIOGRAPHY CHEST WITH CONTRAST TECHNIQUE: Multidetector CT imaging of the chest was performed using the standard  protocol during bolus administration of intravenous contrast. Multiplanar CT image reconstructions and MIPs were obtained to evaluate the vascular anatomy. RADIATION DOSE REDUCTION:  This exam was performed according to the departmental dose-optimization program which includes automated exposure control, adjustment of the mA and/or kV according to patient size and/or use of iterative reconstruction technique. CONTRAST:  49m OMNIPAQUE IOHEXOL 350 MG/ML SOLN COMPARISON:  Portable chest 0342 hours today. Chest CT report 03/15/2015 (no images available). FINDINGS: Cardiovascular: Good contrast bolus timing in the pulmonary arterial tree. Minor respiratory motion. No focal filling defect identified in the pulmonary arteries to suggest acute pulmonary embolism. Calcified aortic atherosclerosis. Negative for thoracic aortic aneurysm or dissection. Mild cardiomegaly. No pericardial effusion. Calcified coronary artery atherosclerosis on series 7, image 183. Mediastinum/Nodes: Negative. No mediastinal mass or lymphadenopathy. Lungs/Pleura: Major airways remain patent although with atelectatic changes bilaterally. Atelectatic changes in both lungs with mosaic attenuation probably due to combined gas trapping and atelectasis. 9 mm right upper lobe lung nodule on series 6, image 20, stable by report since the 2016 CT, and stable at that time since 2006. No pleural effusion. No consolidation. Upper Abdomen: Negative visible liver, right adrenal gland, and spleen. Small to moderate gastric hiatal hernia. Musculoskeletal: Intermittent spinal ankylosis (especially T6-T7). No acute osseous abnormality identified. Review of the MIP images confirms the above findings. IMPRESSION: 1. Negative for acute pulmonary embolus. 2. Pulmonary atelectasis and gas trapping, such that it is difficult to exclude viral/atypical respiratory infection. But no consolidation or pleural effusion. 3. Calcified coronary artery and Aortic Atherosclerosis  (ICD10-I70.0). 4. Small to moderate gastric hiatal hernia. Electronically Signed   By: HGenevie AnnM.D.   On: 03/20/2022 05:59   DG Chest Portable 1 View  Result Date: 03/20/2022 CLINICAL DATA:  Left-sided upper back pain. EXAM: PORTABLE CHEST 1 VIEW COMPARISON:  Dec 28, 2014 FINDINGS: The heart size and mediastinal contours are within normal limits. There is mild calcification of the aortic arch. Both lungs are clear. Multilevel degenerative changes seen throughout the thoracic spine. IMPRESSION: No active cardiopulmonary disease. Electronically Signed   By: TVirgina NorfolkM.D.   On: 03/20/2022 03:54     EKG EKG Interpretation  Date/Time:  Tuesday March 20 2022 03:40:36 EDT Ventricular Rate:  93 PR Interval:  160 QRS Duration: 122 QT Interval:  397 QTC Calculation: 494 R Axis:   -15 Text Interpretation: Sinus rhythm IVCD, consider atypical LBBB Confirmed by PRandal Buba Mishael Haran ([id number]) on 03/20/2022 3:42:51 AM  Radiology DG Chest Portable 1 View  Result Date: 03/20/2022 CLINICAL DATA:  Left-sided upper back pain. EXAM: PORTABLE CHEST 1 VIEW COMPARISON:  Dec 28, 2014 FINDINGS: The heart size and mediastinal contours are within normal limits. There is mild calcification of the aortic arch. Both lungs are clear. Multilevel degenerative changes seen throughout the thoracic spine. IMPRESSION: No active cardiopulmonary disease. Electronically Signed   By: TVirgina NorfolkM.D.   On: 03/20/2022 03:54    Procedures Procedures    Medications Ordered in ED Medications  cefTRIAXone (ROCEPHIN) 1 g in sodium chloride 0.9 % 100 mL IVPB (has no administration in time range)  azithromycin (ZITHROMAX) 500 mg in sodium chloride 0.9 % 250 mL IVPB (has no administration in time range)  ketorolac (TORADOL) 30 MG/ML injection 30 mg (has no administration in time range)  acetaminophen (TYLENOL) tablet 1,000 mg (1,000 mg Oral Given 03/20/22 0552)  iohexol (OMNIPAQUE) 350 MG/ML injection 75 mL (75 mLs Intravenous  Contrast Given 03/20/22 0529)    ED Course/ Medical Decision Making/ A&P  Medical Decision Making Patient with back pain through to the left chest   Problems Addressed: Chest pain, unspecified type:    Details: admit for repeat tropons  Amount and/or Complexity of Data Reviewed Independent Historian:     Details: family member see above External Data Reviewed: notes.    Details: previous ED notes reviewed Labs: ordered.    Details: all labs reviewed: white count normal 6, normal hemoglobin 13.8.  Normal sodium 141 and potassium 3.6 and normal creatinine,  troponin elevated 21 c Radiology: ordered and independent interpretation performed.    Details: negative CXR by me.  PNA by me on CTA ECG/medicine tests: ordered and independent interpretation performed. Decision-making details documented in ED Course.  Risk OTC drugs. Prescription drug management. Decision regarding hospitalization.   g why or why not admission, treatments were needed:1} Final Clinical Impression(s) / ED Diagnoses Final diagnoses:  None   The patient appears reasonably stabilized for admission considering the current resources, flow, and capabilities available in the ED at this time, and I doubt any other [facility] requiring further screening and/or treatment in the ED prior to admission.  Rx / DC Orders ED Discharge Orders     None         Majour Frei, MD 03/20/22 [phone]

## 2022-03-20 NOTE — Assessment & Plan Note (Signed)
No history of hypertension, but has had intermittently elevated pressure. Could be due to pain Trend and follow.

## 2022-03-20 NOTE — ED Triage Notes (Signed)
Pt c/o left upper back pain radiating to left upper chest. Described as strong and dull occasionally becoming sharp. Denies n/v, fever and cough.

## 2022-03-20 NOTE — H&P (Signed)
History and Physical    Patient: Anna [name] DOB: Feb 04, 1934 DOA: 03/20/2022 DOS: the patient was seen and examined on 03/20/2022 PCP: [name], MD  Patient coming from:  [facility] [place]  - lives with her son. She uses a cane.     Chief Complaint: back pain  HPI: Anna [name] is a 88 y.o. female with medical history significant of breast cancer, HLD , allergic rhinitis who presented to ED with complaints of back pain that radiated to the front of her chest. Pain first started on Sunday night. Pain located in left upper back. Pain was 8/10 and described as dull and heavy and constant. Nothing made it better or worse. She took tylenol and it didn't really help. She had no other associated symptoms including cough, shortness of breath, fevers, sweating. Pain continued and got so bad she called her son and told him she couldn't take the pain anymore so he brought her to the ER. She denies any radicular symptoms or numbness in her arms. She denies known sick contacts. Was at a church convention a week ago and around a lot of people.   She has chronic back pain, but this is lower down and from her OA/scoliosis. She never had chest pain that started on her chest. Pain radiated to her chest from her back. She did have some pressure on her chest. Her mother had CAD. Her maternal grandmother died of a heart attack.    He has been feeling good. Denies any fever/chills, vision changes/headaches, palpitations, shortness of breath or cough, abdominal pain, N/V/D, dysuria or leg swelling.    She does not smoke or drink alcohol.   ER Course:  vitals: afebrile, bp: 173/69, HR; 94, RR: 19, oxygen: 97%RA Pertinent labs: troponin 21>17, covid negative CXR: no acute process CTA chest: no PE. Pulmonary atelectasis and gas trapping, difficult to exclude viral/atypical infection. No consolidation. Small to moderate gastric hernia.  In ED: given rocephin and azithromycin and toradol. TRH asked to  admit.    Review of Systems: As mentioned in the history of present illness. All other systems reviewed and are negative. Past Medical History:  Diagnosis Date   Allergy    seasonal also with dogs and cats   Arthritis    Asthma    hx of in childhood    Bladder infection    hx of    Cancer ([place])    breast cancer bilat has had double mastectomy    Chronic kidney disease    hx of kidney stones last one pasted 1 month ago    COVID    Fibrocystic breast disease    H/O blood clots    during delivery occurred 50 years ago per left arm    PONV (postoperative nausea and vomiting)    pt states stays very sleepy for days following anesthesia    Past Surgical History:  Procedure Laterality Date   ABDOMINAL HYSTERECTOMY     APPENDECTOMY     BACK SURGERY     disc repaired 20 years ago    BREAST SURGERY     double mastectomy    CESAREAN SECTION     times 2   colonscopy      cyst repaired      following mammogram / cyst ruptured had to surgically repair area / left side    DILATION AND CURETTAGE OF UTERUS     EYE SURGERY     bilat cataract surgery    HAND  SURGERY     left hand / joint issues with thumb    LUNG SURGERY     related to fatty tumor / left approx 12 years ago    right knee arthroscopy      TOTAL KNEE ARTHROPLASTY Right 01/05/2015   Procedure: RIGHT TOTAL KNEE ARTHROPLASTY;  Surgeon: Latanya Maudlin, MD;  Location: [facility];  Service: Orthopedics;  Laterality: Right;   Social History:  reports that she has never smoked. She has never used smokeless tobacco. She reports that she does not drink alcohol and does not use drugs.  Allergies  Allergen Reactions   Demerol [Meperidine] Nausea And Vomiting   Sulfa Antibiotics Nausea And Vomiting    History reviewed. No pertinent family history.  Prior to Admission medications   Medication Sig Start Date End Date Taking? Authorizing Provider  alendronate (FOSAMAX) 70 MG tablet Take 1 tablet (70 mg total) by mouth every 7  (seven) days. Take with a full glass of water on an empty stomach. 10/09/21   [name], MD  ascorbic acid (VITAMIN C) 500 MG tablet Take by mouth.    [provider]  b complex vitamins capsule Take 1 capsule by mouth daily.    [provider]  cetirizine (ZYRTEC) 10 MG tablet Take by mouth.    [provider]  estradiol (ESTRACE) 0.1 MG/GM vaginal cream Place 1 Applicatorful vaginally at bedtime for 14 days, THEN 1 Applicatorful 2 (two) times a week. 11/24/21   [name], MD  fluticasone (FLONASE) 50 MCG/ACT nasal spray Place 1 spray into both nostrils at bedtime.    [provider]  ipratropium (ATROVENT) 0.03 % nasal spray Place 2 sprays into both nostrils every 12 (twelve) hours. 10/09/21   [name], MD  Lifitegrast Shirley Friar) 5 % SOLN Xiidra 5 % eye drops in a dropperette    [provider]  Lutein 10 MG TABS Take by mouth. Unsure of dose.Takes once a day    [provider]  Multiple Vitamins-Minerals (PRESERVISION AREDS 2 PO) Take by mouth.    [provider]  Omega-3 1000 MG CAPS Take by mouth.    [provider]    Physical Exam: Vitals:   03/20/22 1140 03/20/22 1245 03/20/22 1435 03/20/22 1441  BP: 116/71 (!) 139/59  (!) 145/123  Pulse: 86 96  91  Resp: 18 (!) 22    Temp:    98 F (36.7 C)  TempSrc:    Oral  SpO2: 95% 99%  98%  Weight:   59.3 kg   Height:   '4\' 11"'$  (1.499 m) '4\' 11"'$  (1.499 m)   General:  Appears calm and comfortable and is in NAD Eyes:  PERRL, EOMI, normal lids, iris ENT:  HOH, lips & tongue, mmm; appropriate dentition Neck:  no LAD, masses or thyromegaly; no carotid bruits Cardiovascular:  RRR, no m/r/g. No LE edema.  Respiratory:   CTA bilaterally with no wheezes/rales/rhonchi.  Normal respiratory effort. Abdomen:  soft, NT, ND, NABS Back:   normal alignment, no CVAT Skin:  no rash or induration seen on limited exam Musculoskeletal:  grossly normal tone BUE/BLE, good  ROM, no bony abnormality. TTP over right upper mid back.  Lower extremity:  No LE edema.  Limited foot exam with no ulcerations.  2+ distal pulses. Psychiatric:  grossly normal mood and affect, speech fluent and appropriate, AOx3 Neurologic:  CN 2-12 grossly intact, moves all extremities in coordinated fashion, sensation intact   Radiological Exams  on Admission: Independently reviewed - see discussion in A/P where applicable  CT Angio Chest PE W and/or Wo Contrast  Result Date: 03/20/2022 CLINICAL DATA:  88 year old female with left upper back pain, left chest pain. Prior breast cancer and mastectomy. EXAM: CT ANGIOGRAPHY CHEST WITH CONTRAST TECHNIQUE: Multidetector CT imaging of the chest was performed using the standard protocol during bolus administration of intravenous contrast. Multiplanar CT image reconstructions and MIPs were obtained to evaluate the vascular anatomy. RADIATION DOSE REDUCTION: This exam was performed according to the departmental dose-optimization program which includes automated exposure control, adjustment of the mA and/or kV according to patient size and/or use of iterative reconstruction technique. CONTRAST:  37m OMNIPAQUE IOHEXOL 350 MG/ML SOLN COMPARISON:  Portable chest 0342 hours today. Chest CT report 03/15/2015 (no images available). FINDINGS: Cardiovascular: Good contrast bolus timing in the pulmonary arterial tree. Minor respiratory motion. No focal filling defect identified in the pulmonary arteries to suggest acute pulmonary embolism. Calcified aortic atherosclerosis. Negative for thoracic aortic aneurysm or dissection. Mild cardiomegaly. No pericardial effusion. Calcified coronary artery atherosclerosis on series 7, image 183. Mediastinum/Nodes: Negative. No mediastinal mass or lymphadenopathy. Lungs/Pleura: Major airways remain patent although with atelectatic changes bilaterally. Atelectatic changes in both lungs with mosaic attenuation probably due to combined  gas trapping and atelectasis. 9 mm right upper lobe lung nodule on series 6, image 20, stable by report since the 2016 CT, and stable at that time since 2006. No pleural effusion. No consolidation. Upper Abdomen: Negative visible liver, right adrenal gland, and spleen. Small to moderate gastric hiatal hernia. Musculoskeletal: Intermittent spinal ankylosis (especially T6-T7). No acute osseous abnormality identified. Review of the MIP images confirms the above findings. IMPRESSION: 1. Negative for acute pulmonary embolus. 2. Pulmonary atelectasis and gas trapping, such that it is difficult to exclude viral/atypical respiratory infection. But no consolidation or pleural effusion. 3. Calcified coronary artery and Aortic Atherosclerosis (ICD10-I70.0). 4. Small to moderate gastric hiatal hernia. Electronically Signed   By: HGenevie AnnM.D.   On: 03/20/2022 05:59   DG Chest Portable 1 View  Result Date: 03/20/2022 CLINICAL DATA:  Left-sided upper back pain. EXAM: PORTABLE CHEST 1 VIEW COMPARISON:  Dec 28, 2014 FINDINGS: The heart size and mediastinal contours are within normal limits. There is mild calcification of the aortic arch. Both lungs are clear. Multilevel degenerative changes seen throughout the thoracic spine. IMPRESSION: No active cardiopulmonary disease. Electronically Signed   By: TVirgina NorfolkM.D.   On: 03/20/2022 03:54    EKG: Independently reviewed.  NSR with rate 93; nonspecific ST changes with no evidence of acute ischemia Possible atypical LBBB  Labs on Admission: I have personally reviewed the available labs and imaging studies at the time of the admission.  Pertinent labs:   troponin 21>17 covid negative  Assessment and Plan: Principal Problem:   Thoracic back pain Active Problems:   Chest pain   Elevated blood pressure reading    Assessment and Plan: * Thoracic back pain 88year old female presenting with 3 day history of worsening upper thoracic back pain that is reproducible  on exam. CT chest with gas trapping, difficult to exclude viral/atypical infection.  -obs to telemetry -covid negative. Check RVP and PCT -patient with no symptoms for URI. She denies fever, cough, shortness of breath, congestion.  -will continue rocephin, azithromycin until studies completed. Blood cx also ordered in ED.  -pain reproducible on exam, more consistent with MSK. Will try voltaren gel QID, heating pad, tylenol and robaxin prn. norco  for severe pain if needed. No radicular symptom to warrant MRI at this time, but consider if no improvement.   Chest pain Chest pain atypical with pain in back that radiates to front of chest. Pain reproducible on back  Troponin flat 21>17, possible atypical LBBB on ekg She has no chest pain when examined, no hx of heart disease Will check echo, leave on telemetry Check fasting lipid panel in AM and a1c    Elevated blood pressure reading No history of hypertension, but has had intermittently elevated pressure. Could be due to pain Trend and follow.   Advance Care Planning:   Code Status: DNR   Consults: none   DVT Prophylaxis: lovenox   Family Communication: son at bedside: Sharen Heck and granddaughter: emma   Severity of Illness: The appropriate patient status for this patient is OBSERVATION. Observation status is judged to be reasonable and necessary in order to provide the required intensity of service to ensure the patient's safety. The patient's presenting symptoms, physical exam findings, and initial radiographic and laboratory data in the context of their medical condition is felt to place them at decreased risk for further clinical deterioration. Furthermore, it is anticipated that the patient will be medically stable for discharge from the hospital within 2 midnights of admission.   Author: Orma Flaming, MD 03/20/2022 4:11 PM  For on call review [email or web address].

## 2022-03-20 NOTE — ED Notes (Signed)
Up to restroom, required min assistance, no signs of dyspnea on exertion. Speech wnl. Cont to deny any pain or discomfort at this time.

## 2022-03-20 NOTE — Assessment & Plan Note (Addendum)
Chest pain atypical with pain in back that radiates to front of chest. Pain reproducible on back  Troponin flat 21>17, possible atypical LBBB on ekg She has no chest pain when examined, no hx of heart disease Will check echo, leave on telemetry Check fasting lipid panel in AM and a1c

## 2022-03-21 ENCOUNTER — Observation Stay (HOSPITAL_BASED_OUTPATIENT_CLINIC_OR_DEPARTMENT_OTHER): Payer: Medicare Other

## 2022-03-21 DIAGNOSIS — R079 Chest pain, unspecified: Secondary | ICD-10-CM | POA: Diagnosis not present

## 2022-03-21 DIAGNOSIS — M546 Pain in thoracic spine: Secondary | ICD-10-CM | POA: Diagnosis not present

## 2022-03-21 LAB — ECHOCARDIOGRAM COMPLETE
AR max vel: 2.58 cm2
AV Peak grad: 9 mmHg
Ao pk vel: 1.5 m/s
Height: 59 in
P 1/2 time: 340 msec
S' Lateral: 2.7 cm
Weight: 2091.2 oz

## 2022-03-21 LAB — LIPID PANEL
Cholesterol: 177 mg/dL (ref 0–200)
HDL: 51 mg/dL (ref >40)
LDL Cholesterol: 111 mg/dL — ABNORMAL HIGH (ref 0–99)
Total CHOL/HDL Ratio: 3.5 RATIO
Triglycerides: 75 mg/dL (ref <150)
VLDL: 15 mg/dL (ref 0–40)

## 2022-03-21 LAB — BASIC METABOLIC PANEL
Anion gap: 7 (ref 5–15)
BUN: 15 mg/dL (ref 8–23)
CO2: 24 mmol/L (ref 22–32)
Calcium: 9 mg/dL (ref 8.9–10.3)
Chloride: 108 mmol/L (ref 98–111)
Creatinine, Ser: 0.78 mg/dL (ref 0.44–1.00)
GFR, Estimated: >60 mL/min (ref >60)
Glucose, Bld: 116 mg/dL — ABNORMAL HIGH (ref 70–99)
Potassium: 3.8 mmol/L (ref 3.5–5.1)
Sodium: 139 mmol/L (ref 135–145)

## 2022-03-21 LAB — CBC
HCT: 37.8 % (ref 36.0–46.0)
Hemoglobin: 12.6 g/dL (ref 12.0–15.0)
MCH: 30.2 pg (ref 26.0–34.0)
MCHC: 33.3 g/dL (ref 30.0–36.0)
MCV: 90.6 fL (ref 80.0–100.0)
Platelets: 236 10*3/uL (ref 150–400)
RBC: 4.17 MIL/uL (ref 3.87–5.11)
RDW: 13 % (ref 11.5–15.5)
WBC: 7.6 10*3/uL (ref 4.0–10.5)
nRBC: 0 % (ref 0.0–0.2)

## 2022-03-21 LAB — HEMOGLOBIN A1C
Hgb A1c MFr Bld: 5.2 % (ref 4.8–5.6)
Mean Plasma Glucose: 102.54 mg/dL

## 2022-03-21 MED ORDER — LIDOCAINE 5 % EX PTCH: 1.0000 | MEDICATED_PATCH | Freq: Two times a day (BID) | CUTANEOUS | 0 refills | Status: AC

## 2022-03-21 MED ORDER — HYDROCODONE-ACETAMINOPHEN 5-325 MG PO TABS: 1.0000 | ORAL_TABLET | Freq: Four times a day (QID) | ORAL | 0 refills | Status: AC | PRN

## 2022-03-21 NOTE — Progress Notes (Signed)
Patient discharging home. Vital signs stable at time of discharge as reflected in discharge summary. Discharge instructions given and verbal understanding returned. No questions at this time. 

## 2022-03-21 NOTE — Discharge Summary (Signed)
Physician Discharge Summary  Anna [name] CWC:[id number] DOB: 06-Jun-1934 DOA: 03/20/2022  PCP: [name], MD  Admit date: 03/20/2022 Discharge date: 03/21/2022  Admitted From: Home Disposition: Home  Recommendations for Outpatient Follow-up:  Follow up with PCP in 1-2 weeks Can use local lidocaine patches and Voltaren gel to relieve pain.  Home Health: N/A Equipment/Devices: N/A  Discharge Condition: Fair CODE STATUS: DNR Diet recommendation: Regular diet  Discharge summary: 88 year old with history of breast cancer and hyperlipidemia, does have a history of low back pain developed severe 8/10 pain on her left shoulder, blade of the scapula, took Tylenol that did not help so went to emergency room.  She does have chronic back pain but this is new.  Also had some referral of the pain to the anterior chest wall. In the emergency room hemodynamically stable.  Troponins nonischemic.  COVID-negative.  Chest x-ray normal.  CTA of the chest with no pulmonary embolism but evidence of pulmonary atelectasis and gas trapping.  No consolidation.  Admitted for observation.  Scapular and radicular pain: Musculoskeletal pain.  No skeletal injury.  Relieved with pain medications.  Local therapies.  Acute coronary syndrome ruled out.  Troponins and EKG nonischemic.  Echocardiogram essentially normal.  Suspected pneumonia: Ruled out.  No evidence of pneumonia on clinical exam, history as well normal procalcitonin levels.  COVID-19 negative.  No pulmonary symptoms.  No indication to treat for bacterial pneumonia.  Pain control.  Discharged with advice.    Discharge Diagnoses:  Principal Problem:   Thoracic back pain Active Problems:   Chest pain   Elevated blood pressure reading    Discharge Instructions  Discharge Instructions     Call MD for:  severe uncontrolled pain   Complete by: As directed    Diet general   Complete by: As directed    Increase activity slowly   Complete  by: As directed       Allergies as of 03/21/2022       Reactions   Demerol [meperidine] Nausea And Vomiting   Sulfa Antibiotics Nausea And Vomiting        Medication List     STOP taking these medications    estradiol 0.1 MG/GM vaginal cream Commonly known as: ESTRACE       TAKE these medications    alendronate 70 MG tablet Commonly known as: FOSAMAX Take 1 tablet (70 mg total) by mouth every 7 (seven) days. Take with a full glass of water on an empty stomach. What changed: when to take this   ascorbic acid 500 MG tablet Commonly known as: VITAMIN C Take 500 mg by mouth daily.   b complex vitamins capsule Take 1 capsule by mouth daily.   cetirizine 10 MG tablet Commonly known as: ZYRTEC Take 10 mg by mouth every evening.   fluticasone 50 MCG/ACT nasal spray Commonly known as: FLONASE Place 1 spray into both nostrils at bedtime.   ipratropium 0.03 % nasal spray Commonly known as: ATROVENT Place 2 sprays into both nostrils every 12 (twelve) hours.   latanoprost 0.005 % ophthalmic solution Commonly known as: XALATAN Place 1 drop into both eyes every evening.   lidocaine 5 % Commonly known as: Lidoderm Place 1 patch onto the skin every 12 (twelve) hours for 15 days. Remove & Discard patch within 12 hours or as directed by MD   Lutein 10 MG Tabs Take 1 tablet by mouth daily.   Omega-3 1000 MG Caps Take 1 capsule by mouth daily.  PRESERVISION AREDS 2 PO Take 1 tablet by mouth in the morning and at bedtime.   Xiidra 5 % Soln Generic drug: Lifitegrast Place 1 drop into both eyes 2 (two) times daily.        Allergies  Allergen Reactions   Demerol [Meperidine] Nausea And Vomiting   Sulfa Antibiotics Nausea And Vomiting    Consultations: None   Procedures/Studies: ECHOCARDIOGRAM COMPLETE  Result Date: 03/21/2022    ECHOCARDIOGRAM REPORT   Patient Name:   Anna [name] Date of Exam: 03/21/2022 Medical Rec #:  [id number]        Height:        59.0 in Accession #:    [id number]       Weight:       130.7 lb Date of Birth:  08-20-1934       BSA:          1.539 m Patient Age:    88 years         BP:           121/67 mmHg Patient Gender: F                HR:           92 bpm. Exam Location:  Inpatient Procedure: 2D Echo, Cardiac Doppler and Color Doppler Indications:    Chest Pain  History:        Patient has no prior history of Echocardiogram examinations.  Sonographer:    Jefferey Pica Referring Phys: [id number] [place]  1. Left ventricular ejection fraction, by estimation, is 60 to 65%. The left ventricle has normal function. The left ventricle has no regional wall motion abnormalities. There is mild left ventricular hypertrophy. Left ventricular diastolic parameters are indeterminate.  2. Right ventricular systolic function is normal. The right ventricular size is normal. There is normal pulmonary artery systolic pressure. The estimated right ventricular systolic pressure is 96.2 mmHg.  3. Left atrial size was mildly dilated.  4. The mitral valve is grossly normal. Trivial mitral valve regurgitation. No evidence of mitral stenosis.  5. The aortic valve is grossly normal. There is mild thickening of the aortic valve. Aortic valve regurgitation is not visualized. No aortic stenosis is present.  6. The inferior vena cava is normal in size with greater than 50% respiratory variability, suggesting right atrial pressure of 3 mmHg. FINDINGS  Left Ventricle: Left ventricular ejection fraction, by estimation, is 60 to 65%. The left ventricle has normal function. The left ventricle has no regional wall motion abnormalities. The left ventricular internal cavity size was normal in size. There is  mild left ventricular hypertrophy. Left ventricular diastolic parameters are indeterminate. Right Ventricle: The right ventricular size is normal. No increase in right ventricular wall thickness. Right ventricular systolic function is normal. There is  normal pulmonary artery systolic pressure. The tricuspid regurgitant velocity is 2.50 m/s, and  with an assumed right atrial pressure of 3 mmHg, the estimated right ventricular systolic pressure is 95.2 mmHg. Left Atrium: Left atrial size was mildly dilated. Right Atrium: Right atrial size was normal in size. Pericardium: Trivial pericardial effusion is present. Mitral Valve: The mitral valve is grossly normal. Mild mitral annular calcification. Trivial mitral valve regurgitation. No evidence of mitral valve stenosis. The mean mitral valve gradient is 4.8 mmHg with average heart rate of 99 bpm. Tricuspid Valve: The tricuspid valve is normal in structure. Tricuspid valve regurgitation is trivial. No evidence of tricuspid stenosis. Aortic Valve: The aortic valve  is grossly normal. There is mild thickening of the aortic valve. Aortic valve regurgitation is not visualized. Aortic regurgitation PHT measures 340 msec. No aortic stenosis is present. Aortic valve peak gradient measures 9.0 mmHg. Pulmonic Valve: The pulmonic valve was normal in structure. Pulmonic valve regurgitation is trivial. No evidence of pulmonic stenosis. Aorta: The aortic root is normal in size and structure. Venous: The inferior vena cava is normal in size with greater than 50% respiratory variability, suggesting right atrial pressure of 3 mmHg. IAS/Shunts: There is redundancy of the interatrial septum. The interatrial septum appears to be lipomatous. No atrial level shunt detected by color flow Doppler.  LEFT VENTRICLE PLAX 2D LVIDd:         4.10 cm LVIDs:         2.70 cm LV PW:         1.20 cm LV IVS:        1.30 cm LVOT diam:     2.00 cm LV SV:         81 LV SV Index:   52 LVOT Area:     3.14 cm  RIGHT VENTRICLE             IVC RV Basal diam:  2.60 cm     IVC diam: 1.90 cm RV S prime:     18.30 cm/s TAPSE (M-mode): 1.6 cm LEFT ATRIUM             Index        RIGHT ATRIUM          Index LA diam:        3.30 cm 2.14 cm/m   RA Area:     8.30 cm  LA Vol (A2C):   55.7 ml 36.19 ml/m  RA Volume:   13.60 ml 8.84 ml/m LA Vol (A4C):   45.9 ml 29.82 ml/m LA Biplane Vol: 51.8 ml 33.65 ml/m  AORTIC VALVE                 PULMONIC VALVE AV Area (Vmax): 2.58 cm     PV Vmax:       0.88 m/s AV Vmax:        150.00 cm/s  PV Peak grad:  3.1 mmHg AV Peak Grad:   9.0 mmHg LVOT Vmax:      123.00 cm/s LVOT Vmean:     82.500 cm/s LVOT VTI:       0.257 m AI PHT:         340 msec  AORTA Ao Root diam: 3.20 cm Ao Asc diam:  3.30 cm MITRAL VALVE           TRICUSPID VALVE MV Mean grad: 4.8 mmHg TR Peak grad:   25.0 mmHg                        TR Vmax:        250.00 cm/s                         SHUNTS                        Systemic VTI:  0.26 m                        Systemic Diam: 2.00 cm Cherlynn Kaiser MD Electronically signed by Cherlynn Kaiser MD Signature Date/Time: 03/21/2022/3:17:24 PM  Final    CT Angio Chest PE W and/or Wo Contrast  Result Date: 03/20/2022 CLINICAL DATA:  88 year old female with left upper back pain, left chest pain. Prior breast cancer and mastectomy. EXAM: CT ANGIOGRAPHY CHEST WITH CONTRAST TECHNIQUE: Multidetector CT imaging of the chest was performed using the standard protocol during bolus administration of intravenous contrast. Multiplanar CT image reconstructions and MIPs were obtained to evaluate the vascular anatomy. RADIATION DOSE REDUCTION: This exam was performed according to the departmental dose-optimization program which includes automated exposure control, adjustment of the mA and/or kV according to patient size and/or use of iterative reconstruction technique. CONTRAST:  13m OMNIPAQUE IOHEXOL 350 MG/ML SOLN COMPARISON:  Portable chest 0342 hours today. Chest CT report 03/15/2015 (no images available). FINDINGS: Cardiovascular: Good contrast bolus timing in the pulmonary arterial tree. Minor respiratory motion. No focal filling defect identified in the pulmonary arteries to suggest acute pulmonary embolism. Calcified aortic  atherosclerosis. Negative for thoracic aortic aneurysm or dissection. Mild cardiomegaly. No pericardial effusion. Calcified coronary artery atherosclerosis on series 7, image 183. Mediastinum/Nodes: Negative. No mediastinal mass or lymphadenopathy. Lungs/Pleura: Major airways remain patent although with atelectatic changes bilaterally. Atelectatic changes in both lungs with mosaic attenuation probably due to combined gas trapping and atelectasis. 9 mm right upper lobe lung nodule on series 6, image 20, stable by report since the 2016 CT, and stable at that time since 2006. No pleural effusion. No consolidation. Upper Abdomen: Negative visible liver, right adrenal gland, and spleen. Small to moderate gastric hiatal hernia. Musculoskeletal: Intermittent spinal ankylosis (especially T6-T7). No acute osseous abnormality identified. Review of the MIP images confirms the above findings. IMPRESSION: 1. Negative for acute pulmonary embolus. 2. Pulmonary atelectasis and gas trapping, such that it is difficult to exclude viral/atypical respiratory infection. But no consolidation or pleural effusion. 3. Calcified coronary artery and Aortic Atherosclerosis (ICD10-I70.0). 4. Small to moderate gastric hiatal hernia. Electronically Signed   By: HGenevie AnnM.D.   On: 03/20/2022 05:59   DG Chest Portable 1 View  Result Date: 03/20/2022 CLINICAL DATA:  Left-sided upper back pain. EXAM: PORTABLE CHEST 1 VIEW COMPARISON:  Dec 28, 2014 FINDINGS: The heart size and mediastinal contours are within normal limits. There is mild calcification of the aortic arch. Both lungs are clear. Multilevel degenerative changes seen throughout the thoracic spine. IMPRESSION: No active cardiopulmonary disease. Electronically Signed   By: TVirgina NorfolkM.D.   On: 03/20/2022 03:54   (Echo, Carotid, EGD, Colonoscopy, ERCP)    Subjective: Patient seen in the morning rounds.  Detailed discussion with patient and her son at the bedside.  Patient still  has mild pain on her left scapular region but better than yesterday.  Denies any cough cold congestion, fever.  Telemetry monitor with sinus rhythm.   Discharge Exam: Vitals:   03/21/22 0551 03/21/22 1111  BP: 121/67 (!) 128/59  Pulse: 91 87  Resp: 16 19  Temp: 98.1 F (36.7 C) 97.6 F (36.4 C)  SpO2: 95%    Vitals:   03/20/22 1802 03/20/22 2027 03/21/22 0551 03/21/22 1111  BP: (!) 158/74 (!) 145/73 121/67 (!) 128/59  Pulse: 92 100 91 87  Resp: '16 16 16 19  '$ Temp: (!) 96.1 F (35.6 C) 98.7 F (37.1 C) 98.1 F (36.7 C) 97.6 F (36.4 C)  TempSrc: Oral Oral Oral Oral  SpO2: 99% 95% 95%   Weight:      Height:        General: Pt is alert, awake, not in acute  distress Cardiovascular: RRR, S1/S2 +, no rubs, no gallops Respiratory: CTA bilaterally, no wheezing, no rhonchi, no palpable tenderness. Abdominal: Soft, NT, ND, bowel sounds + Extremities: Chronic skin changes and dry scaly skin on the left shin.  No edema.    The results of significant diagnostics from this hospitalization (including imaging, microbiology, ancillary and laboratory) are listed below for reference.     Microbiology: Recent Results (from the past 240 hour(s))  SARS Coronavirus 2 by RT PCR (hospital order, performed in [facility] hospital lab) *cepheid single result test* Anterior Nasal Swab     Status: None   Collection Time: 03/20/22  6:08 AM   Specimen: Anterior Nasal Swab  Result Value Ref Range Status   SARS Coronavirus 2 by RT PCR NEGATIVE NEGATIVE Final    Comment: (NOTE) SARS-CoV-2 target nucleic acids are NOT DETECTED.  The SARS-CoV-2 RNA is generally detectable in upper and lower respiratory specimens during the acute phase of infection. The lowest concentration of SARS-CoV-2 viral copies this assay can detect is 250 copies / mL. A negative result does not preclude SARS-CoV-2 infection and should not be used as the sole basis for treatment or other patient management decisions.  A  negative result may occur with improper specimen collection / handling, submission of specimen other than nasopharyngeal swab, presence of viral mutation(s) within the areas targeted by this assay, and inadequate number of viral copies (<250 copies / mL). A negative result must be combined with clinical observations, patient history, and epidemiological information.  Fact Sheet for Patients:   https://www.patel.info/  Fact Sheet for Healthcare Providers: https://hall.com/  This test is not yet approved or  cleared by the [place] FDA and has been authorized for detection and/or diagnosis of SARS-CoV-2 by FDA under an Emergency Use Authorization (EUA).  This EUA will remain in effect (meaning this test can be used) for the duration of the COVID-19 declaration under Section 564(b)(1) of the Act, 21 U.S.C. section 360bbb-3(b)(1), unless the authorization is terminated or revoked sooner.  Performed at [facility], [address] [place], [place] [address]   Blood culture (routine x 2)     Status: None (Preliminary result)   Collection Time: 03/20/22  7:08 AM   Specimen: BLOOD  Result Value Ref Range Status   Specimen Description   Final    BLOOD BLOOD LEFT FOREARM Performed at [facility], [address] [place], [place] [address]    Special Requests   Final    BOTTLES DRAWN AEROBIC AND ANAEROBIC Blood Culture adequate volume Performed at [facility], [address] [place], [place] [address]    Culture   Final    NO GROWTH < 24 HOURS Performed at [facility], [place] [address], [place], [place] [address]    Report Status PENDING  Incomplete  Blood culture (routine x 2)     Status: None (Preliminary result)   Collection Time: 03/20/22  3:00 PM   Specimen: BLOOD LEFT ARM  Result Value Ref Range Status   Specimen Description BLOOD LEFT ARM  Final   Special Requests   Final    BOTTLES  DRAWN AEROBIC AND ANAEROBIC Blood Culture adequate volume Performed at [facility], [place] [address], [place],  [address]    Culture PENDING  Incomplete   Report Status PENDING  Incomplete  Respiratory (~20 pathogens) panel by PCR     Status: None   Collection Time: 03/20/22  3:02 PM   Specimen: Nasopharyngeal Swab; Respiratory  Result Value Ref Range Status   Adenovirus NOT DETECTED NOT DETECTED Final   Coronavirus 229E NOT DETECTED NOT DETECTED Final    Comment: (NOTE) The Coronavirus on the Respiratory Panel, DOES NOT test for the novel  Coronavirus (2019 nCoV)    Coronavirus HKU1 NOT DETECTED NOT DETECTED Final   Coronavirus NL63 NOT DETECTED NOT DETECTED Final   Coronavirus OC43 NOT DETECTED NOT DETECTED Final   Metapneumovirus NOT DETECTED NOT DETECTED Final   Rhinovirus / Enterovirus NOT DETECTED NOT DETECTED Final   Influenza A NOT DETECTED NOT DETECTED Final   Influenza B NOT DETECTED NOT DETECTED Final   Parainfluenza Virus 1 NOT DETECTED NOT DETECTED Final   Parainfluenza Virus 2 NOT DETECTED NOT DETECTED Final   Parainfluenza Virus 3 NOT DETECTED NOT DETECTED Final   Parainfluenza Virus 4 NOT DETECTED NOT DETECTED Final   Respiratory Syncytial Virus NOT DETECTED NOT DETECTED Final   Bordetella pertussis NOT DETECTED NOT DETECTED Final   Bordetella Parapertussis NOT DETECTED NOT DETECTED Final   Chlamydophila pneumoniae NOT DETECTED NOT DETECTED Final   Mycoplasma pneumoniae NOT DETECTED NOT DETECTED Final    Comment: Performed at [facility], [place]. [address] [place], [place] [address]     Labs: BNP (last 3 results) No results for input(s): "BNP" in the last 8760 hours. Basic Metabolic Panel: Recent Labs  Lab 03/20/22 0338 03/21/22 0159  NA 141 139  K 3.6 3.8  CL 109 108  CO2 25 24  GLUCOSE 102* 116*  BUN 12 15  CREATININE 0.65 0.78  CALCIUM 9.1 9.0   Liver Function Tests: No results for input(s): "AST", "ALT", "ALKPHOS", "BILITOT",  "PROT", "ALBUMIN" in the last 168 hours. No results for input(s): "LIPASE", "AMYLASE" in the last 168 hours. No results for input(s): "AMMONIA" in the last 168 hours. CBC: Recent Labs  Lab 03/20/22 0338 03/21/22 0159  WBC 6.0 7.6  NEUTROABS 2.9  --   HGB 13.8 12.6  HCT 41.4 37.8  MCV 90.8 90.6  PLT 233 236   Cardiac Enzymes: No results for input(s): "CKTOTAL", "CKMB", "CKMBINDEX", "TROPONINI" in the last 168 hours. BNP: Invalid input(s): "POCBNP" CBG: No results for input(s): "GLUCAP" in the last 168 hours. D-Dimer No results for input(s): "DDIMER" in the last 72 hours. Hgb A1c Recent Labs    03/21/22 0159  HGBA1C 5.2   Lipid Profile Recent Labs    03/21/22 0159  CHOL 177  HDL 51  LDLCALC 111*  TRIG 75  CHOLHDL 3.5   Thyroid function studies No results for input(s): "TSH", "T4TOTAL", "T3FREE", "THYROIDAB" in the last 72 hours.  Invalid input(s): "FREET3" Anemia work up No results for input(s): "VITAMINB12", "FOLATE", "FERRITIN", "TIBC", "IRON", "RETICCTPCT" in the last 72 hours. Urinalysis    Component Value Date/Time   COLORURINE YELLOW 12/28/2014 1600   APPEARANCEUR CLEAR 12/28/2014 1600   LABSPEC 1.018 12/28/2014 1600   PHURINE 6.0 12/28/2014 1600   GLUCOSEU NEGATIVE 12/28/2014 1600   HGBUR NEGATIVE 12/28/2014 1600   BILIRUBINUR neg 11/24/2021 1113   KETONESUR NEGATIVE 12/28/2014 1600   PROTEINUR Negative 11/24/2021 1113   PROTEINUR NEGATIVE 12/28/2014 1600   UROBILINOGEN 0.2 11/24/2021 1113   UROBILINOGEN 1.0 12/28/2014 1600   NITRITE neg 11/24/2021 1113   NITRITE NEGATIVE 12/28/2014 1600   LEUKOCYTESUR Negative 11/24/2021 1113   Sepsis Labs Recent Labs  Lab 03/20/22 0338 03/21/22 0159  WBC 6.0 7.6   Microbiology Recent Results (from the past  240 hour(s))  SARS Coronavirus 2 by RT PCR (hospital order, performed in [facility] hospital lab) *cepheid single result test* Anterior Nasal Swab     Status: None   Collection Time: 03/20/22  6:08  AM   Specimen: Anterior Nasal Swab  Result Value Ref Range Status   SARS Coronavirus 2 by RT PCR NEGATIVE NEGATIVE Final    Comment: (NOTE) SARS-CoV-2 target nucleic acids are NOT DETECTED.  The SARS-CoV-2 RNA is generally detectable in upper and lower respiratory specimens during the acute phase of infection. The lowest concentration of SARS-CoV-2 viral copies this assay can detect is 250 copies / mL. A negative result does not preclude SARS-CoV-2 infection and should not be used as the sole basis for treatment or other patient management decisions.  A negative result may occur with improper specimen collection / handling, submission of specimen other than nasopharyngeal swab, presence of viral mutation(s) within the areas targeted by this assay, and inadequate number of viral copies (<250 copies / mL). A negative result must be combined with clinical observations, patient history, and epidemiological information.  Fact Sheet for Patients:   https://www.patel.info/  Fact Sheet for Healthcare Providers: https://hall.com/  This test is not yet approved or  cleared by the [place] FDA and has been authorized for detection and/or diagnosis of SARS-CoV-2 by FDA under an Emergency Use Authorization (EUA).  This EUA will remain in effect (meaning this test can be used) for the duration of the COVID-19 declaration under Section 564(b)(1) of the Act, 21 U.S.C. section 360bbb-3(b)(1), unless the authorization is terminated or revoked sooner.  Performed at [facility], [address] [place], [place] [address]   Blood culture (routine x 2)     Status: None (Preliminary result)   Collection Time: 03/20/22  7:08 AM   Specimen: BLOOD  Result Value Ref Range Status   Specimen Description   Final    BLOOD BLOOD LEFT FOREARM Performed at [facility], [address] [place], [place] [address]    Special Requests    Final    BOTTLES DRAWN AEROBIC AND ANAEROBIC Blood Culture adequate volume Performed at [facility], [address] [place], [place] [address]    Culture   Final    NO GROWTH < 24 HOURS Performed at [facility], [place] [address] [place], [place] [address]    Report Status PENDING  Incomplete  Blood culture (routine x 2)     Status: None (Preliminary result)   Collection Time: 03/20/22  3:00 PM   Specimen: BLOOD LEFT ARM  Result Value Ref Range Status   Specimen Description BLOOD LEFT ARM  Final   Special Requests   Final    BOTTLES DRAWN AEROBIC AND ANAEROBIC Blood Culture adequate volume Performed at [facility], [place] [address], [place], [place] [address]    Culture PENDING  Incomplete   Report Status PENDING  Incomplete  Respiratory (~20 pathogens) panel by PCR     Status: None   Collection Time: 03/20/22  3:02 PM   Specimen: Nasopharyngeal Swab; Respiratory  Result Value Ref Range Status   Adenovirus NOT DETECTED NOT DETECTED Final   Coronavirus 229E NOT DETECTED NOT DETECTED Final    Comment: (NOTE) The Coronavirus on the Respiratory Panel, DOES NOT test for the novel  Coronavirus (2019 nCoV)    Coronavirus HKU1 NOT DETECTED NOT DETECTED Final   Coronavirus NL63 NOT DETECTED NOT DETECTED Final  Coronavirus OC43 NOT DETECTED NOT DETECTED Final   Metapneumovirus NOT DETECTED NOT DETECTED Final   Rhinovirus / Enterovirus NOT DETECTED NOT DETECTED Final   Influenza A NOT DETECTED NOT DETECTED Final   Influenza B NOT DETECTED NOT DETECTED Final   Parainfluenza Virus 1 NOT DETECTED NOT DETECTED Final   Parainfluenza Virus 2 NOT DETECTED NOT DETECTED Final   Parainfluenza Virus 3 NOT DETECTED NOT DETECTED Final   Parainfluenza Virus 4 NOT DETECTED NOT DETECTED Final   Respiratory Syncytial Virus NOT DETECTED NOT DETECTED Final   Bordetella pertussis NOT DETECTED NOT DETECTED Final   Bordetella Parapertussis NOT DETECTED NOT DETECTED Final    Chlamydophila pneumoniae NOT DETECTED NOT DETECTED Final   Mycoplasma pneumoniae NOT DETECTED NOT DETECTED Final    Comment: Performed at [facility], [place] [address], [place], [place] [address]     Time coordinating discharge: 32 minutes  SIGNED:   Barb Merino, MD  [facility] 03/21/2022, 4:18 PM

## 2022-03-25 LAB — CULTURE, BLOOD (ROUTINE X 2)
Culture: NO GROWTH
Culture: NO GROWTH
Special Requests: ADEQUATE
Special Requests: ADEQUATE

## 2022-04-09 ENCOUNTER — Ambulatory Visit: Payer: Medicare Other | Admitting: Family Medicine

## 2022-05-07 ENCOUNTER — Ambulatory Visit (INDEPENDENT_AMBULATORY_CARE_PROVIDER_SITE_OTHER): Payer: Medicare Other | Admitting: Family Medicine

## 2022-05-07 VITALS — BP 134/68 | HR 89 | Temp 97.4°F | Ht 59.0 in | Wt 136.6 lb

## 2022-05-07 DIAGNOSIS — B0229 Other postherpetic nervous system involvement: Secondary | ICD-10-CM | POA: Diagnosis not present

## 2022-05-07 DIAGNOSIS — M436 Torticollis: Secondary | ICD-10-CM

## 2022-05-07 MED ORDER — GABAPENTIN 300 MG PO CAPS: 300.0000 mg | ORAL_CAPSULE | Freq: Every day | ORAL | 5 refills | Status: DC

## 2022-05-07 NOTE — Progress Notes (Signed)
[facility] [address] [place] [address] Dept: [phone] Dept Fax: [phone]  Office Visit  Subjective:    Patient ID: Anna [name], female    DOB: Jan 12, 1934, 88 y.o..   MRN: [id number]  Chief Complaint  Patient presents with   Follow-up    F/u      History of Present Illness:  Patient is in today for Anna [name] presents complaining of pain in her left back associated with an outbreak of herpes zoster that occurred in late July. She notes that when she developed the initial pain, she was seen and admitted to the hospital with the concern that she might have pain from a pneumonia. As this was not confirmed, she was told she might have scar tissue. Apparently after discharge, she broke out in a rash int he left mid back c/w zoster. She was treated with valacyclovir and started on gabapentin 300 mg TID. She notes that the gabapentin was making her dizzy. She is now only taking this at bedtime. She has found that this not only helps with the pain in her mid chest, but has also benefited her peripheral neuropathy.  Since that time, Anna [name] has been having some left neck and shoulder pain. She notes some stiffness in this area, esp. in the morning. She has been doing a nightly ritual of applying Voltaren gel and a lidocaine patch, which has been helpful at night. She wasn't sure if this was somehow related tot he same issue.  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   Sensorineural hearing loss (SNHL) 08/07/2018    Idiopathic peripheral neuropathy 01/25/2016   Nephrolithiasis 03/29/2015   History of total knee arthroplasty 01/05/2015   Rosacea 07/14/2014   Degenerative joint disease of cervical and lumbar spine 07/14/2014   Microscopic hematuria 07/14/2014   Mammogram abnormal 07/14/2014   History of breast cancer in female 07/14/2014   Atrophic vaginitis 07/14/2014   Allergic rhinitis 07/14/2014   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   Vitamin D deficiency 05/18/2014   Osteoporosis 05/18/2014   Diverticulosis of large intestine without hemorrhage 05/18/2014   Past Surgical History:  Procedure Laterality Date   ABDOMINAL HYSTERECTOMY     APPENDECTOMY     BACK SURGERY     disc repaired 20 years ago    BREAST SURGERY     double mastectomy    CESAREAN SECTION     times 2   colonscopy      cyst repaired      following mammogram / cyst ruptured had to surgically repair area / left side    DILATION AND CURETTAGE OF UTERUS     EYE SURGERY     bilat cataract surgery    HAND SURGERY     left hand / joint issues with thumb    LUNG SURGERY     related to fatty tumor / left approx 12 years ago    right knee arthroscopy      TOTAL KNEE ARTHROPLASTY Right 01/05/2015   Procedure: RIGHT TOTAL KNEE ARTHROPLASTY;  Surgeon: Latanya Maudlin, MD;  Location: [facility];  Service: Orthopedics;  Laterality: Right;   No family history on file.  Outpatient Medications Prior to Visit  Medication Sig Dispense Refill   ascorbic acid (VITAMIN C) 500 MG tablet Take 500 mg by mouth daily.     b complex vitamins capsule Take 1 capsule by mouth daily.     cetirizine (ZYRTEC) 10 MG tablet Take 10 mg by mouth every evening.     fluticasone (FLONASE) 50 MCG/ACT nasal spray Place 1 spray into both nostrils at bedtime.     latanoprost (XALATAN) 0.005 % ophthalmic solution Place 1 drop into both eyes every evening.     Lifitegrast (XIIDRA) 5 % SOLN Place 1 drop into both eyes 2 (two) times daily.     Lutein 10 MG TABS Take 1 tablet  by mouth daily.     Multiple Vitamins-Minerals (PRESERVISION AREDS 2 PO) Take 1 tablet by mouth in the morning and at bedtime.     Omega-3 1000 MG CAPS Take 1 capsule by mouth daily.     gabapentin (NEURONTIN) 300 MG capsule Take 300 mg by mouth 3 (three) times daily.     alendronate (FOSAMAX) 70 MG tablet Take 1 tablet (70 mg total) by mouth every 7 (seven) days. Take with a full glass of water on an empty stomach. (Patient not taking: Reported on 05/07/2022) 4 tablet 11   ipratropium (ATROVENT) 0.03 % nasal spray Place 2 sprays into both nostrils every 12 (twelve) hours. (Patient not taking: Reported on 05/07/2022) 30 mL 12   No facility-administered medications prior to visit.   Allergies  Allergen Reactions   Demerol [Meperidine] Nausea And Vomiting   Sulfa Antibiotics Nausea And Vomiting     Objective:   Today's Vitals   05/07/22 1331  BP: 134/68  Pulse: 89  Temp: (!) 97.4 F (36.3 C)  TempSrc: Temporal  SpO2: 99%  Weight: 136 lb 9.6 oz (62 kg)  Height: '4\' 11"'$  (1.499 m)   Body mass index is 27.59 kg/m.   General: Well developed, well nourished. No acute distress. Neck: Supple. No rash noted on neck. Mild tenderness over the posterior scalene muscles. Chest: There is no current rash or residual lesions. There remains some pain with touch in this area. Psych: Alert and oriented. Normal mood and affect.  Health Maintenance Due  Topic Date Due   Zoster Vaccines- Shingrix (1 of 2) Never done   Pneumonia Vaccine 78+ Years old (1 - PCV) Never done   DEXA SCAN  Never done   INFLUENZA VACCINE  Never done     Assessment & Plan:   1. Post herpetic neuralgia By history, it does sound like Anna [name] had an outbreak of shingles. As she has had some response to gabapentin for both the her neuropathy and the current pain, we will continue this. It is not clear if she has some subacute herpetic neuralgia vs. PHN. I will plan to see her back in 2 months to assess if the pain  persists.  - gabapentin (NEURONTIN) 300 MG capsule; Take 1 capsule (300 mg total) by mouth at bedtime.  Dispense: 30 capsule; Refill: 5  2. Wry neck I suspect the left neck issues are of a muscular origin in her neck. I recommend she continue her current regimen, but consider using heat on this are earlier in the evening, prior to applying her lidocaine patch or Voltaren gel. She is considering going to see a chiropractor about this issue.   Return in about 2 months (around 07/07/2022)  for Reassessment.   [name], MD

## 2022-05-28 ENCOUNTER — Ambulatory Visit: Payer: Medicare Other | Admitting: Family Medicine

## 2022-06-12 ENCOUNTER — Ambulatory Visit (INDEPENDENT_AMBULATORY_CARE_PROVIDER_SITE_OTHER): Payer: Medicare Other

## 2022-06-12 VITALS — Ht 59.0 in | Wt 135.0 lb

## 2022-06-12 DIAGNOSIS — Z0000 Encounter for general adult medical examination without abnormal findings: Secondary | ICD-10-CM

## 2022-06-12 DIAGNOSIS — Z780 Asymptomatic menopausal state: Secondary | ICD-10-CM

## 2022-06-12 NOTE — Patient Instructions (Signed)
Anna [name] , Thank you for taking time to come for your Medicare Wellness Visit. I appreciate your ongoing commitment to your health goals. Please review the following plan we discussed and let me know if I can assist you in the future.   These are the goals we discussed:  Goals      Patient Stated     Maintain current lifestyle        This is a list of the screening recommended for you and due dates:  Health Maintenance  Topic Date Due   Zoster (Shingles) Vaccine (1 of 2) Never done   Pneumonia Vaccine (1 - PCV) Never done   DEXA scan (bone density measurement)  Never done   COVID-19 Vaccine (3 - Pfizer risk series) 12/08/2019   Flu Shot  Never done   Tetanus Vaccine  02/11/2032   HPV Vaccine  Aged Out    Advanced directives: Please bring a copy of your health care power of attorney and living will to the office to be added to your chart at your convenience.   Conditions/risks identified: Aim for 30 minutes of exercise or brisk walking, 6-8 glasses of water, and 5 servings of fruits and vegetables each day.   Next appointment: Follow up in one year for your annual wellness visit    Preventive Care 65 Years and Older, Female Preventive care refers to lifestyle choices and visits with your health care provider that can promote health and wellness. What does preventive care include? A yearly physical exam. This is also called an annual well check. Dental exams once or twice a year. Routine eye exams. Ask your health care provider how often you should have your eyes checked. Personal lifestyle choices, including: Daily care of your teeth and gums. Regular physical activity. Eating a healthy diet. Avoiding tobacco and drug use. Limiting alcohol use. Practicing safe sex. Taking low-dose aspirin every day. Taking vitamin and mineral supplements as recommended by your health care provider. What happens during an annual well check? The services and screenings done by your  health care provider during your annual well check will depend on your age, overall health, lifestyle risk factors, and family history of disease. Counseling  Your health care provider may ask you questions about your: Alcohol use. Tobacco use. Drug use. Emotional well-being. Home and relationship well-being. Sexual activity. Eating habits. History of falls. Memory and ability to understand (cognition). Work and work Statistician. Reproductive health. Screening  You may have the following tests or measurements: Height, weight, and BMI. Blood pressure. Lipid and cholesterol levels. These may be checked every 5 years, or more frequently if you are over 43 years old. Skin check. Lung cancer screening. You may have this screening every year starting at age 67 if you have a 30-pack-year history of smoking and currently smoke or have quit within the past 15 years. Fecal occult blood test (FOBT) of the stool. You may have this test every year starting at age 57. Flexible sigmoidoscopy or colonoscopy. You may have a sigmoidoscopy every 5 years or a colonoscopy every 10 years starting at age 56. Hepatitis C blood test. Hepatitis B blood test. Sexually transmitted disease (STD) testing. Diabetes screening. This is done by checking your blood sugar (glucose) after you have not eaten for a while (fasting). You may have this done every 1-3 years. Bone density scan. This is done to screen for osteoporosis. You may have this done starting at age 24. Mammogram. This may be done every 1-2  years. Talk to your health care provider about how often you should have regular mammograms. Talk with your health care provider about your test results, treatment options, and if necessary, the need for more tests. Vaccines  Your health care provider may recommend certain vaccines, such as: Influenza vaccine. This is recommended every year. Tetanus, diphtheria, and acellular pertussis (Tdap, Td) vaccine. You may  need a Td booster every 10 years. Zoster vaccine. You may need this after age 36. Pneumococcal 13-valent conjugate (PCV13) vaccine. One dose is recommended after age 72. Pneumococcal polysaccharide (PPSV23) vaccine. One dose is recommended after age 56. Talk to your health care provider about which screenings and vaccines you need and how often you need them. This information is not intended to replace advice given to you by your health care provider. Make sure you discuss any questions you have with your health care provider. Document Released: 09/09/2015 Document Revised: 05/02/2016 Document Reviewed: 06/14/2015 Elsevier Interactive Patient Education  2017 [place] Prevention in the Home Falls can cause injuries. They can happen to people of all ages. There are many things you can do to make your home safe and to help prevent falls. What can I do on the outside of my home? Regularly fix the edges of walkways and driveways and fix any cracks. Remove anything that might make you trip as you walk through a door, such as a raised step or threshold. Trim any bushes or trees on the path to your home. Use bright outdoor lighting. Clear any walking paths of anything that might make someone trip, such as rocks or tools. Regularly check to see if handrails are loose or broken. Make sure that both sides of any steps have handrails. Any raised decks and porches should have guardrails on the edges. Have any leaves, snow, or ice cleared regularly. Use sand or salt on walking paths during winter. Clean up any spills in your garage right away. This includes oil or grease spills. What can I do in the bathroom? Use night lights. Install grab bars by the toilet and in the tub and shower. Do not use towel bars as grab bars. Use non-skid mats or decals in the tub or shower. If you need to sit down in the shower, use a plastic, non-slip stool. Keep the floor dry. Clean up any water that spills on  the floor as soon as it happens. Remove soap buildup in the tub or shower regularly. Attach bath mats securely with double-sided non-slip rug tape. Do not have throw rugs and other things on the floor that can make you trip. What can I do in the bedroom? Use night lights. Make sure that you have a light by your bed that is easy to reach. Do not use any sheets or blankets that are too big for your bed. They should not hang down onto the floor. Have a firm chair that has side arms. You can use this for support while you get dressed. Do not have throw rugs and other things on the floor that can make you trip. What can I do in the kitchen? Clean up any spills right away. Avoid walking on wet floors. Keep items that you use a lot in easy-to-reach places. If you need to reach something above you, use a strong step stool that has a grab bar. Keep electrical cords out of the way. Do not use floor polish or wax that makes floors slippery. If you must use wax, use non-skid floor  wax. Do not have throw rugs and other things on the floor that can make you trip. What can I do with my stairs? Do not leave any items on the stairs. Make sure that there are handrails on both sides of the stairs and use them. Fix handrails that are broken or loose. Make sure that handrails are as long as the stairways. Check any carpeting to make sure that it is firmly attached to the stairs. Fix any carpet that is loose or worn. Avoid having throw rugs at the top or bottom of the stairs. If you do have throw rugs, attach them to the floor with carpet tape. Make sure that you have a light switch at the top of the stairs and the bottom of the stairs. If you do not have them, ask someone to add them for you. What else can I do to help prevent falls? Wear shoes that: Do not have high heels. Have rubber bottoms. Are comfortable and fit you well. Are closed at the toe. Do not wear sandals. If you use a stepladder: Make sure  that it is fully opened. Do not climb a closed stepladder. Make sure that both sides of the stepladder are locked into place. Ask someone to hold it for you, if possible. Clearly mark and make sure that you can see: Any grab bars or handrails. First and last steps. Where the edge of each step is. Use tools that help you move around (mobility aids) if they are needed. These include: Canes. Walkers. Scooters. Crutches. Turn on the lights when you go into a dark area. Replace any light bulbs as soon as they burn out. Set up your furniture so you have a clear path. Avoid moving your furniture around. If any of your floors are uneven, fix them. If there are any pets around you, be aware of where they are. Review your medicines with your doctor. Some medicines can make you feel dizzy. This can increase your chance of falling. Ask your doctor what other things that you can do to help prevent falls. This information is not intended to replace advice given to you by your health care provider. Make sure you discuss any questions you have with your health care provider. Document Released: 06/09/2009 Document Revised: 01/19/2016 Document Reviewed: 09/17/2014 Elsevier Interactive Patient Education  2017 [employer].

## 2022-06-12 NOTE — Progress Notes (Addendum)
Subjective:   Anna [name] is a 88 y.o. female who presents for Medicare Annual (Subsequent) preventive examination.   I connected with  Anna [name] on 06/12/22 by a audio enabled telemedicine application and verified that I am speaking with the correct person using two identifiers.  Patient Location: Home  Provider Location: Home Office  I discussed the limitations of evaluation and management by telemedicine. The patient expressed understanding and agreed to proceed.  Review of Systems     Cardiac Risk Factors include: advanced age (>77mn, >>57women)     Objective:    Today's Vitals   06/12/22 1353  Weight: 135 lb (61.2 kg)  Height: '4\' 11"'$  (1.499 m)   Body mass index is 27.27 kg/m.     06/12/2022    1:57 PM 03/20/2022    2:00 PM 03/20/2022    3:39 AM 02/10/2022    2:40 PM 04/27/2021    9:58 AM 03/03/2021    2:44 PM 10/19/2020   10:14 AM  Advanced Directives  Does Patient Have a Medical Advance Directive? Yes Yes Yes Yes Yes Yes Yes  Type of AParamedicof AShongopoviLiving will HAnnabellaLiving will H[place]levilleLiving will  Healthcare Power of [place]Out of facility DNR (pink MOST or yellow form);Living will   Does patient want to make changes to medical advance directive?  No - Patient declined       Copy of [place] Chart? No - copy requested No - copy requested No - copy requested  No - copy requested      Current Medications (verified) Outpatient Encounter Medications as of 06/12/2022  Medication Sig   alendronate (FOSAMAX) 70 MG tablet Take 1 tablet (70 mg total) by mouth every 7 (seven) days. Take with a full glass of water on an empty stomach.   ascorbic acid (VITAMIN C) 500 MG tablet Take 500 mg by mouth daily.   b complex vitamins capsule Take 1 capsule by mouth daily.   cetirizine (ZYRTEC) 10 MG tablet Take 10 mg by mouth every evening.    fluticasone (FLONASE) 50 MCG/ACT nasal spray Place 1 spray into both nostrils at bedtime.   gabapentin (NEURONTIN) 300 MG capsule Take 1 capsule (300 mg total) by mouth at bedtime.   latanoprost (XALATAN) 0.005 % ophthalmic solution Place 1 drop into both eyes every evening.   Lifitegrast (XIIDRA) 5 % SOLN Place 1 drop into both eyes 2 (two) times daily.   Lutein 10 MG TABS Take 1 tablet by mouth daily.   Multiple Vitamins-Minerals (PRESERVISION AREDS 2 PO) Take 1 tablet by mouth in the morning and at bedtime.   Omega-3 1000 MG CAPS Take 1 capsule by mouth daily.   No facility-administered encounter medications on file as of 06/12/2022.    Allergies (verified) Demerol [meperidine] and Sulfa antibiotics   History: Past Medical History:  Diagnosis Date   Allergy    seasonal also with dogs and cats   Arthritis    Asthma    hx of in childhood    Bladder infection    hx of    Cancer (HClemons    breast cancer bilat has had double mastectomy    Chronic kidney disease    hx of kidney stones last one pasted 1 month ago    COVID    Fibrocystic breast disease    H/O blood clots    during delivery occurred 50 years  ago per left arm    PONV (postoperative nausea and vomiting)    pt states stays very sleepy for days following anesthesia    Past Surgical History:  Procedure Laterality Date   ABDOMINAL HYSTERECTOMY     APPENDECTOMY     BACK SURGERY     disc repaired 20 years ago    BREAST SURGERY     double mastectomy    CESAREAN SECTION     times 2   colonscopy      cyst repaired      following mammogram / cyst ruptured had to surgically repair area / left side    DILATION AND CURETTAGE OF UTERUS     EYE SURGERY     bilat cataract surgery    HAND SURGERY     left hand / joint issues with thumb    LUNG SURGERY     related to fatty tumor / left approx 12 years ago    right knee arthroscopy      TOTAL KNEE ARTHROPLASTY Right 01/05/2015   Procedure: RIGHT TOTAL KNEE ARTHROPLASTY;   Surgeon: Latanya Maudlin, MD;  Location: [facility];  Service: Orthopedics;  Laterality: Right;   History reviewed. No pertinent family history. Social History   Socioeconomic History   Marital status: Widowed    Spouse name: Not on file   Number of children: 2   Years of education: Not on file   Highest education level: Not on file  Occupational History   Not on file  Tobacco Use   Smoking status: Never   Smokeless tobacco: Never  Vaping Use   Vaping Use: Never used  Substance and Sexual Activity   Alcohol use: No   Drug use: No   Sexual activity: Not Currently  Other Topics Concern   Not on file  Social History Narrative   Right handed   Drinks caffeine   One story home      Son- Orah Sonnen, [name].   Daughter- [name]   Social Determinants of Health   Financial Resource Strain: Low Risk  (06/12/2022)   Overall Financial Resource Strain (CARDIA)    Difficulty of Paying Living Expenses: Not hard at all  Food Insecurity: No Food Insecurity (06/12/2022)   Hunger Vital Sign    Worried About Running Out of Food in the Last Year: Never true    Ran Out of Food in the Last Year: Never true  Transportation Needs: No Transportation Needs (06/12/2022)   PRAPARE - Hydrologist (Medical): No    Lack of Transportation (Non-Medical): No  Physical Activity: Inactive (06/12/2022)   Exercise Vital Sign    Days of Exercise per Week: 0 days    Minutes of Exercise per Session: 0 min  Stress: No Stress Concern Present (06/12/2022)   [place]    Feeling of Stress : Not at all  Social Connections: Moderately Isolated (06/12/2022)   Social Connection and Isolation Panel [NHANES]    Frequency of Communication with Friends and Family: More than three times a week    Frequency of Social Gatherings with Friends and Family: More than three times a week    Attends Religious Services: More than 4  times per year    Active Member of [employer] or Organizations: No    Attends Archivist Meetings: Never    Marital Status: Widowed    Tobacco Counseling Counseling given: Not Answered   Clinical  Intake:  Pre-visit preparation completed: Yes  Pain : No/denies pain     Nutritional Risks: None Diabetes: No  How often do you need to have someone help you when you read instructions, pamphlets, or other written materials from your doctor or pharmacy?: 1 - Never  Diabetic?no   Interpreter Needed?: No  Information entered by :: Jadene Pierini, LPN   Activities of Daily Living    06/12/2022    1:57 PM 03/20/2022    2:00 PM  In your present state of health, do you have any difficulty performing the following activities:  Hearing? 0 1  Vision? 0 1  Difficulty concentrating or making decisions? 0 0  Walking or climbing stairs? 0 1  Comment  uses a cane  Dressing or bathing? 0 0  Doing errands, shopping? 0 0  Preparing Food and eating ? N   Using the Toilet? N   In the past six months, have you accidently leaked urine? N   Do you have problems with loss of bowel control? N   Managing your Medications? N   Managing your Finances? N   Housekeeping or managing your Housekeeping? N     Patient Care Team: [name], MD as PCP - General (Family Medicine) Cameron Sprang, MD as Consulting Physician (Neurology) Felipa Eth, Reece Leader., MD as Referring Physician (Urology) Maggie Schwalbe., MD (Otolaryngology) Amedeo Kinsman, OD (Optometry) Jari Pigg, MD as Consulting Physician (Dermatology) Rozetta Nunnery, MD (Inactive) as Consulting Physician (Otolaryngology)  Indicate any recent Medical Services you may have received from other than [facility] providers in the past year (date may be approximate).     Assessment:   This is a routine wellness examination for Marybell.  Hearing/Vision screen Vision Screening - Comments:: Annual eye exams wear glasses    Dietary issues and exercise activities discussed: Current Exercise Habits: Home exercise routine, Type of exercise: walking, Time (Minutes): 30, Frequency (Times/Week): 3, Weekly Exercise (Minutes/Week): 90, Intensity: Mild, Exercise limited by: None identified   Goals Addressed             This Visit's Progress    Patient Stated   On track    Maintain current lifestyle       Depression Screen    06/12/2022    1:56 PM 10/09/2021    1:06 PM 04/27/2021   10:07 AM 01/05/2021    1:14 PM  PHQ 2/9 Scores  PHQ - 2 Score 0 0 0 0    Fall Risk    06/12/2022    1:53 PM 10/09/2021    1:06 PM 04/27/2021   10:01 AM 03/03/2021    2:44 PM 01/05/2021    1:14 PM  [place] in the past year? 0 0 0 0 1  Number falls in past yr: 0 0 0 0 1  Injury with Fall? 0 0 0 0 0  Risk for fall due to : No Fall Risks No Fall Risks     Follow up Falls prevention discussed Falls evaluation completed Falls evaluation completed;Falls prevention discussed      FALL RISK PREVENTION PERTAINING TO THE HOME:  Any stairs in or around the home? Yes  If so, are there any without handrails? No  Home free of loose throw rugs in walkways, pet beds, electrical cords, etc? Yes  Adequate lighting in your home to reduce risk of falls? Yes   ASSISTIVE DEVICES UTILIZED TO PREVENT FALLS:  Life alert? No  Use of  a cane, walker or w/c? Yes  Grab bars in the bathroom? Yes  Shower chair or bench in shower? Yes  Elevated toilet seat or a handicapped toilet? Yes      06/12/2022    1:57 PM  6CIT Screen  What Year? 0 points  What month? 0 points  What time? 0 points  Count back from 20 0 points  Months in reverse 0 points  Repeat phrase 0 points  Total Score 0 points    Immunizations Immunization History  Administered Date(s) Administered   PFIZER(Purple Top)SARS-COV-2 Vaccination 10/06/2019, 11/10/2019   Tdap 02/10/2022    TDAP status: Up to date  Flu Vaccine status: Due, Education has been  provided regarding the importance of this vaccine. Advised may receive this vaccine at local pharmacy or Health Dept. Aware to provide a copy of the vaccination record if obtained from local pharmacy or Health Dept. Verbalized acceptance and understanding.  Pneumococcal vaccine status: Due, Education has been provided regarding the importance of this vaccine. Advised may receive this vaccine at local pharmacy or Health Dept. Aware to provide a copy of the vaccination record if obtained from local pharmacy or Health Dept. Verbalized acceptance and understanding.  Covid-19 vaccine status: Declined, Education has been provided regarding the importance of this vaccine but patient still declined. Advised may receive this vaccine at local pharmacy or Health Dept.or vaccine clinic. Aware to provide a copy of the vaccination record if obtained from local pharmacy or Health Dept. Verbalized acceptance and understanding.  Qualifies for Shingles Vaccine? Yes   Zostavax completed Yes   Shingrix Completed?: No.    Education has been provided regarding the importance of this vaccine. Patient has been advised to call insurance company to determine out of pocket expense if they have not yet received this vaccine. Advised may also receive vaccine at local pharmacy or Health Dept. Verbalized acceptance and understanding.  Screening Tests Health Maintenance  Topic Date Due   Zoster Vaccines- Shingrix (1 of 2) Never done   Pneumonia Vaccine 60+ Years old (1 - PCV) Never done   DEXA SCAN  Never done   COVID-19 Vaccine (3 - Pfizer risk series) 12/08/2019   INFLUENZA VACCINE  Never done   TETANUS/TDAP  02/11/2032   HPV VACCINES  Aged Out    Health Maintenance  Health Maintenance Due  Topic Date Due   Zoster Vaccines- Shingrix (1 of 2) Never done   Pneumonia Vaccine 63+ Years old (1 - PCV) Never done   DEXA SCAN  Never done   COVID-19 Vaccine (3 - Pfizer risk series) 12/08/2019   INFLUENZA VACCINE  Never done     Colorectal cancer screening: No longer required.   Mammogram status: No longer required due to age.  Bone Density status: Ordered 06/12/2022. Pt provided with contact info and advised to call to schedule appt.  Lung Cancer Screening: (Low Dose CT Chest recommended if Age 72-80 years, 30 pack-year currently smoking OR have quit w/in 15years.) does not qualify.   Lung Cancer Screening Referral: n/a  Additional Screening:  Hepatitis C Screening: does not qualify;   Vision Screening: Recommended annual ophthalmology exams for early detection of glaucoma and other disorders of the eye. Is the patient up to date with their annual eye exam?  Yes  Who is the provider or what is the name of the office in which the patient attends annual eye exams? Dr.Perez  If pt is not established with a provider, would they like to be referred  to a provider to establish care? No .   Dental Screening: Recommended annual dental exams for proper oral hygiene  Community Resource Referral / Chronic Care Management: CRR required this visit?  No   CCM required this visit?  No      Plan:     I have personally reviewed and noted the following in the patient's chart:   Medical and social history Use of alcohol, tobacco or illicit drugs  Current medications and supplements including opioid prescriptions. Patient is not currently taking opioid prescriptions. Functional ability and status Nutritional status Physical activity Advanced directives List of other physicians Hospitalizations, surgeries, and ER visits in previous 12 months Vitals Screenings to include cognitive, depression, and falls Referrals and appointments  In addition, I have reviewed and discussed with patient certain preventive protocols, quality metrics, and best practice recommendations. A written personalized care plan for preventive services as well as general preventive health recommendations were provided to patient.      Daphane Shepherd, LPN   32/20/2542   Nurse Notes: Due Flu/TDAP/Pneumonia Vaccine

## 2022-07-06 ENCOUNTER — Ambulatory Visit: Payer: Medicare Other | Admitting: Family Medicine

## 2022-07-11 ENCOUNTER — Telehealth (HOSPITAL_BASED_OUTPATIENT_CLINIC_OR_DEPARTMENT_OTHER): Payer: Self-pay

## 2022-07-18 ENCOUNTER — Ambulatory Visit (INDEPENDENT_AMBULATORY_CARE_PROVIDER_SITE_OTHER): Payer: Medicare Other | Admitting: Family Medicine

## 2022-07-18 VITALS — BP 134/60 | HR 71 | Temp 98.2°F | Ht 59.0 in | Wt 137.6 lb

## 2022-07-18 DIAGNOSIS — G609 Hereditary and idiopathic neuropathy, unspecified: Secondary | ICD-10-CM

## 2022-07-18 DIAGNOSIS — B0229 Other postherpetic nervous system involvement: Secondary | ICD-10-CM | POA: Diagnosis not present

## 2022-07-18 MED ORDER — GABAPENTIN 300 MG PO CAPS: 300.0000 mg | ORAL_CAPSULE | Freq: Two times a day (BID) | ORAL | 5 refills | Status: DC

## 2022-07-18 NOTE — Progress Notes (Signed)
[facility] [address] [place] [address] Dept: [phone] Dept Fax: [phone]  Office Visit  Subjective:    Patient ID: Anna [name], female    DOB: [birth year], 88 y.o..   MRN: [id number]  Chief Complaint  Patient presents with   Follow-up    2 month f/u.   Still having some itching and little pain, but feeling better.     History of Present Illness:  Patient is in today for follow-up regarding her shingles and post-herpetic neuralgia. Anna [name] initially developed shingles in late July on her left thorax. She was treated with a course of valacyclovir and was started on gabapentin 300 mg TID for zoster-associated pain. I saw her in mid-September for follow-up. She noted the gabapentin was also helping her peripheral neuropathy pain. However, she was having dizziness. We cut her back to only taking a dose at bedtime which has helped with the dizziness.   Anna [name] does state that the PHN appears to have resolved at this point, though she has a little scaliness at the site of the original herpes outbreak and some mild itchiness at times. She continues to feel the gabapentin has helped her peripheral neuropathy. She does note occasional sharp pains in the dorsum of her left foot. In addition, she has the chronic low-grade burning pain in her feet.  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   Sensorineural hearing loss (SNHL) 08/07/2018   Idiopathic peripheral neuropathy 01/25/2016   Nephrolithiasis 03/29/2015    History of total knee arthroplasty 01/05/2015   Rosacea 07/14/2014   Degenerative joint disease of cervical and lumbar spine 07/14/2014   Microscopic hematuria 07/14/2014   Mammogram abnormal 07/14/2014   History of breast cancer in female 07/14/2014   Atrophic vaginitis 07/14/2014   Allergic rhinitis 07/14/2014   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   Vitamin D deficiency 05/18/2014   Osteoporosis 05/18/2014   Diverticulosis of large intestine without hemorrhage 05/18/2014   Past Surgical History:  Procedure Laterality Date   ABDOMINAL HYSTERECTOMY     APPENDECTOMY     BACK SURGERY     disc repaired 20 years ago    BREAST SURGERY     double mastectomy    CESAREAN SECTION     times 2   colonscopy      cyst repaired      following mammogram / cyst ruptured had to surgically repair area / left side    DILATION AND CURETTAGE OF UTERUS     EYE SURGERY     bilat cataract surgery    HAND SURGERY     left hand / joint issues with thumb    LUNG SURGERY     related to fatty tumor / left approx 12 years ago    right knee arthroscopy      TOTAL KNEE ARTHROPLASTY Right 01/05/2015   Procedure: RIGHT TOTAL KNEE ARTHROPLASTY;  Surgeon: Latanya Maudlin, MD;  Location: [facility];  Service: Orthopedics;  Laterality: Right;   No family history on file.  Outpatient Medications Prior to Visit  Medication Sig Dispense Refill  alendronate (FOSAMAX) 70 MG tablet Take 1 tablet (70 mg total) by mouth every 7 (seven) days. Take with a full glass of water on an empty stomach. 4 tablet 11   ascorbic acid (VITAMIN C) 500 MG tablet Take 500 mg by mouth daily.     b complex vitamins capsule Take 1 capsule by mouth daily.     cetirizine (ZYRTEC) 10 MG tablet Take 10 mg by mouth every evening.     fluticasone (FLONASE) 50 MCG/ACT nasal spray Place 1 spray into both nostrils at bedtime.     latanoprost (XALATAN) 0.005 % ophthalmic solution Place 1 drop into both eyes every evening.     Lifitegrast (XIIDRA) 5 % SOLN  Place 1 drop into both eyes 2 (two) times daily.     Lutein 10 MG TABS Take 1 tablet by mouth daily.     Multiple Vitamins-Minerals (PRESERVISION AREDS 2 PO) Take 1 tablet by mouth in the morning and at bedtime.     Omega-3 1000 MG CAPS Take 1 capsule by mouth daily.     gabapentin (NEURONTIN) 300 MG capsule Take 1 capsule (300 mg total) by mouth at bedtime. 30 capsule 5   No facility-administered medications prior to visit.   Allergies  Allergen Reactions   Demerol [Meperidine] Nausea And Vomiting   Sulfa Antibiotics Nausea And Vomiting     Objective:   Today's Vitals   07/18/22 1324  BP: 134/60  Pulse: 71  Temp: 98.2 F (36.8 C)  TempSrc: Temporal  SpO2: 96%  Weight: 137 lb 9.6 oz (62.4 kg)  Height: '4\' 11"'$  (1.499 m)   Body mass index is 27.79 kg/m.   General: Well developed, well nourished. No acute distress. Back: There are a few scaly patches at the site of the previous zoster outbreak. No active   vesicles seen. Psych: Alert and oriented. Normal mood and affect.  Health Maintenance Due  Topic Date Due   Zoster Vaccines- Shingrix (1 of 2) Never done   Pneumonia Vaccine 15+ Years old (1 - PCV) Never done   DEXA SCAN  Never done     Assessment & Plan:   1. Post herpetic neuralgia Resolved. We did discuss Shingrix vaccination next summer.  2. Idiopathic peripheral neuropathy I will try titrating her back up to taking the gabapentin twice a day to see if this manages the peripheral neuropathy pain better.   - gabapentin (NEURONTIN) 300 MG capsule; Take 1 capsule (300 mg total) by mouth 2 (two) times daily.  Dispense: 60 capsule; Refill: 5  Return in about 6 months (around 01/16/2023) for Reassessment.   [name], MD

## 2023-01-12 ENCOUNTER — Emergency Department (HOSPITAL_BASED_OUTPATIENT_CLINIC_OR_DEPARTMENT_OTHER)
Admission: EM | Admit: 2023-01-12 | Discharge: 2023-01-12 | Disposition: A | Discharge status: Home / Self Care | Payer: Medicare Other | Attending: Emergency Medicine | Admitting: Emergency Medicine

## 2023-01-12 ENCOUNTER — Encounter (HOSPITAL_BASED_OUTPATIENT_CLINIC_OR_DEPARTMENT_OTHER): Payer: Self-pay

## 2023-01-12 ENCOUNTER — Other Ambulatory Visit: Payer: Self-pay

## 2023-01-12 DIAGNOSIS — R300 Dysuria: Secondary | ICD-10-CM | POA: Insufficient documentation

## 2023-01-12 LAB — CBC
HCT: 43.3 % (ref 36.0–46.0)
Hemoglobin: 14.6 g/dL (ref 12.0–15.0)
MCH: 30.4 pg (ref 26.0–34.0)
MCHC: 33.7 g/dL (ref 30.0–36.0)
MCV: 90 fL (ref 80.0–100.0)
Platelets: 226 10*3/uL (ref 150–400)
RBC: 4.81 MIL/uL (ref 3.87–5.11)
RDW: 12.8 % (ref 11.5–15.5)
WBC: 6.5 10*3/uL (ref 4.0–10.5)
nRBC: 0 % (ref 0.0–0.2)

## 2023-01-12 LAB — URINALYSIS, ROUTINE W REFLEX MICROSCOPIC
Bilirubin Urine: NEGATIVE
Glucose, UA: NEGATIVE mg/dL
Hgb urine dipstick: NEGATIVE
Ketones, ur: NEGATIVE mg/dL
Nitrite: NEGATIVE
Protein, ur: NEGATIVE mg/dL
Specific Gravity, Urine: 1.01 (ref 1.005–1.030)
pH: 7 (ref 5.0–8.0)

## 2023-01-12 LAB — URINALYSIS, MICROSCOPIC (REFLEX)

## 2023-01-12 LAB — BASIC METABOLIC PANEL
Anion gap: 10 (ref 5–15)
BUN: 13 mg/dL (ref 8–23)
CO2: 24 mmol/L (ref 22–32)
Calcium: 9.4 mg/dL (ref 8.9–10.3)
Chloride: 104 mmol/L (ref 98–111)
Creatinine, Ser: 0.69 mg/dL (ref 0.44–1.00)
GFR, Estimated: >60 mL/min (ref >60)
Glucose, Bld: 104 mg/dL — ABNORMAL HIGH (ref 70–99)
Potassium: 4.2 mmol/L (ref 3.5–5.1)
Sodium: 138 mmol/L (ref 135–145)

## 2023-01-12 MED ORDER — CEPHALEXIN 500 MG PO CAPS
ORAL_CAPSULE | ORAL | 0 refills | Status: AC
Start: 2023-01-12 — End: ?

## 2023-01-12 MED ORDER — SODIUM CHLORIDE 0.9 % IV SOLN
1.0000 g | Freq: Once | INTRAVENOUS | Status: AC
  Administered 2023-01-12: 1 g via INTRAVENOUS
  Filled 2023-01-12: qty 10

## 2023-01-12 MED ORDER — SODIUM CHLORIDE 0.9 % IV SOLN: INTRAVENOUS | Status: DC | PRN

## 2023-01-12 NOTE — Discharge Instructions (Addendum)
Contact a health care provider if: °You have a fever. °You develop pain in your back or sides. °You have nausea or vomiting. °You have blood in your urine. °You are not urinating as often as you usually do. °Get help right away if: °Your pain is severe and not relieved with medicines. °You cannot eat or drink without vomiting. °You are confused. °You have a rapid heartbeat while resting. °You have shaking or chills. °You feel extremely weak. °

## 2023-01-12 NOTE — ED Triage Notes (Signed)
Patient stated she has burning when urinating and frequency . She denied fever. She also stated she started taking some antibiotics she had left over from a dental problem.

## 2023-01-12 NOTE — ED Provider Notes (Signed)
[place] EMERGENCY DEPARTMENT AT [facility] Provider Note   CSN: [id number] Arrival date & time: 01/12/23  1820     History {Add pertinent medical, surgical, social history, OB history to HPI:1} Chief Complaint  Patient presents with   Dysuria    Anna [name] is a 89 y.o. female.   Dysuria Pain quality:  Burning Pain severity:  Moderate Onset quality:  Gradual Duration:  1 day Timing:  Constant Progression:  Worsening Chronicity:  New Relieved by:  Nothing Ineffective treatments:  None tried Urinary symptoms: discolored urine, foul-smelling urine and frequent urination   Associated symptoms: no abdominal pain and no fever   Risk factors: hx of urolithiasis        Home Medications Prior to Admission medications   Medication Sig Start Date End Date Taking? Authorizing Provider  alendronate (FOSAMAX) 70 MG tablet Take 1 tablet (70 mg total) by mouth every 7 (seven) days. Take with a full glass of water on an empty stomach. 10/09/21   Loyola Mast, MD  ascorbic acid (VITAMIN C) 500 MG tablet Take 500 mg by mouth daily.    [provider]  b complex vitamins capsule Take 1 capsule by mouth daily.    [provider]  cetirizine (ZYRTEC) 10 MG tablet Take 10 mg by mouth every evening.    [provider]  fluticasone (FLONASE) 50 MCG/ACT nasal spray Place 1 spray into both nostrils at bedtime.    [provider]  gabapentin (NEURONTIN) 300 MG capsule Take 1 capsule (300 mg total) by mouth 2 (two) times daily. 07/18/22   Loyola Mast, MD  latanoprost (XALATAN) 0.005 % ophthalmic solution Place 1 drop into both eyes every evening. 02/14/22   [provider]  Lifitegrast Benay Spice) 5 % SOLN Place 1 drop into both eyes 2 (two) times daily.    [provider]  Lutein 10 MG TABS Take 1 tablet by mouth daily.    [provider]  Multiple Vitamins-Minerals (PRESERVISION AREDS 2 PO) Take 1 tablet by mouth  in the morning and at bedtime.    [provider]  Omega-3 1000 MG CAPS Take 1 capsule by mouth daily.    [provider]      Allergies    Demerol [meperidine] and Sulfa antibiotics    Review of Systems   Review of Systems  Constitutional:  Negative for fever.  Gastrointestinal:  Negative for abdominal pain.  Genitourinary:  Positive for dysuria.    Physical Exam Updated Vital Signs BP (!) 157/80 (BP Location: Left Arm)   Pulse (!) 102   Temp 97.9 F (36.6 C) (Oral)   Resp 18   Ht 4\' 11"  (1.499 m)   Wt 59 kg   SpO2 97%   BMI 26.26 kg/m  Physical Exam Vitals and nursing note reviewed.  Constitutional:      General: She is not in acute distress.    Appearance: She is well-developed. She is not diaphoretic.  HENT:     Head: Normocephalic and atraumatic.     Right Ear: External ear normal.     Left Ear: External ear normal.     Nose: Nose normal.     Mouth/Throat:     Mouth: Mucous membranes are moist.  Eyes:     General: No scleral icterus.    Conjunctiva/sclera: Conjunctivae normal.  Cardiovascular:     Rate and Rhythm: Normal rate and regular rhythm.     Heart sounds: Normal heart  sounds. No murmur heard.    No friction rub. No gallop.  Pulmonary:     Effort: Pulmonary effort is normal. No respiratory distress.     Breath sounds: Normal breath sounds.  Abdominal:     General: Bowel sounds are normal. There is no distension.     Palpations: Abdomen is soft. There is no mass.     Tenderness: There is no abdominal tenderness. There is no guarding.  Musculoskeletal:     Cervical back: Normal range of motion.  Skin:    General: Skin is warm and dry.  Neurological:     Mental Status: She is alert and oriented to person, place, and time.  Psychiatric:        Behavior: Behavior normal.     ED Results / Procedures / Treatments   Labs (all labs ordered are listed, but only abnormal results are displayed) Labs Reviewed  URINALYSIS, ROUTINE W  REFLEX MICROSCOPIC - Abnormal; Notable for the following components:      Result Value   Leukocytes,Ua TRACE (*)    All other components within normal limits  URINALYSIS, MICROSCOPIC (REFLEX) - Abnormal; Notable for the following components:   Bacteria, UA FEW (*)    All other components within normal limits  CBC  BASIC METABOLIC PANEL    EKG None  Radiology No results found.  Procedures Procedures  {Document cardiac monitor, telemetry assessment procedure when appropriate:1}  Medications Ordered in ED Medications  cefTRIAXone (ROCEPHIN) 1 g in sodium chloride 0.9 % 100 mL IVPB (has no administration in time range)    ED Course/ Medical Decision Making/ A&P   {   Click here for ABCD2, HEART and other calculatorsREFRESH Note before signing :1}                          Medical Decision Making Amount and/or Complexity of Data Reviewed Labs: ordered.   ***  {Document critical care time when appropriate:1} {Document review of labs and clinical decision tools ie heart score, Chads2Vasc2 etc:1}  {Document your independent review of radiology images, and any outside records:1} {Document your discussion with family members, caretakers, and with consultants:1} {Document social determinants of health affecting pt's care:1} {Document your decision making why or why not admission, treatments were needed:1} Final Clinical Impression(s) / ED Diagnoses Final diagnoses:  None    Rx / DC Orders ED Discharge Orders     None

## 2023-01-14 ENCOUNTER — Telehealth: Payer: Self-pay | Admitting: Family Medicine

## 2023-01-14 NOTE — Telephone Encounter (Signed)
Pt wants to know if she should keep her appt on Wednesday she is feeling better after going to [facility].

## 2023-01-14 NOTE — Telephone Encounter (Signed)
Spoke to patient and she states she is feeling good.  [facility].  Does she need to keep this appointment?  Please review and advise.   Thanks.  Dm/cma

## 2023-01-15 NOTE — Telephone Encounter (Signed)
Noted. Dm/cma  

## 2023-01-15 NOTE — Telephone Encounter (Signed)
Lft VM that appointment was actually for a 6 month f/u not just the urine issue.  Advised to call us back if she wants to cancel or has questions.  Dm/cma

## 2023-01-15 NOTE — Telephone Encounter (Signed)
Pt heard vm and is coming to her appt.

## 2023-01-16 ENCOUNTER — Encounter: Payer: Self-pay | Admitting: Family Medicine

## 2023-01-16 ENCOUNTER — Ambulatory Visit (INDEPENDENT_AMBULATORY_CARE_PROVIDER_SITE_OTHER): Payer: Medicare Other | Admitting: Family Medicine

## 2023-01-16 VITALS — BP 122/66 | HR 100 | Temp 98.6°F | Ht 59.0 in | Wt 134.0 lb

## 2023-01-16 DIAGNOSIS — L821 Other seborrheic keratosis: Secondary | ICD-10-CM

## 2023-01-16 DIAGNOSIS — G609 Hereditary and idiopathic neuropathy, unspecified: Secondary | ICD-10-CM

## 2023-01-16 DIAGNOSIS — B0229 Other postherpetic nervous system involvement: Secondary | ICD-10-CM | POA: Diagnosis not present

## 2023-01-16 DIAGNOSIS — R5381 Other malaise: Secondary | ICD-10-CM | POA: Insufficient documentation

## 2023-01-16 DIAGNOSIS — M419 Scoliosis, unspecified: Secondary | ICD-10-CM

## 2023-01-16 NOTE — Progress Notes (Signed)
[facility] [address] [place] [address] Dept: [phone] Dept Fax: [phone]  Chronic Care Office Visit  Subjective:    Patient ID: Anna [name], female    DOB: [birth year], 89 y.o..   MRN: [id number]  Chief Complaint  Patient presents with   Medical Management of Chronic Issues    6 month f/u.  Feels more febrile.    History of Present Illness:  Patient is in today for reassessment of chronic medical issues.  Ms. Murrell was seen recently at [facility] with a urinary tract infection. She notes that she is feeling better at present.   Ms. Mahadevan has a history of seborrheic keratoses. She notes she continues to develop new lesions. She does see a dermatologist to manage this and skin cancers she has had. She worries abut how bad her skin is, She wonders what she can do to stop getting SKs.  Ms. Obrion has a history of idiopathic peripheral neuropathy. She also had shingles with post-herpetic neuralgia this past year. She continues to use gabapentin, but only at bedtime, as it was making her too drowsy to use during the day.She is using some topical Voltaren gel and finds this helps with the lower leg burning.  Ms. Acob notes a general sense of debility/feebleness. She finds this concerning related to maintaining her independence. She feels she does get adequate sleep. She eats regular meals and does not have issues with money for food. She does admit that she was never one to exercise very much. she has been less active with time. She alos has had some kyphoscoliosis and notes that not being able to stand up straight does throw off her balance and stance.  Past Medical History: Patient Active Problem List   Diagnosis Date Noted   Post herpetic neuralgia 05/07/2022   Seborrheic keratoses 02/14/2022   History of basal cell carcinoma (BCC) 10/09/2021   Basal cell carcinoma (BCC) of dorsum of nose 10/09/2021    Osteoarthritis of multiple joints 01/05/2021   Chronic bilateral low back pain without sciatica 12/07/2019   Idiopathic stabbing headache 11/02/2019   Sensorineural hearing loss (SNHL) 08/07/2018   Idiopathic peripheral neuropathy 01/25/2016   Nephrolithiasis 03/29/2015   History of total knee arthroplasty 01/05/2015   Rosacea 07/14/2014   Degenerative joint disease of cervical and lumbar spine 07/14/2014   Microscopic hematuria 07/14/2014   Mammogram abnormal 07/14/2014   History of breast cancer in female 07/14/2014   Atrophic vaginitis 07/14/2014   Allergic rhinitis 07/14/2014   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   Vitamin D deficiency 05/18/2014   Osteoporosis 05/18/2014   Diverticulosis of large intestine without hemorrhage 05/18/2014   Past Surgical History:  Procedure Laterality Date   ABDOMINAL HYSTERECTOMY     APPENDECTOMY     BACK SURGERY     disc repaired 20 years ago    BREAST SURGERY     double mastectomy    CESAREAN SECTION     times 2   colonscopy      cyst repaired      following mammogram / cyst ruptured had to surgically repair area / left side    DILATION AND CURETTAGE OF UTERUS     EYE SURGERY     bilat cataract surgery    HAND SURGERY     left hand / joint issues with thumb    LUNG SURGERY     related to fatty tumor / left approx 12 years ago    right knee  arthroscopy      TOTAL KNEE ARTHROPLASTY Right 01/05/2015   Procedure: RIGHT TOTAL KNEE ARTHROPLASTY;  Surgeon: Ranee Gosselin, MD;  Location: [facility];  Service: Orthopedics;  Laterality: Right;   History reviewed. No pertinent family history. Outpatient Medications Prior to Visit  Medication Sig Dispense Refill   ascorbic acid (VITAMIN C) 500 MG tablet Take 500 mg by mouth daily.     b complex vitamins capsule Take 1 capsule by mouth daily.     cephALEXin (KEFLEX) 500 MG capsule 2 caps po bid x 7 days 28 capsule 0   cetirizine (ZYRTEC) 10 MG tablet Take 10 mg by mouth every evening.     gabapentin  (NEURONTIN) 300 MG capsule Take 1 capsule (300 mg total) by mouth 2 (two) times daily. 60 capsule 5   latanoprost (XALATAN) 0.005 % ophthalmic solution Place 1 drop into both eyes every evening.     Lifitegrast (XIIDRA) 5 % SOLN Place 1 drop into both eyes 2 (two) times daily.     Lutein 10 MG TABS Take 1 tablet by mouth daily.     Multiple Vitamins-Minerals (PRESERVISION AREDS 2 PO) Take 1 tablet by mouth in the morning and at bedtime.     Omega-3 1000 MG CAPS Take 1 capsule by mouth daily.     triamcinolone (NASACORT ALLERGY 24HR) 55 MCG/ACT AERO nasal inhaler Place 2 sprays into the nose daily.     alendronate (FOSAMAX) 70 MG tablet Take 1 tablet (70 mg total) by mouth every 7 (seven) days. Take with a full glass of water on an empty stomach. (Patient not taking: Reported on 01/16/2023) 4 tablet 11   fluticasone (FLONASE) 50 MCG/ACT nasal spray Place 1 spray into both nostrils at bedtime. (Patient not taking: Reported on 01/16/2023)     No facility-administered medications prior to visit.   Allergies  Allergen Reactions   Demerol [Meperidine] Nausea And Vomiting   Sulfa Antibiotics Nausea And Vomiting   Objective:   Today's Vitals   01/16/23 1348  BP: 122/66  Pulse: 100  Temp: 98.6 F (37 C)  TempSrc: Temporal  SpO2: 97%  Weight: 134 lb (60.8 kg)  Height: 4\' 11"  (1.499 m)   Body mass index is 27.06 kg/m.   General: Well developed, well nourished. No acute distress. HEENT: Normocephalic, non-traumatic. External ears normal. EAC and TMs normal bilaterally. PERRL, EOMI. Conjunctiva clear. Nose   clear without congestion or rhinorrhea. Mucous membranes moist. Oropharynx clear. Good dentition. Neck: Supple. No lymphadenopathy. No thyromegaly. Lungs: Clear to auscultation bilaterally. No wheezing, rales or rhonchi. CV: RRR without murmurs or rubs. Pulses 2+ bilaterally. Abdomen: Soft, non-tender. Bowel sounds positive, normal pitch and frequency. No hepatosplenomegaly. No rebound or  guarding. Back: Straight. No CVA tenderness bilaterally. Extremities: Full ROM. No joint swelling or tenderness. No edema noted. Skin: Warm and dry. No rashes. Neuro: CN II-XII intact. Normal sensation and DTR bilaterally. Psych: Alert and oriented. Normal mood and affect.  Health Maintenance Due  Topic Date Due   Pneumonia Vaccine 22+ Years old (1 of 1 - PCV) Never done   DEXA SCAN  Never done     Lab Results:    Latest Ref Rng & Units 01/12/2023   10:00 PM 03/21/2022    1:59 AM 03/20/2022    3:38 AM  CMP  Glucose 70 - 99 mg/dL 782  956  213   BUN 8 - 23 mg/dL 13  15  12    Creatinine 0.44 - 1.00 mg/dL 0.86  0.78  0.65   Sodium 135 - 145 mmol/L 138  139  141   Potassium 3.5 - 5.1 mmol/L 4.2  3.8  3.6   Chloride 98 - 111 mmol/L 104  108  109   CO2 22 - 32 mmol/L 24  24  25    Calcium 8.9 - 10.3 mg/dL 9.4  9.0  9.1       Latest Ref Rng & Units 01/12/2023   10:00 PM 03/21/2022    1:59 AM 03/20/2022    3:38 AM  CBC  WBC 4.0 - 10.5 K/uL 6.5  7.6  6.0   Hemoglobin 12.0 - 15.0 g/dL 81.1  91.4  78.2   Hematocrit 36.0 - 46.0 % 43.3  37.8  41.4   Platelets 150 - 400 K/uL 226  236  233     Assessment & Plan:   Problem List Items Addressed This Visit       Nervous and Auditory   Idiopathic peripheral neuropathy    Stable. Continue gabapentin 300 mg qhs and Voltaren gel topically.      Post herpetic neuralgia - Primary    Stable. Occasional itching sensation. Continue gabapentin 300 mg qhs.        Musculoskeletal and Integument   Seborrheic keratoses    She continues to have multiple Sks. I explained that there is no medicine to prevent these from occurring. She will continue to see her dermatologist to have these frozen periodically for lesions that are bothersome.      Kyphoscoliosis    Ms. Cullipher does have some kyphosis that impacts her posture and balance. She has difficulty with getting out of the home on her own to go to appointments. I will request Home Health to see  her to do some physical therapy, hoping to improve her posture and balance and help her maintain independence.      Relevant Orders   Ambulatory referral to Home Health     Other   Physical deconditioning    Ms. Surratt is havign gradual progressive decline in muscle strength, impacting her ambulation and ability to care for her needs. She has difficulty with getting out of the home on her own to go to appointments. I will request Home Health to see her to do some physical therapy, hoping to improve her posture and balance and help her maintain independence.      Relevant Orders   Ambulatory referral to Home Health    Return in about 6 months (around 07/19/2023) for Reassessment.   Loyola Mast, MD

## 2023-01-16 NOTE — Assessment & Plan Note (Signed)
She continues to have multiple Sks. I explained that there is no medicine to prevent these from occurring. She will continue to see her dermatologist to have these frozen periodically for lesions that are bothersome.

## 2023-01-16 NOTE — Assessment & Plan Note (Deleted)
Anna [name] does have some kyphosis that impacts her posture and balance. She has difficulty with getting out of the home on her own to go to appointments. I will request Home Health to see her to do some physical therapy, hoping to improve her posture and balance and help her maintain independence.

## 2023-01-16 NOTE — Assessment & Plan Note (Signed)
Stable. Occasional itching sensation. Continue gabapentin 300 mg qhs.

## 2023-01-16 NOTE — Assessment & Plan Note (Signed)
Ms. Buckmaster is havign gradual progressive decline in muscle strength, impacting her ambulation and ability to care for her needs. She has difficulty with getting out of the home on her own to go to appointments. I will request Home Health to see her to do some physical therapy, hoping to improve her posture and balance and help her maintain independence.

## 2023-01-16 NOTE — Assessment & Plan Note (Signed)
Stable. Continue gabapentin 300 mg qhs and Voltaren gel topically.

## 2023-01-16 NOTE — Assessment & Plan Note (Signed)
Anna [name] does have some kyphosis that impacts her posture and balance. She has difficulty with getting out of the home on her own to go to appointments. I will request Home Health to see her to do some physical therapy, hoping to improve her posture and balance and help her maintain independence. 

## 2023-01-24 ENCOUNTER — Telehealth: Payer: Self-pay | Admitting: Family Medicine

## 2023-01-24 NOTE — Telephone Encounter (Signed)
Pt is needing a cb concerning her PT. She says Dr Veto Kemps has lined up for her to get PT and they have not come out yet, it's been over  a week.

## 2023-01-25 NOTE — Telephone Encounter (Signed)
Patient notified and advised if not notified by next week to reach back out to [place]. Dm/cma

## 2023-01-28 DIAGNOSIS — B0229 Other postherpetic nervous system involvement: Secondary | ICD-10-CM | POA: Diagnosis not present

## 2023-01-28 DIAGNOSIS — M419 Scoliosis, unspecified: Secondary | ICD-10-CM | POA: Diagnosis not present

## 2023-01-28 DIAGNOSIS — M5442 Lumbago with sciatica, left side: Secondary | ICD-10-CM | POA: Diagnosis not present

## 2023-01-28 DIAGNOSIS — Z85828 Personal history of other malignant neoplasm of skin: Secondary | ICD-10-CM | POA: Diagnosis not present

## 2023-01-28 DIAGNOSIS — M5030 Other cervical disc degeneration, unspecified cervical region: Secondary | ICD-10-CM | POA: Diagnosis not present

## 2023-01-28 DIAGNOSIS — Z79899 Other long term (current) drug therapy: Secondary | ICD-10-CM | POA: Diagnosis not present

## 2023-01-28 DIAGNOSIS — Z96651 Presence of right artificial knee joint: Secondary | ICD-10-CM | POA: Diagnosis not present

## 2023-01-28 DIAGNOSIS — M5441 Lumbago with sciatica, right side: Secondary | ICD-10-CM | POA: Diagnosis not present

## 2023-01-28 DIAGNOSIS — Z87440 Personal history of urinary (tract) infections: Secondary | ICD-10-CM | POA: Diagnosis not present

## 2023-01-28 DIAGNOSIS — Z853 Personal history of malignant neoplasm of breast: Secondary | ICD-10-CM | POA: Diagnosis not present

## 2023-01-28 DIAGNOSIS — M810 Age-related osteoporosis without current pathological fracture: Secondary | ICD-10-CM | POA: Diagnosis not present

## 2023-01-28 DIAGNOSIS — Z9013 Acquired absence of bilateral breasts and nipples: Secondary | ICD-10-CM | POA: Diagnosis not present

## 2023-01-28 DIAGNOSIS — J309 Allergic rhinitis, unspecified: Secondary | ICD-10-CM | POA: Diagnosis not present

## 2023-01-28 DIAGNOSIS — L820 Inflamed seborrheic keratosis: Secondary | ICD-10-CM | POA: Diagnosis not present

## 2023-01-28 DIAGNOSIS — Z9181 History of falling: Secondary | ICD-10-CM | POA: Diagnosis not present

## 2023-01-28 DIAGNOSIS — K5730 Diverticulosis of large intestine without perforation or abscess without bleeding: Secondary | ICD-10-CM | POA: Diagnosis not present

## 2023-01-28 DIAGNOSIS — M5136 Other intervertebral disc degeneration, lumbar region: Secondary | ICD-10-CM | POA: Diagnosis not present

## 2023-01-28 DIAGNOSIS — H903 Sensorineural hearing loss, bilateral: Secondary | ICD-10-CM | POA: Diagnosis not present

## 2023-01-28 DIAGNOSIS — E559 Vitamin D deficiency, unspecified: Secondary | ICD-10-CM | POA: Diagnosis not present

## 2023-01-28 DIAGNOSIS — Z9049 Acquired absence of other specified parts of digestive tract: Secondary | ICD-10-CM | POA: Diagnosis not present

## 2023-01-28 DIAGNOSIS — G4485 Primary stabbing headache: Secondary | ICD-10-CM | POA: Diagnosis not present

## 2023-01-31 ENCOUNTER — Telehealth: Payer: Self-pay | Admitting: Family Medicine

## 2023-01-31 NOTE — Telephone Encounter (Signed)
Phone # incorrect so unable to give verbal okay.    Can you check the number? Thanks. Dm/cma

## 2023-01-31 NOTE — Telephone Encounter (Signed)
Spoke to [place] with home health, gave verbal okay for PT and they will be sending over an order for a Youth walker for balance issues to be signed and faxed to [employer].  Dm/cma

## 2023-01-31 NOTE — Telephone Encounter (Signed)
Lft VM to rtn call to clarify information.  Dm/cma ? ?

## 2023-01-31 NOTE — Telephone Encounter (Signed)
Sorry [phone]

## 2023-01-31 NOTE — Telephone Encounter (Signed)
HH ORDERS   Caller Name: ? Home Health Agency Name: [facility] Callback Phone #: [phone]  Service Requested: PT  Frequency of Visits: 2xWx1 and 1xWx8  Ordered a rolling walker Can leave VM

## 2023-02-01 DIAGNOSIS — L820 Inflamed seborrheic keratosis: Secondary | ICD-10-CM | POA: Diagnosis not present

## 2023-02-01 DIAGNOSIS — B0229 Other postherpetic nervous system involvement: Secondary | ICD-10-CM | POA: Diagnosis not present

## 2023-02-01 DIAGNOSIS — Z85828 Personal history of other malignant neoplasm of skin: Secondary | ICD-10-CM | POA: Diagnosis not present

## 2023-02-01 DIAGNOSIS — Z9181 History of falling: Secondary | ICD-10-CM | POA: Diagnosis not present

## 2023-02-01 DIAGNOSIS — Z87440 Personal history of urinary (tract) infections: Secondary | ICD-10-CM | POA: Diagnosis not present

## 2023-02-01 DIAGNOSIS — J309 Allergic rhinitis, unspecified: Secondary | ICD-10-CM | POA: Diagnosis not present

## 2023-02-01 DIAGNOSIS — M5442 Lumbago with sciatica, left side: Secondary | ICD-10-CM | POA: Diagnosis not present

## 2023-02-01 DIAGNOSIS — E559 Vitamin D deficiency, unspecified: Secondary | ICD-10-CM | POA: Diagnosis not present

## 2023-02-01 DIAGNOSIS — Z96651 Presence of right artificial knee joint: Secondary | ICD-10-CM | POA: Diagnosis not present

## 2023-02-01 DIAGNOSIS — Z9013 Acquired absence of bilateral breasts and nipples: Secondary | ICD-10-CM | POA: Diagnosis not present

## 2023-02-01 DIAGNOSIS — H903 Sensorineural hearing loss, bilateral: Secondary | ICD-10-CM | POA: Diagnosis not present

## 2023-02-01 DIAGNOSIS — M5030 Other cervical disc degeneration, unspecified cervical region: Secondary | ICD-10-CM | POA: Diagnosis not present

## 2023-02-01 DIAGNOSIS — Z9049 Acquired absence of other specified parts of digestive tract: Secondary | ICD-10-CM | POA: Diagnosis not present

## 2023-02-01 DIAGNOSIS — M5136 Other intervertebral disc degeneration, lumbar region: Secondary | ICD-10-CM | POA: Diagnosis not present

## 2023-02-01 DIAGNOSIS — Z853 Personal history of malignant neoplasm of breast: Secondary | ICD-10-CM | POA: Diagnosis not present

## 2023-02-01 DIAGNOSIS — G609 Hereditary and idiopathic neuropathy, unspecified: Secondary | ICD-10-CM | POA: Diagnosis not present

## 2023-02-01 DIAGNOSIS — K5730 Diverticulosis of large intestine without perforation or abscess without bleeding: Secondary | ICD-10-CM | POA: Diagnosis not present

## 2023-02-01 DIAGNOSIS — G4485 Primary stabbing headache: Secondary | ICD-10-CM | POA: Diagnosis not present

## 2023-02-01 DIAGNOSIS — M5441 Lumbago with sciatica, right side: Secondary | ICD-10-CM | POA: Diagnosis not present

## 2023-02-01 DIAGNOSIS — Z79899 Other long term (current) drug therapy: Secondary | ICD-10-CM | POA: Diagnosis not present

## 2023-02-01 DIAGNOSIS — M419 Scoliosis, unspecified: Secondary | ICD-10-CM | POA: Diagnosis not present

## 2023-02-01 DIAGNOSIS — M810 Age-related osteoporosis without current pathological fracture: Secondary | ICD-10-CM | POA: Diagnosis not present

## 2023-02-06 DIAGNOSIS — Z9181 History of falling: Secondary | ICD-10-CM | POA: Diagnosis not present

## 2023-02-06 DIAGNOSIS — Z85828 Personal history of other malignant neoplasm of skin: Secondary | ICD-10-CM | POA: Diagnosis not present

## 2023-02-06 DIAGNOSIS — J309 Allergic rhinitis, unspecified: Secondary | ICD-10-CM | POA: Diagnosis not present

## 2023-02-06 DIAGNOSIS — B0229 Other postherpetic nervous system involvement: Secondary | ICD-10-CM | POA: Diagnosis not present

## 2023-02-06 DIAGNOSIS — Z87440 Personal history of urinary (tract) infections: Secondary | ICD-10-CM | POA: Diagnosis not present

## 2023-02-06 DIAGNOSIS — M5441 Lumbago with sciatica, right side: Secondary | ICD-10-CM | POA: Diagnosis not present

## 2023-02-06 DIAGNOSIS — Z9013 Acquired absence of bilateral breasts and nipples: Secondary | ICD-10-CM | POA: Diagnosis not present

## 2023-02-06 DIAGNOSIS — Z853 Personal history of malignant neoplasm of breast: Secondary | ICD-10-CM | POA: Diagnosis not present

## 2023-02-06 DIAGNOSIS — H903 Sensorineural hearing loss, bilateral: Secondary | ICD-10-CM | POA: Diagnosis not present

## 2023-02-06 DIAGNOSIS — Z79899 Other long term (current) drug therapy: Secondary | ICD-10-CM | POA: Diagnosis not present

## 2023-02-06 DIAGNOSIS — M419 Scoliosis, unspecified: Secondary | ICD-10-CM | POA: Diagnosis not present

## 2023-02-06 DIAGNOSIS — M5442 Lumbago with sciatica, left side: Secondary | ICD-10-CM | POA: Diagnosis not present

## 2023-02-06 DIAGNOSIS — Z9049 Acquired absence of other specified parts of digestive tract: Secondary | ICD-10-CM | POA: Diagnosis not present

## 2023-02-06 DIAGNOSIS — E559 Vitamin D deficiency, unspecified: Secondary | ICD-10-CM | POA: Diagnosis not present

## 2023-02-06 DIAGNOSIS — G4485 Primary stabbing headache: Secondary | ICD-10-CM | POA: Diagnosis not present

## 2023-02-06 DIAGNOSIS — M5030 Other cervical disc degeneration, unspecified cervical region: Secondary | ICD-10-CM | POA: Diagnosis not present

## 2023-02-06 DIAGNOSIS — M810 Age-related osteoporosis without current pathological fracture: Secondary | ICD-10-CM | POA: Diagnosis not present

## 2023-02-06 DIAGNOSIS — K5730 Diverticulosis of large intestine without perforation or abscess without bleeding: Secondary | ICD-10-CM | POA: Diagnosis not present

## 2023-02-06 DIAGNOSIS — L820 Inflamed seborrheic keratosis: Secondary | ICD-10-CM | POA: Diagnosis not present

## 2023-02-06 DIAGNOSIS — M5136 Other intervertebral disc degeneration, lumbar region: Secondary | ICD-10-CM | POA: Diagnosis not present

## 2023-02-06 DIAGNOSIS — Z96651 Presence of right artificial knee joint: Secondary | ICD-10-CM | POA: Diagnosis not present

## 2023-02-07 IMAGING — DX DG TIBIA/FIBULA 2V*L*
2 series · 2 of 2 positions shown · non-contrast
Comparison: 04/30/2019

CLINICAL DATA: Acute LEFT LOWER leg pain following fall. Initial
encounter.

EXAM:
LEFT TIBIA AND FIBULA - 2 VIEW

[tibia ap]
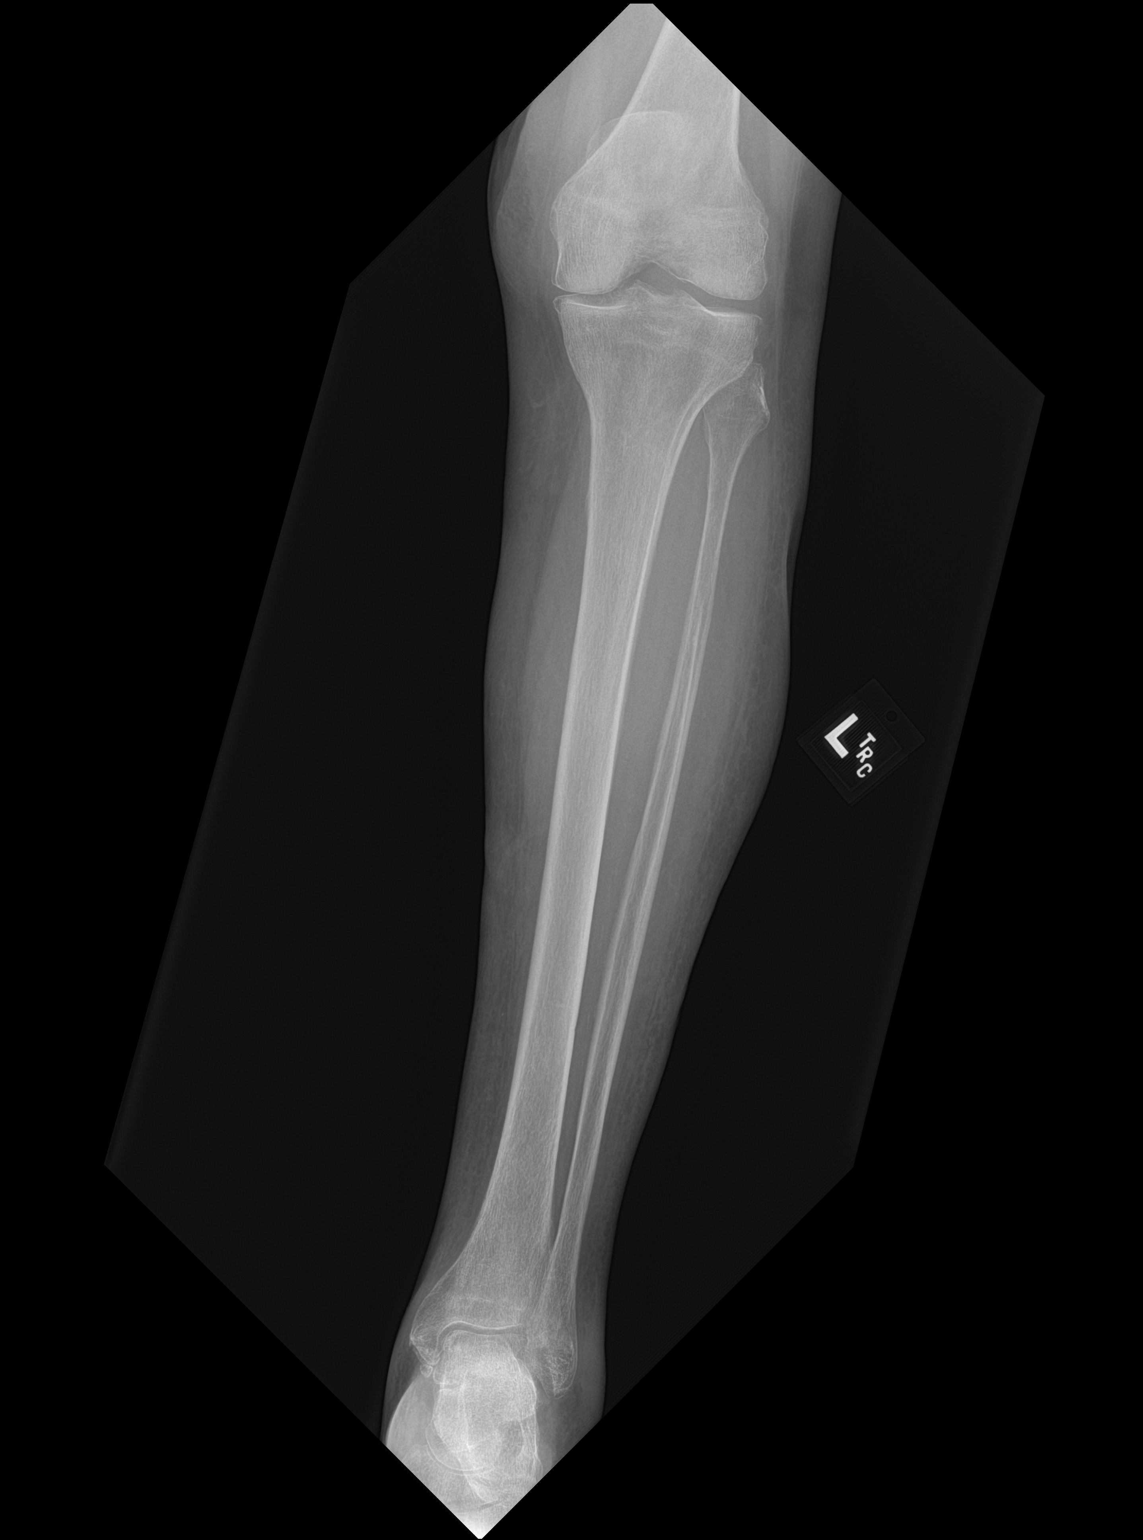

[tibia lat]
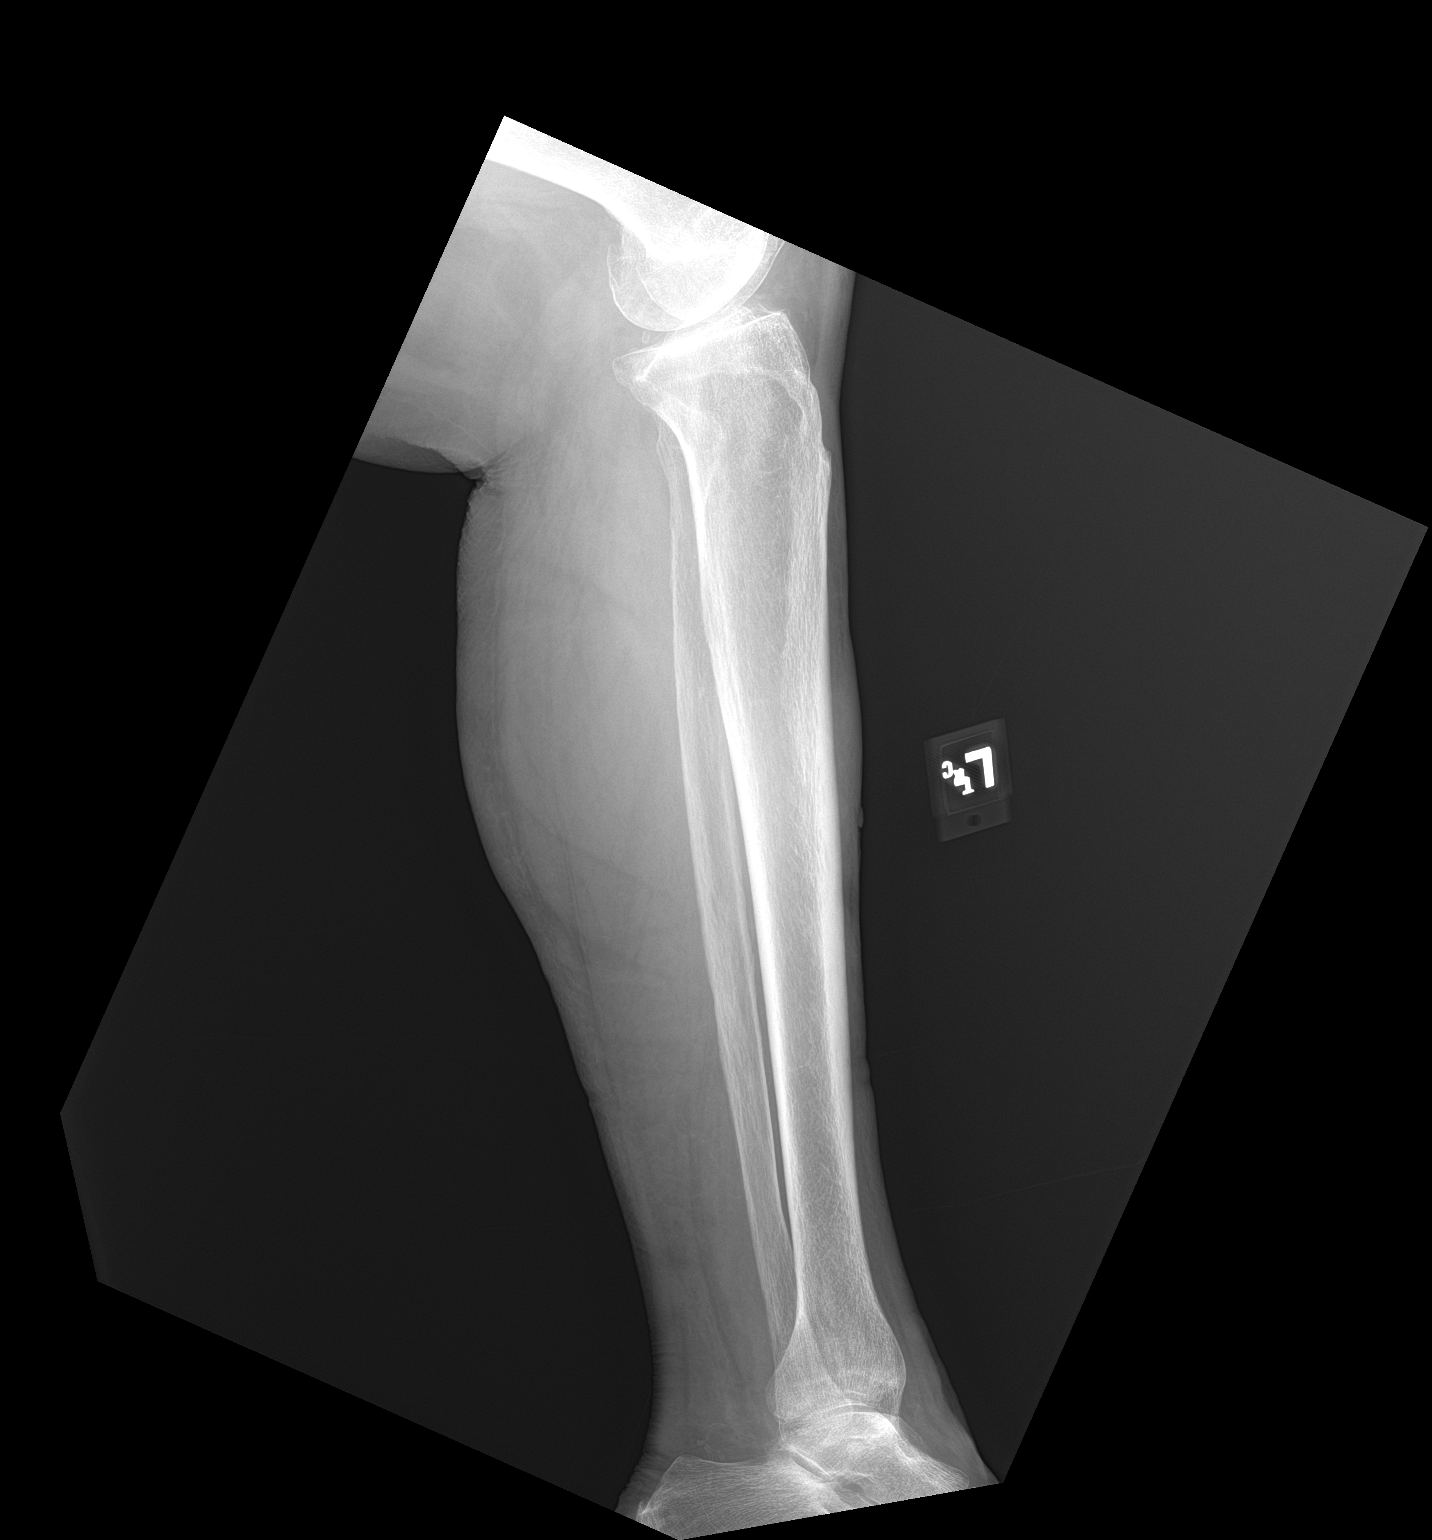

[2 of 2 positions shown; findings below may reference images not displayed]

FINDINGS: No acute fracture, subluxation or dislocation identified.

Remote MEDIAL malleolar fracture again noted.

No suspicious focal bony lesions are present.
IMPRESSION: No acute abnormality.

## 2023-02-13 DIAGNOSIS — M5441 Lumbago with sciatica, right side: Secondary | ICD-10-CM | POA: Diagnosis not present

## 2023-02-13 DIAGNOSIS — M419 Scoliosis, unspecified: Secondary | ICD-10-CM | POA: Diagnosis not present

## 2023-02-13 DIAGNOSIS — B0229 Other postherpetic nervous system involvement: Secondary | ICD-10-CM | POA: Diagnosis not present

## 2023-02-13 DIAGNOSIS — M5442 Lumbago with sciatica, left side: Secondary | ICD-10-CM | POA: Diagnosis not present

## 2023-02-14 DIAGNOSIS — Z85828 Personal history of other malignant neoplasm of skin: Secondary | ICD-10-CM | POA: Diagnosis not present

## 2023-02-14 DIAGNOSIS — L820 Inflamed seborrheic keratosis: Secondary | ICD-10-CM | POA: Diagnosis not present

## 2023-02-14 DIAGNOSIS — Z79899 Other long term (current) drug therapy: Secondary | ICD-10-CM | POA: Diagnosis not present

## 2023-02-14 DIAGNOSIS — M5442 Lumbago with sciatica, left side: Secondary | ICD-10-CM | POA: Diagnosis not present

## 2023-02-14 DIAGNOSIS — Z9181 History of falling: Secondary | ICD-10-CM | POA: Diagnosis not present

## 2023-02-14 DIAGNOSIS — H903 Sensorineural hearing loss, bilateral: Secondary | ICD-10-CM | POA: Diagnosis not present

## 2023-02-14 DIAGNOSIS — Z853 Personal history of malignant neoplasm of breast: Secondary | ICD-10-CM | POA: Diagnosis not present

## 2023-02-14 DIAGNOSIS — J309 Allergic rhinitis, unspecified: Secondary | ICD-10-CM | POA: Diagnosis not present

## 2023-02-14 DIAGNOSIS — M810 Age-related osteoporosis without current pathological fracture: Secondary | ICD-10-CM | POA: Diagnosis not present

## 2023-02-14 DIAGNOSIS — E559 Vitamin D deficiency, unspecified: Secondary | ICD-10-CM | POA: Diagnosis not present

## 2023-02-14 DIAGNOSIS — Z9049 Acquired absence of other specified parts of digestive tract: Secondary | ICD-10-CM | POA: Diagnosis not present

## 2023-02-14 DIAGNOSIS — Z9013 Acquired absence of bilateral breasts and nipples: Secondary | ICD-10-CM | POA: Diagnosis not present

## 2023-02-14 DIAGNOSIS — M5441 Lumbago with sciatica, right side: Secondary | ICD-10-CM | POA: Diagnosis not present

## 2023-02-14 DIAGNOSIS — Z87440 Personal history of urinary (tract) infections: Secondary | ICD-10-CM | POA: Diagnosis not present

## 2023-02-14 DIAGNOSIS — Z96651 Presence of right artificial knee joint: Secondary | ICD-10-CM | POA: Diagnosis not present

## 2023-02-14 DIAGNOSIS — M5136 Other intervertebral disc degeneration, lumbar region: Secondary | ICD-10-CM | POA: Diagnosis not present

## 2023-02-14 DIAGNOSIS — M5030 Other cervical disc degeneration, unspecified cervical region: Secondary | ICD-10-CM | POA: Diagnosis not present

## 2023-02-14 DIAGNOSIS — B0229 Other postherpetic nervous system involvement: Secondary | ICD-10-CM | POA: Diagnosis not present

## 2023-02-14 DIAGNOSIS — G4485 Primary stabbing headache: Secondary | ICD-10-CM | POA: Diagnosis not present

## 2023-02-14 DIAGNOSIS — M419 Scoliosis, unspecified: Secondary | ICD-10-CM | POA: Diagnosis not present

## 2023-02-14 DIAGNOSIS — K5730 Diverticulosis of large intestine without perforation or abscess without bleeding: Secondary | ICD-10-CM | POA: Diagnosis not present

## 2023-02-21 DIAGNOSIS — Z96651 Presence of right artificial knee joint: Secondary | ICD-10-CM | POA: Diagnosis not present

## 2023-02-21 DIAGNOSIS — M419 Scoliosis, unspecified: Secondary | ICD-10-CM | POA: Diagnosis not present

## 2023-02-21 DIAGNOSIS — K5730 Diverticulosis of large intestine without perforation or abscess without bleeding: Secondary | ICD-10-CM | POA: Diagnosis not present

## 2023-02-21 DIAGNOSIS — Z87440 Personal history of urinary (tract) infections: Secondary | ICD-10-CM | POA: Diagnosis not present

## 2023-02-21 DIAGNOSIS — H903 Sensorineural hearing loss, bilateral: Secondary | ICD-10-CM | POA: Diagnosis not present

## 2023-02-21 DIAGNOSIS — Z85828 Personal history of other malignant neoplasm of skin: Secondary | ICD-10-CM | POA: Diagnosis not present

## 2023-02-21 DIAGNOSIS — Z9013 Acquired absence of bilateral breasts and nipples: Secondary | ICD-10-CM | POA: Diagnosis not present

## 2023-02-21 DIAGNOSIS — E559 Vitamin D deficiency, unspecified: Secondary | ICD-10-CM | POA: Diagnosis not present

## 2023-02-21 DIAGNOSIS — M5136 Other intervertebral disc degeneration, lumbar region: Secondary | ICD-10-CM | POA: Diagnosis not present

## 2023-02-21 DIAGNOSIS — Z9181 History of falling: Secondary | ICD-10-CM | POA: Diagnosis not present

## 2023-02-21 DIAGNOSIS — M810 Age-related osteoporosis without current pathological fracture: Secondary | ICD-10-CM | POA: Diagnosis not present

## 2023-02-21 DIAGNOSIS — Z79899 Other long term (current) drug therapy: Secondary | ICD-10-CM | POA: Diagnosis not present

## 2023-02-21 DIAGNOSIS — L820 Inflamed seborrheic keratosis: Secondary | ICD-10-CM | POA: Diagnosis not present

## 2023-02-21 DIAGNOSIS — M5442 Lumbago with sciatica, left side: Secondary | ICD-10-CM | POA: Diagnosis not present

## 2023-02-21 DIAGNOSIS — G4485 Primary stabbing headache: Secondary | ICD-10-CM | POA: Diagnosis not present

## 2023-02-21 DIAGNOSIS — B0229 Other postherpetic nervous system involvement: Secondary | ICD-10-CM | POA: Diagnosis not present

## 2023-02-21 DIAGNOSIS — J309 Allergic rhinitis, unspecified: Secondary | ICD-10-CM | POA: Diagnosis not present

## 2023-02-21 DIAGNOSIS — M5441 Lumbago with sciatica, right side: Secondary | ICD-10-CM | POA: Diagnosis not present

## 2023-02-21 DIAGNOSIS — M5030 Other cervical disc degeneration, unspecified cervical region: Secondary | ICD-10-CM | POA: Diagnosis not present

## 2023-02-21 DIAGNOSIS — Z853 Personal history of malignant neoplasm of breast: Secondary | ICD-10-CM | POA: Diagnosis not present

## 2023-02-21 DIAGNOSIS — Z9049 Acquired absence of other specified parts of digestive tract: Secondary | ICD-10-CM | POA: Diagnosis not present

## 2023-03-01 DIAGNOSIS — Z9013 Acquired absence of bilateral breasts and nipples: Secondary | ICD-10-CM | POA: Diagnosis not present

## 2023-03-01 DIAGNOSIS — Z96651 Presence of right artificial knee joint: Secondary | ICD-10-CM | POA: Diagnosis not present

## 2023-03-01 DIAGNOSIS — Z9049 Acquired absence of other specified parts of digestive tract: Secondary | ICD-10-CM | POA: Diagnosis not present

## 2023-03-01 DIAGNOSIS — Z85828 Personal history of other malignant neoplasm of skin: Secondary | ICD-10-CM | POA: Diagnosis not present

## 2023-03-01 DIAGNOSIS — Z87440 Personal history of urinary (tract) infections: Secondary | ICD-10-CM | POA: Diagnosis not present

## 2023-03-01 DIAGNOSIS — Z853 Personal history of malignant neoplasm of breast: Secondary | ICD-10-CM | POA: Diagnosis not present

## 2023-03-01 DIAGNOSIS — K5730 Diverticulosis of large intestine without perforation or abscess without bleeding: Secondary | ICD-10-CM | POA: Diagnosis not present

## 2023-03-01 DIAGNOSIS — M810 Age-related osteoporosis without current pathological fracture: Secondary | ICD-10-CM | POA: Diagnosis not present

## 2023-03-01 DIAGNOSIS — Z79899 Other long term (current) drug therapy: Secondary | ICD-10-CM | POA: Diagnosis not present

## 2023-03-01 DIAGNOSIS — M5136 Other intervertebral disc degeneration, lumbar region: Secondary | ICD-10-CM | POA: Diagnosis not present

## 2023-03-01 DIAGNOSIS — M5441 Lumbago with sciatica, right side: Secondary | ICD-10-CM | POA: Diagnosis not present

## 2023-03-01 DIAGNOSIS — M5030 Other cervical disc degeneration, unspecified cervical region: Secondary | ICD-10-CM | POA: Diagnosis not present

## 2023-03-01 DIAGNOSIS — L820 Inflamed seborrheic keratosis: Secondary | ICD-10-CM | POA: Diagnosis not present

## 2023-03-01 DIAGNOSIS — B0229 Other postherpetic nervous system involvement: Secondary | ICD-10-CM | POA: Diagnosis not present

## 2023-03-01 DIAGNOSIS — M419 Scoliosis, unspecified: Secondary | ICD-10-CM | POA: Diagnosis not present

## 2023-03-01 DIAGNOSIS — M5442 Lumbago with sciatica, left side: Secondary | ICD-10-CM | POA: Diagnosis not present

## 2023-03-01 DIAGNOSIS — Z9181 History of falling: Secondary | ICD-10-CM | POA: Diagnosis not present

## 2023-03-01 DIAGNOSIS — G4485 Primary stabbing headache: Secondary | ICD-10-CM | POA: Diagnosis not present

## 2023-03-01 DIAGNOSIS — E559 Vitamin D deficiency, unspecified: Secondary | ICD-10-CM | POA: Diagnosis not present

## 2023-03-01 DIAGNOSIS — H903 Sensorineural hearing loss, bilateral: Secondary | ICD-10-CM | POA: Diagnosis not present

## 2023-03-01 DIAGNOSIS — J309 Allergic rhinitis, unspecified: Secondary | ICD-10-CM | POA: Diagnosis not present

## 2023-03-08 DIAGNOSIS — H903 Sensorineural hearing loss, bilateral: Secondary | ICD-10-CM | POA: Diagnosis not present

## 2023-03-08 DIAGNOSIS — M810 Age-related osteoporosis without current pathological fracture: Secondary | ICD-10-CM | POA: Diagnosis not present

## 2023-03-08 DIAGNOSIS — M5441 Lumbago with sciatica, right side: Secondary | ICD-10-CM | POA: Diagnosis not present

## 2023-03-08 DIAGNOSIS — Z85828 Personal history of other malignant neoplasm of skin: Secondary | ICD-10-CM | POA: Diagnosis not present

## 2023-03-08 DIAGNOSIS — M5136 Other intervertebral disc degeneration, lumbar region: Secondary | ICD-10-CM | POA: Diagnosis not present

## 2023-03-08 DIAGNOSIS — Z9181 History of falling: Secondary | ICD-10-CM | POA: Diagnosis not present

## 2023-03-08 DIAGNOSIS — Z96651 Presence of right artificial knee joint: Secondary | ICD-10-CM | POA: Diagnosis not present

## 2023-03-08 DIAGNOSIS — M5442 Lumbago with sciatica, left side: Secondary | ICD-10-CM | POA: Diagnosis not present

## 2023-03-08 DIAGNOSIS — E559 Vitamin D deficiency, unspecified: Secondary | ICD-10-CM | POA: Diagnosis not present

## 2023-03-08 DIAGNOSIS — M5030 Other cervical disc degeneration, unspecified cervical region: Secondary | ICD-10-CM | POA: Diagnosis not present

## 2023-03-08 DIAGNOSIS — Z853 Personal history of malignant neoplasm of breast: Secondary | ICD-10-CM | POA: Diagnosis not present

## 2023-03-08 DIAGNOSIS — Z79899 Other long term (current) drug therapy: Secondary | ICD-10-CM | POA: Diagnosis not present

## 2023-03-08 DIAGNOSIS — L820 Inflamed seborrheic keratosis: Secondary | ICD-10-CM | POA: Diagnosis not present

## 2023-03-08 DIAGNOSIS — Z9049 Acquired absence of other specified parts of digestive tract: Secondary | ICD-10-CM | POA: Diagnosis not present

## 2023-03-08 DIAGNOSIS — G4485 Primary stabbing headache: Secondary | ICD-10-CM | POA: Diagnosis not present

## 2023-03-08 DIAGNOSIS — B0229 Other postherpetic nervous system involvement: Secondary | ICD-10-CM | POA: Diagnosis not present

## 2023-03-08 DIAGNOSIS — Z87440 Personal history of urinary (tract) infections: Secondary | ICD-10-CM | POA: Diagnosis not present

## 2023-03-08 DIAGNOSIS — K5730 Diverticulosis of large intestine without perforation or abscess without bleeding: Secondary | ICD-10-CM | POA: Diagnosis not present

## 2023-03-08 DIAGNOSIS — Z9013 Acquired absence of bilateral breasts and nipples: Secondary | ICD-10-CM | POA: Diagnosis not present

## 2023-03-08 DIAGNOSIS — M419 Scoliosis, unspecified: Secondary | ICD-10-CM | POA: Diagnosis not present

## 2023-03-08 DIAGNOSIS — J309 Allergic rhinitis, unspecified: Secondary | ICD-10-CM | POA: Diagnosis not present

## 2023-03-11 DIAGNOSIS — Z961 Presence of intraocular lens: Secondary | ICD-10-CM | POA: Diagnosis not present

## 2023-03-11 DIAGNOSIS — H401112 Primary open-angle glaucoma, right eye, moderate stage: Secondary | ICD-10-CM | POA: Diagnosis not present

## 2023-03-11 DIAGNOSIS — H353131 Nonexudative age-related macular degeneration, bilateral, early dry stage: Secondary | ICD-10-CM | POA: Diagnosis not present

## 2023-03-13 DIAGNOSIS — G4485 Primary stabbing headache: Secondary | ICD-10-CM | POA: Diagnosis not present

## 2023-03-13 DIAGNOSIS — Z9013 Acquired absence of bilateral breasts and nipples: Secondary | ICD-10-CM | POA: Diagnosis not present

## 2023-03-13 DIAGNOSIS — H903 Sensorineural hearing loss, bilateral: Secondary | ICD-10-CM | POA: Diagnosis not present

## 2023-03-13 DIAGNOSIS — Z85828 Personal history of other malignant neoplasm of skin: Secondary | ICD-10-CM | POA: Diagnosis not present

## 2023-03-13 DIAGNOSIS — M5442 Lumbago with sciatica, left side: Secondary | ICD-10-CM | POA: Diagnosis not present

## 2023-03-13 DIAGNOSIS — E559 Vitamin D deficiency, unspecified: Secondary | ICD-10-CM | POA: Diagnosis not present

## 2023-03-13 DIAGNOSIS — B0229 Other postherpetic nervous system involvement: Secondary | ICD-10-CM | POA: Diagnosis not present

## 2023-03-13 DIAGNOSIS — K5730 Diverticulosis of large intestine without perforation or abscess without bleeding: Secondary | ICD-10-CM | POA: Diagnosis not present

## 2023-03-13 DIAGNOSIS — M419 Scoliosis, unspecified: Secondary | ICD-10-CM | POA: Diagnosis not present

## 2023-03-13 DIAGNOSIS — Z79899 Other long term (current) drug therapy: Secondary | ICD-10-CM | POA: Diagnosis not present

## 2023-03-13 DIAGNOSIS — M810 Age-related osteoporosis without current pathological fracture: Secondary | ICD-10-CM | POA: Diagnosis not present

## 2023-03-13 DIAGNOSIS — Z9181 History of falling: Secondary | ICD-10-CM | POA: Diagnosis not present

## 2023-03-13 DIAGNOSIS — Z87440 Personal history of urinary (tract) infections: Secondary | ICD-10-CM | POA: Diagnosis not present

## 2023-03-13 DIAGNOSIS — Z853 Personal history of malignant neoplasm of breast: Secondary | ICD-10-CM | POA: Diagnosis not present

## 2023-03-13 DIAGNOSIS — J309 Allergic rhinitis, unspecified: Secondary | ICD-10-CM | POA: Diagnosis not present

## 2023-03-13 DIAGNOSIS — Z96651 Presence of right artificial knee joint: Secondary | ICD-10-CM | POA: Diagnosis not present

## 2023-03-13 DIAGNOSIS — M5136 Other intervertebral disc degeneration, lumbar region: Secondary | ICD-10-CM | POA: Diagnosis not present

## 2023-03-13 DIAGNOSIS — M5441 Lumbago with sciatica, right side: Secondary | ICD-10-CM | POA: Diagnosis not present

## 2023-03-13 DIAGNOSIS — L820 Inflamed seborrheic keratosis: Secondary | ICD-10-CM | POA: Diagnosis not present

## 2023-03-13 DIAGNOSIS — Z9049 Acquired absence of other specified parts of digestive tract: Secondary | ICD-10-CM | POA: Diagnosis not present

## 2023-03-13 DIAGNOSIS — M5030 Other cervical disc degeneration, unspecified cervical region: Secondary | ICD-10-CM | POA: Diagnosis not present

## 2023-03-14 DIAGNOSIS — S0501XA Injury of conjunctiva and corneal abrasion without foreign body, right eye, initial encounter: Secondary | ICD-10-CM | POA: Diagnosis not present

## 2023-03-14 DIAGNOSIS — Z961 Presence of intraocular lens: Secondary | ICD-10-CM | POA: Diagnosis not present

## 2023-03-25 DIAGNOSIS — M5136 Other intervertebral disc degeneration, lumbar region: Secondary | ICD-10-CM | POA: Diagnosis not present

## 2023-03-25 DIAGNOSIS — Z9049 Acquired absence of other specified parts of digestive tract: Secondary | ICD-10-CM | POA: Diagnosis not present

## 2023-03-25 DIAGNOSIS — B0229 Other postherpetic nervous system involvement: Secondary | ICD-10-CM | POA: Diagnosis not present

## 2023-03-25 DIAGNOSIS — H903 Sensorineural hearing loss, bilateral: Secondary | ICD-10-CM | POA: Diagnosis not present

## 2023-03-25 DIAGNOSIS — G4485 Primary stabbing headache: Secondary | ICD-10-CM | POA: Diagnosis not present

## 2023-03-25 DIAGNOSIS — M419 Scoliosis, unspecified: Secondary | ICD-10-CM | POA: Diagnosis not present

## 2023-03-25 DIAGNOSIS — M5030 Other cervical disc degeneration, unspecified cervical region: Secondary | ICD-10-CM | POA: Diagnosis not present

## 2023-03-25 DIAGNOSIS — M5442 Lumbago with sciatica, left side: Secondary | ICD-10-CM | POA: Diagnosis not present

## 2023-03-25 DIAGNOSIS — Z9181 History of falling: Secondary | ICD-10-CM | POA: Diagnosis not present

## 2023-03-25 DIAGNOSIS — M810 Age-related osteoporosis without current pathological fracture: Secondary | ICD-10-CM | POA: Diagnosis not present

## 2023-03-25 DIAGNOSIS — Z853 Personal history of malignant neoplasm of breast: Secondary | ICD-10-CM | POA: Diagnosis not present

## 2023-03-25 DIAGNOSIS — Z87440 Personal history of urinary (tract) infections: Secondary | ICD-10-CM | POA: Diagnosis not present

## 2023-03-25 DIAGNOSIS — L820 Inflamed seborrheic keratosis: Secondary | ICD-10-CM | POA: Diagnosis not present

## 2023-03-25 DIAGNOSIS — Z85828 Personal history of other malignant neoplasm of skin: Secondary | ICD-10-CM | POA: Diagnosis not present

## 2023-03-25 DIAGNOSIS — K5730 Diverticulosis of large intestine without perforation or abscess without bleeding: Secondary | ICD-10-CM | POA: Diagnosis not present

## 2023-03-25 DIAGNOSIS — Z96651 Presence of right artificial knee joint: Secondary | ICD-10-CM | POA: Diagnosis not present

## 2023-03-25 DIAGNOSIS — Z9013 Acquired absence of bilateral breasts and nipples: Secondary | ICD-10-CM | POA: Diagnosis not present

## 2023-03-25 DIAGNOSIS — Z79899 Other long term (current) drug therapy: Secondary | ICD-10-CM | POA: Diagnosis not present

## 2023-03-25 DIAGNOSIS — E559 Vitamin D deficiency, unspecified: Secondary | ICD-10-CM | POA: Diagnosis not present

## 2023-03-25 DIAGNOSIS — J309 Allergic rhinitis, unspecified: Secondary | ICD-10-CM | POA: Diagnosis not present

## 2023-03-25 DIAGNOSIS — M5441 Lumbago with sciatica, right side: Secondary | ICD-10-CM | POA: Diagnosis not present

## 2023-03-26 DIAGNOSIS — S0501XA Injury of conjunctiva and corneal abrasion without foreign body, right eye, initial encounter: Secondary | ICD-10-CM | POA: Diagnosis not present

## 2023-03-26 DIAGNOSIS — Z961 Presence of intraocular lens: Secondary | ICD-10-CM | POA: Diagnosis not present

## 2023-03-26 DIAGNOSIS — H401112 Primary open-angle glaucoma, right eye, moderate stage: Secondary | ICD-10-CM | POA: Diagnosis not present

## 2023-04-01 DIAGNOSIS — H02814 Retained foreign body in left upper eyelid: Secondary | ICD-10-CM | POA: Diagnosis not present

## 2023-04-01 DIAGNOSIS — S0502XA Injury of conjunctiva and corneal abrasion without foreign body, left eye, initial encounter: Secondary | ICD-10-CM | POA: Diagnosis not present

## 2023-04-01 DIAGNOSIS — H16042 Marginal corneal ulcer, left eye: Secondary | ICD-10-CM | POA: Diagnosis not present

## 2023-04-02 DIAGNOSIS — D2239 Melanocytic nevi of other parts of face: Secondary | ICD-10-CM | POA: Diagnosis not present

## 2023-04-02 DIAGNOSIS — L570 Actinic keratosis: Secondary | ICD-10-CM | POA: Diagnosis not present

## 2023-04-02 DIAGNOSIS — Z85828 Personal history of other malignant neoplasm of skin: Secondary | ICD-10-CM | POA: Diagnosis not present

## 2023-04-02 DIAGNOSIS — Z808 Family history of malignant neoplasm of other organs or systems: Secondary | ICD-10-CM | POA: Diagnosis not present

## 2023-04-02 DIAGNOSIS — C44329 Squamous cell carcinoma of skin of other parts of face: Secondary | ICD-10-CM | POA: Diagnosis not present

## 2023-04-02 DIAGNOSIS — L578 Other skin changes due to chronic exposure to nonionizing radiation: Secondary | ICD-10-CM | POA: Diagnosis not present

## 2023-04-02 DIAGNOSIS — D225 Melanocytic nevi of trunk: Secondary | ICD-10-CM | POA: Diagnosis not present

## 2023-04-02 DIAGNOSIS — D2261 Melanocytic nevi of right upper limb, including shoulder: Secondary | ICD-10-CM | POA: Diagnosis not present

## 2023-04-02 DIAGNOSIS — D485 Neoplasm of uncertain behavior of skin: Secondary | ICD-10-CM | POA: Diagnosis not present

## 2023-04-02 DIAGNOSIS — L821 Other seborrheic keratosis: Secondary | ICD-10-CM | POA: Diagnosis not present

## 2023-04-08 DIAGNOSIS — H02811 Retained foreign body in right upper eyelid: Secondary | ICD-10-CM | POA: Diagnosis not present

## 2023-04-08 DIAGNOSIS — H02814 Retained foreign body in left upper eyelid: Secondary | ICD-10-CM | POA: Diagnosis not present

## 2023-04-23 DIAGNOSIS — H02814 Retained foreign body in left upper eyelid: Secondary | ICD-10-CM | POA: Diagnosis not present

## 2023-04-23 DIAGNOSIS — H02811 Retained foreign body in right upper eyelid: Secondary | ICD-10-CM | POA: Diagnosis not present

## 2023-04-30 DIAGNOSIS — C44329 Squamous cell carcinoma of skin of other parts of face: Secondary | ICD-10-CM | POA: Diagnosis not present

## 2023-05-02 DIAGNOSIS — H02814 Retained foreign body in left upper eyelid: Secondary | ICD-10-CM | POA: Diagnosis not present

## 2023-05-02 DIAGNOSIS — H02811 Retained foreign body in right upper eyelid: Secondary | ICD-10-CM | POA: Diagnosis not present

## 2023-05-02 DIAGNOSIS — H401112 Primary open-angle glaucoma, right eye, moderate stage: Secondary | ICD-10-CM | POA: Diagnosis not present

## 2023-05-09 ENCOUNTER — Encounter: Payer: Self-pay | Admitting: Family Medicine

## 2023-05-09 ENCOUNTER — Ambulatory Visit (INDEPENDENT_AMBULATORY_CARE_PROVIDER_SITE_OTHER): Payer: Medicare Other | Admitting: Family Medicine

## 2023-05-09 VITALS — BP 122/78 | HR 100 | Temp 96.8°F | Ht 59.0 in | Wt 130.0 lb

## 2023-05-09 DIAGNOSIS — R300 Dysuria: Secondary | ICD-10-CM

## 2023-05-09 DIAGNOSIS — N3000 Acute cystitis without hematuria: Secondary | ICD-10-CM | POA: Diagnosis not present

## 2023-05-09 LAB — POCT URINALYSIS DIPSTICK
Bilirubin, UA: NEGATIVE
Blood, UA: NEGATIVE
Glucose, UA: NEGATIVE
Ketones, UA: 5
Nitrite, UA: NEGATIVE
Protein, UA: POSITIVE — AB
Spec Grav, UA: 1.025 (ref 1.010–1.025)
Urobilinogen, UA: 0.2 U/dL
pH, UA: 5.5 (ref 5.0–8.0)

## 2023-05-09 MED ORDER — CIPROFLOXACIN HCL 500 MG PO TABS: 500.0000 mg | ORAL_TABLET | Freq: Two times a day (BID) | ORAL | 0 refills | Status: DC

## 2023-05-09 MED ORDER — CIPROFLOXACIN HCL 500 MG PO TABS: 500.0000 mg | ORAL_TABLET | Freq: Two times a day (BID) | ORAL | 0 refills | Status: AC

## 2023-05-09 NOTE — Progress Notes (Signed)
Established Patient Office Visit   Subjective:  Patient ID: Anna [name], female    DOB: 1934-07-24  Age: 89 y.o. MRN: [id number]  Chief Complaint  Patient presents with   Dysuria    Frequency and urgency and pain with voiding x 1 day.    Dysuria  Associated symptoms include frequency and urgency. Pertinent negatives include no chills, flank pain, hematuria or nausea.   Encounter Diagnoses  Name Primary?   Dysuria Yes   Acute cystitis without hematuria    1 day history of urinary frequency urgency and burning in the urethral area after voiding.  There has been no blood.  Denies fevers chills nausea vomiting or back pain.  Diagnosed with UTI back in May that was treated with cephalexin.   Review of Systems  Constitutional: Negative.  Negative for chills and fever.  HENT: Negative.    Eyes:  Negative for blurred vision, discharge and redness.  Respiratory: Negative.    Cardiovascular: Negative.   Gastrointestinal:  Negative for abdominal pain, heartburn and nausea.  Genitourinary:  Positive for dysuria, frequency and urgency. Negative for flank pain and hematuria.  Musculoskeletal: Negative.  Negative for myalgias.  Skin:  Negative for rash.  Neurological:  Negative for tingling, loss of consciousness and weakness.  Endo/Heme/Allergies:  Negative for polydipsia.     Current Outpatient Medications:    ascorbic acid (VITAMIN C) 500 MG tablet, Take 500 mg by mouth daily., Disp: , Rfl:    b complex vitamins capsule, Take 1 capsule by mouth daily., Disp: , Rfl:    cetirizine (ZYRTEC) 10 MG tablet, Take 10 mg by mouth every evening., Disp: , Rfl:    gabapentin (NEURONTIN) 300 MG capsule, Take 1 capsule (300 mg total) by mouth 2 (two) times daily., Disp: 60 capsule, Rfl: 5   latanoprost (XALATAN) 0.005 % ophthalmic solution, Place 1 drop into both eyes every evening., Disp: , Rfl:    Lifitegrast (XIIDRA) 5 % SOLN, Place 1 drop into both eyes 2 (two) times daily., Disp: , Rfl:     Lutein 10 MG TABS, Take 1 tablet by mouth daily., Disp: , Rfl:    Multiple Vitamins-Minerals (PRESERVISION AREDS 2 PO), Take 1 tablet by mouth in the morning and at bedtime., Disp: , Rfl:    Omega-3 1000 MG CAPS, Take 1 capsule by mouth daily., Disp: , Rfl:    triamcinolone (NASACORT ALLERGY 24HR) 55 MCG/ACT AERO nasal inhaler, Place 2 sprays into the nose daily., Disp: , Rfl:    alendronate (FOSAMAX) 70 MG tablet, Take 1 tablet (70 mg total) by mouth every 7 (seven) days. Take with a full glass of water on an empty stomach. (Patient not taking: Reported on 01/16/2023), Disp: 4 tablet, Rfl: 11   cephALEXin (KEFLEX) 500 MG capsule, 2 caps po bid x 7 days (Patient not taking: Reported on 05/09/2023), Disp: 28 capsule, Rfl: 0   ciprofloxacin (CIPRO) 500 MG tablet, Take 1 tablet (500 mg total) by mouth 2 (two) times daily for 7 days., Disp: 14 tablet, Rfl: 0   Objective:     BP 122/78   Pulse 100   Temp (!) 96.8 F (36 C)   Ht 4\' 11"  (1.499 m)   Wt 130 lb (59 kg)   SpO2 98%   BMI 26.26 kg/m    Physical Exam Constitutional:      General: She is not in acute distress.    Appearance: Normal appearance. She is not ill-appearing, toxic-appearing or diaphoretic.  HENT:  Head: Normocephalic and atraumatic.     Right Ear: External ear normal.     Left Ear: External ear normal.  Eyes:     General: No scleral icterus.       Right eye: No discharge.        Left eye: No discharge.     Extraocular Movements: Extraocular movements intact.     Conjunctiva/sclera: Conjunctivae normal.  Cardiovascular:     Rate and Rhythm: Normal rate and regular rhythm.  Pulmonary:     Effort: Pulmonary effort is normal. No respiratory distress.     Breath sounds: Normal breath sounds.  Abdominal:     Tenderness: There is no right CVA tenderness or left CVA tenderness.  Musculoskeletal:     Cervical back: No rigidity or tenderness.  Skin:    General: Skin is warm and dry.  Neurological:     Mental  Status: She is alert and oriented to person, place, and time.  Psychiatric:        Mood and Affect: Mood normal.        Behavior: Behavior normal.      Results for orders placed or performed in visit on 05/09/23  POCT Urinalysis Dipstick  Result Value Ref Range   Color, UA dark yello    Clarity, UA cloudy    Glucose, UA Negative Negative   Bilirubin, UA neg    Ketones, UA 5    Spec Grav, UA 1.025 1.010 - 1.025   Blood, UA neg    pH, UA 5.5 5.0 - 8.0   Protein, UA Positive (A) Negative   Urobilinogen, UA 0.2 0.2 or 1.0 E.U./dL   Nitrite, UA neg    Leukocytes, UA Moderate (2+) (A) Negative   Appearance     Odor        The ASCVD Risk score (Arnett DK, et al., 2019) failed to calculate for the following reasons:   The 2019 ASCVD risk score is only valid for ages 16 to 66    Assessment & Plan:   Dysuria -     POCT urinalysis dipstick -     Urine Culture  Acute cystitis without hematuria -     Urine Culture -     Ciprofloxacin HCl; Take 1 tablet (500 mg total) by mouth 2 (two) times daily for 7 days.  Dispense: 14 tablet; Refill: 0    Return Schedule follow-up appointment with Dr. Veto Kemps in 2 weeks.  Results of urinalysis are consistent with UTI.  Recently took cephalexin for UTI treated through the emergency department.  Culture was not sent.  She is allergic to sulfur.  She says that she does well with Cipro.Mliss Sax, MD

## 2023-05-10 LAB — URINE CULTURE
MICRO NUMBER:: 15458236
SPECIMEN QUALITY:: ADEQUATE

## 2023-05-28 ENCOUNTER — Encounter: Payer: Self-pay | Admitting: Family Medicine

## 2023-05-28 ENCOUNTER — Ambulatory Visit (INDEPENDENT_AMBULATORY_CARE_PROVIDER_SITE_OTHER): Payer: Medicare Other | Admitting: Family Medicine

## 2023-05-28 ENCOUNTER — Telehealth: Payer: Self-pay

## 2023-05-28 VITALS — BP 130/70 | HR 93 | Temp 98.7°F | Ht 59.0 in | Wt 129.6 lb

## 2023-05-28 DIAGNOSIS — B0229 Other postherpetic nervous system involvement: Secondary | ICD-10-CM

## 2023-05-28 DIAGNOSIS — C44329 Squamous cell carcinoma of skin of other parts of face: Secondary | ICD-10-CM | POA: Insufficient documentation

## 2023-05-28 DIAGNOSIS — L608 Other nail disorders: Secondary | ICD-10-CM | POA: Diagnosis not present

## 2023-05-28 DIAGNOSIS — G609 Hereditary and idiopathic neuropathy, unspecified: Secondary | ICD-10-CM

## 2023-05-28 DIAGNOSIS — M810 Age-related osteoporosis without current pathological fracture: Secondary | ICD-10-CM | POA: Diagnosis not present

## 2023-05-28 MED ORDER — GABAPENTIN 300 MG PO CAPS: 300.0000 mg | ORAL_CAPSULE | Freq: Every day | ORAL | 3 refills | Status: DC

## 2023-05-28 NOTE — Assessment & Plan Note (Signed)
Stable. Occasional itching sensation. Continue gabapentin 300 mg qhs.

## 2023-05-28 NOTE — Patient Instructions (Signed)
Visit Information  Thank you for taking time to visit with me today. Please don't hesitate to contact me if I can be of assistance to you.   Following are the goals we discussed today:   Goals Addressed             This Visit's Progress    COMPLETED: Care Coordination Activities-No follow up required       Care Coordination Interventions: Advised patient to Annual Wellness exam. Discussed [facility] services and support. Assessed SDOH. Advised to discuss with primary care physician if services needed in the future.          If you are experiencing a Mental Health or Behavioral Health Crisis or need someone to talk to, please call the Suicide and Crisis Lifeline: 988   The patient verbalized understanding of instructions, educational materials, and care plan provided today and DECLINED offer to receive copy of patient instructions, educational materials, and care plan.   The patient has been provided with contact information for the care management team and has been advised to call with any health related questions or concerns.   Bary Leriche, RN, MSN [facility], [facility] Management Community Coordinator Direct Dial: [phone]  Fax: [phone] Website: [email or web address]

## 2023-05-28 NOTE — Patient Outreach (Signed)
Care Coordination   Initial Visit Note   05/28/2023 Name: Anna [name] MRN: [id number] DOB: Mar 03, 1934  Anna [name] is a 89 y.o. year old female who sees Rudd, Bertram Millard, MD for primary care. I spoke with  [name] by phone today.  What matters to the patients health and wellness today?  none    Goals Addressed             This Visit's Progress    COMPLETED: Care Coordination Activities-No follow up required       Care Coordination Interventions: Advised patient to Annual Wellness exam. Discussed [facility] services and support. Assessed SDOH. Advised to discuss with primary care physician if services needed in the future.          SDOH assessments and interventions completed:  Yes  SDOH Interventions Today    Flowsheet Row Most Recent Value  SDOH Interventions   Food Insecurity Interventions Intervention Not Indicated  Housing Interventions Intervention Not Indicated        Care Coordination Interventions:  Yes, provided   Follow up plan: No further intervention required.   Encounter Outcome:  Patient Visit Completed   Bary Leriche, RN, MSN [facility]  [facility], [facility] Management Community Coordinator Direct Dial: [phone]  Fax: [phone] Website: [email or web address]

## 2023-05-28 NOTE — Assessment & Plan Note (Signed)
Stable. Continue gabapentin 300 mg qhs and Voltaren gel topically.

## 2023-05-28 NOTE — Assessment & Plan Note (Signed)
It is reasonable that she has stopped alendronate. I do not feel it would be reasonable to try and start an alternative, such as Prolia, as the risks likely outweighs any benefit for her.

## 2023-05-28 NOTE — Progress Notes (Signed)
[facility] PRIMARY CARE [facility] [address] [place] [address] Dept: [phone] Dept Fax: [phone]  Chronic Care Office Visit  Subjective:    Patient ID: Anna [name], female    DOB: 1934-06-06, 89 y.o..   MRN: [id number]  Chief Complaint  Patient presents with   Follow-up    F/u.  C/o possible toenail fungus.    History of Present Illness:  Patient is in today for reassessment of chronic medical issues.  Ms. Diglio was seen recently by Dr. Doreene Burke with a urinary tract infection. She notes that she is feeling better at present.    Ms. Towle has a history of idiopathic peripheral neuropathy. She also had shingles with post-herpetic neuralgia in July 2023. She continues to use gabapentin, but only at bedtime. She is using some topical Voltaren gel and finds this helps with the lower leg burning as well.   Ms. Blakeslee had been tried on alendronate related to osteoporosis and kyphoscoliosis. She notes despite drinking water afterwards, she had a sensation that the medicine was catching in her throat. She has since stopped this. She does drink 2% milk and eat cheeses regularly, so feels she is keeping up with her calcium intake.  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 05/28/2023   Kyphoscoliosis 01/16/2023   Physical deconditioning 01/16/2023   Post herpetic neuralgia 05/07/2022   Seborrheic keratoses 02/14/2022   History of basal cell carcinoma (BCC) 10/09/2021   Basal cell carcinoma (BCC) of dorsum of nose 10/09/2021   Osteoarthritis of multiple joints 01/05/2021   Chronic bilateral low back pain without sciatica 12/07/2019   Idiopathic stabbing headache 11/02/2019   Sensorineural hearing loss (SNHL) 08/07/2018   Idiopathic peripheral neuropathy 01/25/2016   Nephrolithiasis 03/29/2015   History of total knee arthroplasty 01/05/2015   Rosacea  07/14/2014   Degenerative joint disease of cervical and lumbar spine 07/14/2014   Microscopic hematuria 07/14/2014   Mammogram abnormal 07/14/2014   History of breast cancer in female 07/14/2014   Atrophic vaginitis 07/14/2014   Allergic rhinitis 07/14/2014   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   Vitamin D deficiency 05/18/2014   Osteoporosis 05/18/2014   Diverticulosis of large intestine without hemorrhage 05/18/2014   Past Surgical History:  Procedure Laterality Date   ABDOMINAL HYSTERECTOMY     APPENDECTOMY     BACK SURGERY     disc repaired 20 years ago    BREAST SURGERY     double mastectomy    CESAREAN SECTION     times 2   colonscopy      cyst repaired      following mammogram / cyst ruptured had to surgically repair area / left side    DILATION AND CURETTAGE OF UTERUS     EYE SURGERY     bilat cataract surgery    HAND SURGERY     left hand / joint issues with thumb    LUNG SURGERY     related to fatty tumor / left approx 12 years ago    right knee arthroscopy      TOTAL KNEE ARTHROPLASTY Right 01/05/2015   Procedure: RIGHT TOTAL KNEE ARTHROPLASTY;  Surgeon: Ranee Gosselin, MD;  Location: [facility] ORS;  Service: Orthopedics;  Laterality: Right;   History reviewed. No pertinent family history. Outpatient Medications Prior to Visit  Medication Sig Dispense Refill   ascorbic acid (VITAMIN C) 500 MG tablet Take 500 mg by mouth  daily.     b complex vitamins capsule Take 1 capsule by mouth daily.     cetirizine (ZYRTEC) 10 MG tablet Take 10 mg by mouth every evening.     gabapentin (NEURONTIN) 300 MG capsule Take 1 capsule (300 mg total) by mouth 2 (two) times daily. (Patient taking differently: Take 300 mg by mouth 2 (two) times daily. 1 at night) 60 capsule 5   latanoprost (XALATAN) 0.005 % ophthalmic solution Place 1 drop into both eyes every evening.     Lifitegrast (XIIDRA) 5 % SOLN Place 1 drop into both eyes 2 (two) times daily.     Lutein 10 MG TABS Take 1 tablet by mouth daily.      Multiple Vitamins-Minerals (PRESERVISION AREDS 2 PO) Take 1 tablet by mouth in the morning and at bedtime.     Omega-3 1000 MG CAPS Take 1 capsule by mouth daily.     triamcinolone (NASACORT ALLERGY 24HR) 55 MCG/ACT AERO nasal inhaler Place 2 sprays into the nose daily.     alendronate (FOSAMAX) 70 MG tablet Take 1 tablet (70 mg total) by mouth every 7 (seven) days. Take with a full glass of water on an empty stomach. (Patient not taking: Reported on 01/16/2023) 4 tablet 11   cephALEXin (KEFLEX) 500 MG capsule 2 caps po bid x 7 days (Patient not taking: Reported on 05/09/2023) 28 capsule 0   No facility-administered medications prior to visit.   Allergies  Allergen Reactions   Demerol [Meperidine] Nausea And Vomiting   Sulfa Antibiotics Nausea And Vomiting   Objective:   Today's Vitals   05/28/23 1536  BP: 130/70  Pulse: 93  Temp: 98.7 F (37.1 C)  TempSrc: Temporal  SpO2: 98%  Weight: 129 lb 9.6 oz (58.8 kg)  Height: 4\' 11"  (1.499 m)   Body mass index is 26.18 kg/m.   General: Well developed, well nourished. No acute distress. Feet: Most of the nails on the right foot and the great nail on the left are slightly discolored. However, there is   no thickening or significant onychogryphosis present. Psych: Alert and oriented. Normal mood and affect.  Health Maintenance Due  Topic Date Due   Zoster Vaccines- Shingrix (1 of 2) Never done   Pneumonia Vaccine 101+ Years old (1 of 1 - PCV) Never done   DEXA SCAN  Never done   Medicare Annual Wellness (AWV)  06/13/2023     Assessment & Plan:   Problem List Items Addressed This Visit       Nervous and Auditory   Idiopathic peripheral neuropathy    Stable. Continue gabapentin 300 mg qhs and Voltaren gel topically.      Relevant Medications   gabapentin (NEURONTIN) 300 MG capsule   Post herpetic neuralgia - Primary    Stable. Occasional itching sensation. Continue gabapentin 300 mg qhs.        Musculoskeletal and  Integument   Osteoporosis    It is reasonable that she has stopped alendronate. I do not feel it would be reasonable to try and start an alternative, such as Prolia, as the risks likely outweighs any benefit for her.      Other Visit Diagnoses     Discolored nails       The discoloration may be due to other skin issues rather than onychomycosis. No sign of paronychial issues or ingrown nails. I recommend expectant management.       Return in about 6 months (around 11/26/2023) for Reassessment.  Loyola Mast, MD

## 2023-06-21 ENCOUNTER — Ambulatory Visit (INDEPENDENT_AMBULATORY_CARE_PROVIDER_SITE_OTHER): Payer: Medicare Other

## 2023-06-21 DIAGNOSIS — Z0000 Encounter for general adult medical examination without abnormal findings: Secondary | ICD-10-CM | POA: Diagnosis not present

## 2023-06-21 NOTE — Patient Instructions (Signed)
[name] , Thank you for taking time to come for your Medicare Wellness Visit. I appreciate your ongoing commitment to your health goals. Please review the following plan we discussed and let me know if I can assist you in the future.   Referrals/Orders/Follow-Ups/Clinician Recommendations: none  This is a list of the screening recommended for you and due dates:  Health Maintenance  Topic Date Due   Zoster (Shingles) Vaccine (1 of 2) Never done   DEXA scan (bone density measurement)  Never done   COVID-19 Vaccine (3 - Pfizer risk series) 12/08/2019   Flu Shot  11/25/2023*   Pneumonia Vaccine (1 of 1 - PCV) 06/20/2024*   Medicare Annual Wellness Visit  06/20/2024   DTaP/Tdap/Td vaccine (2 - Td or Tdap) 02/11/2032   HPV Vaccine  Aged Out  *Topic was postponed. The date shown is not the original due date.    Advanced directives: (Copy Requested) Please bring a copy of your health care power of attorney and living will to the office to be added to your chart at your convenience.  Next Medicare Annual Wellness Visit scheduled for next year: No, will schedule next year  Insert Preventive Care attachment Insert FALL PREVENTION attachment if needed

## 2023-06-21 NOTE — Progress Notes (Signed)
Subjective:   Anna [name] is a 89 y.o. female who presents for Medicare Annual (Subsequent) preventive examination.  Visit Complete: Virtual I connected with  [name] on 06/21/23 by a audio enabled telemedicine application and verified that I am speaking with the correct person using two identifiers.  Patient Location: Home  Provider Location: Office/Clinic  I discussed the limitations of evaluation and management by telemedicine. The patient expressed understanding and agreed to proceed.  Vital Signs: Because this visit was a virtual/telehealth visit, some criteria may be missing or patient reported. Any vitals not documented were not able to be obtained and vitals that have been documented are patient reported.    Cardiac Risk Factors include: advanced age (>15men, >74 women)     Objective:    Today's Vitals   There is no height or weight on file to calculate BMI.     06/21/2023    2:04 PM 01/12/2023    6:37 PM 06/12/2022    1:57 PM 03/20/2022    2:00 PM 03/20/2022    3:39 AM 02/10/2022    2:40 PM 04/27/2021    9:58 AM  Advanced Directives  Does Patient Have a Medical Advance Directive? Yes No Yes Yes Yes Yes Yes  Type of Estate agent of [place];Living will  Healthcare Power of [place];Living will Healthcare Power of [place];Living will Healthcare Power of [place];Living will  Healthcare Power of Attorney  Does patient want to make changes to medical advance directive?    No - Patient declined     Copy of Healthcare Power of Attorney in Chart? No - copy requested  No - copy requested No - copy requested No - copy requested  No - copy requested  Would patient like information on creating a medical advance directive?  No - Patient declined         Current Medications (verified) Outpatient Encounter Medications as of 06/21/2023  Medication Sig   ascorbic acid (VITAMIN C) 500 MG tablet Take 500 mg by mouth daily.   b complex vitamins  capsule Take 1 capsule by mouth daily.   cetirizine (ZYRTEC) 10 MG tablet Take 10 mg by mouth every evening.   gabapentin (NEURONTIN) 300 MG capsule Take 1 capsule (300 mg total) by mouth at bedtime.   latanoprost (XALATAN) 0.005 % ophthalmic solution Place 1 drop into both eyes every evening.   Lifitegrast (XIIDRA) 5 % SOLN Place 1 drop into both eyes 2 (two) times daily.   Lutein 10 MG TABS Take 1 tablet by mouth daily.   Multiple Vitamins-Minerals (PRESERVISION AREDS 2 PO) Take 1 tablet by mouth in the morning and at bedtime.   Omega-3 1000 MG CAPS Take 1 capsule by mouth daily.   triamcinolone (NASACORT ALLERGY 24HR) 55 MCG/ACT AERO nasal inhaler Place 2 sprays into the nose daily.   No facility-administered encounter medications on file as of 06/21/2023.    Allergies (verified) Demerol [meperidine] and Sulfa antibiotics   History: Past Medical History:  Diagnosis Date   Allergy    seasonal also with dogs and cats   Arthritis    Asthma    hx of in childhood    Bladder infection    hx of    Cancer (HCC)    breast cancer bilat has had double mastectomy    Chronic kidney disease    hx of kidney stones last one pasted 1 month ago    COVID    Fibrocystic breast disease  H/O blood clots    during delivery occurred 50 years ago per left arm    PONV (postoperative nausea and vomiting)    pt states stays very sleepy for days following anesthesia    Past Surgical History:  Procedure Laterality Date   ABDOMINAL HYSTERECTOMY     APPENDECTOMY     BACK SURGERY     disc repaired 20 years ago    BREAST SURGERY     double mastectomy    CESAREAN SECTION     times 2   colonscopy      cyst repaired      following mammogram / cyst ruptured had to surgically repair area / left side    DILATION AND CURETTAGE OF UTERUS     EYE SURGERY     bilat cataract surgery    HAND SURGERY     left hand / joint issues with thumb    LUNG SURGERY     related to fatty tumor / left approx 12  years ago    right knee arthroscopy      TOTAL KNEE ARTHROPLASTY Right 01/05/2015   Procedure: RIGHT TOTAL KNEE ARTHROPLASTY;  Surgeon: Ranee Gosselin, MD;  Location: [facility];  Service: Orthopedics;  Laterality: Right;   History reviewed. No pertinent family history. Social History   Socioeconomic History   Marital status: Widowed    Spouse name: Not on file   Number of children: 2   Years of education: Not on file   Highest education level: Not on file  Occupational History   Not on file  Tobacco Use   Smoking status: Never   Smokeless tobacco: Never  Vaping Use   Vaping status: Never Used  Substance and Sexual Activity   Alcohol use: No   Drug use: No   Sexual activity: Not Currently  Other Topics Concern   Not on file  Social History Narrative   Right handed   Drinks caffeine   One story home      Son- Dineen Trojanowski, Montez Hageman.   Daughter- Aram Beecham Mustin   Social Determinants of Health   Financial Resource Strain: Low Risk  (06/21/2023)   Overall Financial Resource Strain (CARDIA)    Difficulty of Paying Living Expenses: Not hard at all  Food Insecurity: No Food Insecurity (06/21/2023)   Hunger Vital Sign    Worried About Running Out of Food in the Last Year: Never true    Ran Out of Food in the Last Year: Never true  Transportation Needs: No Transportation Needs (06/21/2023)   PRAPARE - Administrator, Civil Service (Medical): No    Lack of Transportation (Non-Medical): No  Physical Activity: Inactive (06/21/2023)   Exercise Vital Sign    Days of Exercise per Week: 0 days    Minutes of Exercise per Session: 0 min  Stress: No Stress Concern Present (06/21/2023)   [name] of Occupational Health - Occupational Stress Questionnaire    Feeling of Stress : Not at all  Social Connections: Moderately Isolated (06/21/2023)   Social Connection and Isolation Panel [NHANES]    Frequency of Communication with Friends and Family: More than three times a week     Frequency of Social Gatherings with Friends and Family: More than three times a week    Attends Religious Services: More than 4 times per year    Active Member of [employer] or Organizations: No    Attends Banker Meetings: Never    Marital Status: Widowed  Tobacco Counseling Counseling given: Not Answered   Clinical Intake:  Pre-visit preparation completed: Yes  Pain : No/denies pain     Nutritional Risks: None Diabetes: No  How often do you need to have someone help you when you read instructions, pamphlets, or other written materials from your doctor or pharmacy?: 1 - Never  Interpreter Needed?: No  Information entered by :: NAllen LPN   Activities of Daily Living    06/21/2023    1:56 PM  In your present state of health, do you have any difficulty performing the following activities:  Hearing? 1  Comment wears hearing aids  Vision? 1  Comment clogged glands  Difficulty concentrating or making decisions? 0  Walking or climbing stairs? 1  Dressing or bathing? 0  Doing errands, shopping? 1  Comment does not drive  Preparing Food and eating ? N  Using the Toilet? N  In the past six months, have you accidently leaked urine? Y  Comment wears a pad  Do you have problems with loss of bowel control? N  Managing your Medications? N  Managing your Finances? N  Housekeeping or managing your Housekeeping? N    Patient Care Team: Loyola Mast, MD as PCP - General (Family Medicine) Van Clines, MD as Consulting Physician (Neurology) Pete Glatter, Danford Bad., MD as Referring Physician (Urology) Mack Hook., MD (Otolaryngology) Hurshel Party, OD (Optometry) Elmon Else, MD as Consulting Physician (Dermatology) Drema Halon, MD (Inactive) as Consulting Physician (Otolaryngology)  Indicate any recent Medical Services you may have received from other than [facility] providers in the past year (date may be approximate).     Assessment:    This is a routine wellness examination for Josiphine.  Hearing/Vision screen Hearing Screening - Comments:: Has hearing aids that are maintained Vision Screening - Comments:: Regular eye exams, Dr. Carlynn Purl   Goals Addressed             This Visit's Progress    Patient Stated       06/21/2023, maintain current health       Depression Screen    06/21/2023    2:05 PM 05/28/2023   11:18 AM 06/12/2022    1:56 PM 10/09/2021    1:06 PM 04/27/2021   10:07 AM 01/05/2021    1:14 PM  PHQ 2/9 Scores  PHQ - 2 Score 0 0 0 0 0 0  PHQ- 9 Score 0         Fall Risk    06/21/2023    2:05 PM 05/28/2023   11:17 AM 06/12/2022    1:53 PM 10/09/2021    1:06 PM 04/27/2021   10:01 AM  Fall Risk   Falls in the past year? 0 0 0 0 0  Number falls in past yr: 0  0 0 0  Injury with Fall? 0  0 0 0  Risk for fall due to : Medication side effect;Impaired vision  No Fall Risks No Fall Risks   Follow up Falls prevention discussed;Falls evaluation completed  Falls prevention discussed Falls evaluation completed Falls evaluation completed;Falls prevention discussed    MEDICARE RISK AT HOME: Medicare Risk at Home Any stairs in or around the home?: Yes If so, are there any without handrails?: No Home free of loose throw rugs in walkways, pet beds, electrical cords, etc?: Yes Adequate lighting in your home to reduce risk of falls?: Yes Life alert?: No Use of a cane, walker or w/c?: No Grab bars in the  bathroom?: Yes Shower chair or bench in shower?: Yes Elevated toilet seat or a handicapped toilet?: Yes  TIMED UP AND GO:  Was the test performed?  No    Cognitive Function:        06/21/2023    2:06 PM 06/12/2022    1:57 PM  6CIT Screen  What Year? 0 points 0 points  What month? 0 points 0 points  What time? 0 points 0 points  Count back from 20 0 points 0 points  Months in reverse 2 points 0 points  Repeat phrase 2 points 0 points  Total Score 4 points 0 points     Immunizations Immunization History  Administered Date(s) Administered   PFIZER(Purple Top)SARS-COV-2 Vaccination 10/06/2019, 11/10/2019   Tdap 02/10/2022    TDAP status: Due, Education has been provided regarding the importance of this vaccine. Advised may receive this vaccine at local pharmacy or Health Dept. Aware to provide a copy of the vaccination record if obtained from local pharmacy or Health Dept. Verbalized acceptance and understanding.  Flu Vaccine status: Declined, Education has been provided regarding the importance of this vaccine but patient still declined. Advised may receive this vaccine at local pharmacy or Health Dept. Aware to provide a copy of the vaccination record if obtained from local pharmacy or Health Dept. Verbalized acceptance and understanding.  Pneumococcal vaccine status: Declined,  Education has been provided regarding the importance of this vaccine but patient still declined. Advised may receive this vaccine at local pharmacy or Health Dept. Aware to provide a copy of the vaccination record if obtained from local pharmacy or Health Dept. Verbalized acceptance and understanding.   Covid-19 vaccine status: Information provided on how to obtain vaccines.   Qualifies for Shingles Vaccine? Yes   Zostavax completed No   Shingrix Completed?: No.    Education has been provided regarding the importance of this vaccine. Patient has been advised to call insurance company to determine out of pocket expense if they have not yet received this vaccine. Advised may also receive vaccine at local pharmacy or Health Dept. Verbalized acceptance and understanding.  Screening Tests Health Maintenance  Topic Date Due   Zoster Vaccines- Shingrix (1 of 2) Never done   DEXA SCAN  Never done   COVID-19 Vaccine (3 - Pfizer risk series) 12/08/2019   INFLUENZA VACCINE  11/25/2023 (Originally 03/28/2023)   Pneumonia Vaccine 78+ Years old (1 of 1 - PCV) 06/20/2024 (Originally  08/05/2001)   Medicare Annual Wellness (AWV)  06/20/2024   DTaP/Tdap/Td (2 - Td or Tdap) 02/11/2032   HPV VACCINES  Aged Out    Health Maintenance  Health Maintenance Due  Topic Date Due   Zoster Vaccines- Shingrix (1 of 2) Never done   DEXA SCAN  Never done   COVID-19 Vaccine (3 - Pfizer risk series) 12/08/2019    Colorectal cancer screening: No longer required.   Mammogram status: No longer required due to age.  Bone Density status: will discuss with provider  Lung Cancer Screening: (Low Dose CT Chest recommended if Age 20-80 years, 20 pack-year currently smoking OR have quit w/in 15years.) does not qualify.   Lung Cancer Screening Referral: no  Additional Screening:  Hepatitis C Screening: does not qualify;   Vision Screening: Recommended annual ophthalmology exams for early detection of glaucoma and other disorders of the eye. Is the patient up to date with their annual eye exam?  Yes  Who is the provider or what is the name of the office in which  the patient attends annual eye exams? Dr. Carlynn Purl If pt is not established with a provider, would they like to be referred to a provider to establish care? No .   Dental Screening: Recommended annual dental exams for proper oral hygiene  Diabetic Foot Exam: n/a  Community Resource Referral / Chronic Care Management: CRR required this visit?  No   CCM required this visit?  No     Plan:     I have personally reviewed and noted the following in the patient's chart:   Medical and social history Use of alcohol, tobacco or illicit drugs  Current medications and supplements including opioid prescriptions. Patient is not currently taking opioid prescriptions. Functional ability and status Nutritional status Physical activity Advanced directives List of other physicians Hospitalizations, surgeries, and ER visits in previous 12 months Vitals Screenings to include cognitive, depression, and falls Referrals and  appointments  In addition, I have reviewed and discussed with patient certain preventive protocols, quality metrics, and best practice recommendations. A written personalized care plan for preventive services as well as general preventive health recommendations were provided to patient.     Barb Merino, LPN   [id number]   After Visit Summary: (Pick Up) Due to this being a telephonic visit, with patients personalized plan was offered to patient and patient has requested to Pick up at office.  Nurse Notes: none

## 2023-07-16 DIAGNOSIS — H401111 Primary open-angle glaucoma, right eye, mild stage: Secondary | ICD-10-CM | POA: Diagnosis not present

## 2023-07-16 DIAGNOSIS — Z961 Presence of intraocular lens: Secondary | ICD-10-CM | POA: Diagnosis not present

## 2023-07-16 DIAGNOSIS — H401112 Primary open-angle glaucoma, right eye, moderate stage: Secondary | ICD-10-CM | POA: Diagnosis not present

## 2023-07-16 DIAGNOSIS — H16142 Punctate keratitis, left eye: Secondary | ICD-10-CM | POA: Diagnosis not present

## 2023-07-19 ENCOUNTER — Ambulatory Visit: Payer: Medicare Other | Admitting: Family Medicine

## 2023-07-22 ENCOUNTER — Ambulatory Visit: Payer: Medicare Other | Admitting: Family Medicine

## 2023-08-13 DIAGNOSIS — H02814 Retained foreign body in left upper eyelid: Secondary | ICD-10-CM | POA: Diagnosis not present

## 2023-08-13 DIAGNOSIS — H16142 Punctate keratitis, left eye: Secondary | ICD-10-CM | POA: Diagnosis not present

## 2023-08-13 DIAGNOSIS — H02815 Retained foreign body in left lower eyelid: Secondary | ICD-10-CM | POA: Diagnosis not present

## 2023-08-13 DIAGNOSIS — Z961 Presence of intraocular lens: Secondary | ICD-10-CM | POA: Diagnosis not present

## 2023-10-01 DIAGNOSIS — D225 Melanocytic nevi of trunk: Secondary | ICD-10-CM | POA: Diagnosis not present

## 2023-10-01 DIAGNOSIS — D2261 Melanocytic nevi of right upper limb, including shoulder: Secondary | ICD-10-CM | POA: Diagnosis not present

## 2023-10-01 DIAGNOSIS — D485 Neoplasm of uncertain behavior of skin: Secondary | ICD-10-CM | POA: Diagnosis not present

## 2023-10-01 DIAGNOSIS — D0439 Carcinoma in situ of skin of other parts of face: Secondary | ICD-10-CM | POA: Diagnosis not present

## 2023-10-01 DIAGNOSIS — Z85828 Personal history of other malignant neoplasm of skin: Secondary | ICD-10-CM | POA: Diagnosis not present

## 2023-10-01 DIAGNOSIS — L309 Dermatitis, unspecified: Secondary | ICD-10-CM | POA: Diagnosis not present

## 2023-10-01 DIAGNOSIS — L821 Other seborrheic keratosis: Secondary | ICD-10-CM | POA: Diagnosis not present

## 2023-10-01 DIAGNOSIS — L578 Other skin changes due to chronic exposure to nonionizing radiation: Secondary | ICD-10-CM | POA: Diagnosis not present

## 2023-10-01 DIAGNOSIS — L570 Actinic keratosis: Secondary | ICD-10-CM | POA: Diagnosis not present

## 2023-10-01 DIAGNOSIS — Z808 Family history of malignant neoplasm of other organs or systems: Secondary | ICD-10-CM | POA: Diagnosis not present

## 2023-10-01 DIAGNOSIS — D2239 Melanocytic nevi of other parts of face: Secondary | ICD-10-CM | POA: Diagnosis not present

## 2023-11-15 DIAGNOSIS — L309 Dermatitis, unspecified: Secondary | ICD-10-CM | POA: Diagnosis not present

## 2023-11-15 DIAGNOSIS — D0439 Carcinoma in situ of skin of other parts of face: Secondary | ICD-10-CM | POA: Diagnosis not present

## 2023-11-15 DIAGNOSIS — L570 Actinic keratosis: Secondary | ICD-10-CM | POA: Diagnosis not present

## 2023-11-15 DIAGNOSIS — D225 Melanocytic nevi of trunk: Secondary | ICD-10-CM | POA: Diagnosis not present

## 2023-11-26 ENCOUNTER — Ambulatory Visit: Payer: Medicare Other | Admitting: Family Medicine

## 2023-12-17 ENCOUNTER — Ambulatory Visit: Admitting: Family Medicine

## 2023-12-20 ENCOUNTER — Encounter: Payer: Self-pay | Admitting: Family Medicine

## 2023-12-20 ENCOUNTER — Ambulatory Visit (INDEPENDENT_AMBULATORY_CARE_PROVIDER_SITE_OTHER): Admitting: Family Medicine

## 2023-12-20 VITALS — BP 116/66 | HR 104 | Temp 98.3°F | Ht 59.0 in | Wt 126.6 lb

## 2023-12-20 DIAGNOSIS — C44329 Squamous cell carcinoma of skin of other parts of face: Secondary | ICD-10-CM

## 2023-12-20 DIAGNOSIS — R634 Abnormal weight loss: Secondary | ICD-10-CM | POA: Insufficient documentation

## 2023-12-20 DIAGNOSIS — J301 Allergic rhinitis due to pollen: Secondary | ICD-10-CM | POA: Diagnosis not present

## 2023-12-20 MED ORDER — AZELASTINE HCL 0.1 % NA SOLN: 1.0000 | Freq: Two times a day (BID) | NASAL | 12 refills | Status: DC

## 2023-12-20 NOTE — Assessment & Plan Note (Addendum)
 Surgical wound has been slow to heal. I recommend she use a saline wound spray for cleaning rather than soap. I also encouraged her to increase her protein intake to help improve wound healing.

## 2023-12-20 NOTE — Assessment & Plan Note (Signed)
 Anna [name] has had an 8 lb. (6%) weight loss over the past year. I suspect her lack of physical activity and decreased intake to be an underlying cause for this. I recommend she try and add an additional serving of a protein daily. We discussed possibly adding a protein shake. I also recommend she try and resume some of the home exercises that PT has shown her.

## 2023-12-20 NOTE — Progress Notes (Signed)
 [facility] [address] [place] [address] Dept: [phone] Dept Fax: [phone]  Chronic Care Office Visit  Subjective:    Patient ID: Anna [name], female    DOB: October 29, 1933, 89 y.o..   MRN: [id number]  Chief Complaint  Patient presents with   Follow-up    6 month f/u.   Has questions about ? Diabetic.   History of Present Illness:  Patient is in today for reassessment of chronic medical issues.  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 She states this was "burned and scraped" by Dr. Joanne [name]. She has noted that her healing has been very slow. She is keeping this covered. She has been applying some Vaseline. She changes her dressing about every other day. She finds the wound does bleed when she takes her bandage off. Anna [name] is cleaning this with a mild soap and water. She is worried she might have diabetes.  Anna [name] notes her allergies have been flared more recently. She is currently taking a daily cetirizine. She was previously on Flonase  spray, but stopped this and notes she can't tell that this was doing her any good. She does continue to have runny nose.  Anna [name] daughter has been concerned that her mother is losing weight. Anna [name] notes she feels she needed to lose some weight. She notes she doesn't have as strong of an appetite. She feels she does get adequate protein in her diet, but  this appears to be mostly form dairy products. She is not as active with time, primarily due to chronic low back pain which impacts her ability to ambulate.  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 05/28/2023   Kyphoscoliosis 01/16/2023   Physical deconditioning 01/16/2023   Post herpetic neuralgia 05/07/2022   Seborrheic keratoses  02/14/2022   History of basal cell carcinoma (BCC) 10/09/2021   Basal cell carcinoma (BCC) of dorsum of nose 10/09/2021   Osteoarthritis of multiple joints 01/05/2021   Chronic bilateral low back pain without sciatica 12/07/2019   Idiopathic stabbing headache 11/02/2019   Sensorineural hearing loss (SNHL) 08/07/2018   Idiopathic peripheral neuropathy 01/25/2016   Nephrolithiasis 03/29/2015   History of total knee arthroplasty 01/05/2015   Rosacea 07/14/2014   Degenerative joint disease of cervical and lumbar spine 07/14/2014   Microscopic hematuria 07/14/2014   Mammogram abnormal 07/14/2014   History of breast cancer in female 07/14/2014   Atrophic vaginitis 07/14/2014   Allergic rhinitis 07/14/2014   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   Vitamin D deficiency 05/18/2014   Osteoporosis 05/18/2014   Diverticulosis of large intestine without hemorrhage 05/18/2014   Past Surgical History:  Procedure Laterality Date   ABDOMINAL HYSTERECTOMY     APPENDECTOMY     BACK SURGERY     disc repaired 20 years ago    BREAST SURGERY     double mastectomy    CESAREAN SECTION     times 2   colonscopy      cyst repaired      following mammogram / cyst ruptured had to surgically repair area / left side    DILATION AND CURETTAGE OF UTERUS     EYE SURGERY     bilat cataract surgery    HAND SURGERY     left hand / joint issues with thumb  LUNG SURGERY     related to fatty tumor / left approx 12 years ago    right knee arthroscopy      TOTAL KNEE ARTHROPLASTY Right 01/05/2015   Procedure: RIGHT TOTAL KNEE ARTHROPLASTY;  Surgeon: Anna Lites, MD;  Location: [facility];  Service: Orthopedics;  Laterality: Right;   History reviewed. No pertinent family history. Outpatient Medications Prior to Visit  Medication Sig Dispense Refill   ascorbic acid (VITAMIN C) 500 MG tablet Take 500 mg by mouth daily.     b complex vitamins capsule Take 1 capsule by mouth daily.     cetirizine (ZYRTEC) 10 MG tablet Take 10  mg by mouth every evening.     gabapentin  (NEURONTIN ) 300 MG capsule Take 1 capsule (300 mg total) by mouth at bedtime. 90 capsule 3   latanoprost  (XALATAN ) 0.005 % ophthalmic solution Place 1 drop into both eyes every evening.     Lifitegrast  (XIIDRA ) 5 % SOLN Place 1 drop into both eyes 2 (two) times daily.     Lutein  10 MG TABS Take 1 tablet by mouth daily.     Multiple Vitamins-Minerals (PRESERVISION AREDS 2 PO) Take 1 tablet by mouth in the morning and at bedtime.     Omega-3 1000 MG CAPS Take 1 capsule by mouth daily.     triamcinolone (NASACORT ALLERGY 24HR) 55 MCG/ACT AERO nasal inhaler Place 2 sprays into the nose daily.     No facility-administered medications prior to visit.   Allergies  Allergen Reactions   Demerol [Meperidine] Nausea And Vomiting   Sulfa Antibiotics Nausea And Vomiting   Objective:   Today's Vitals   12/20/23 0818  BP: 116/66  Pulse: (!) 104  Temp: 98.3 F (36.8 C)  TempSrc: Temporal  SpO2: 97%  Weight: 126 lb 9.6 oz (57.4 kg)  Height: 4\' 11"  (1.499 m)   Body mass index is 25.57 kg/m.   General: Well developed, well nourished. No acute distress. Skin: Warm and dry. There is a 7-8 mm round superficial surgical wound of the left temple. The wound base does   ooze a small amount of blood. There is no sign of fibrinous material, exudate, or drainage and no surrounding   erythema. Psych: Alert and oriented. Normal mood and affect.  Health Maintenance Due  Topic Date Due   Zoster Vaccines- Shingrix (1 of 2) Never done   DEXA SCAN  Never done   Assessment & Plan:   Problem List Items Addressed This Visit       Respiratory   Allergic rhinitis   Continue cetirizine 10 mg daily. I will try adding azelastine spray to see if this reduces some of her rhinorrhea.      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 I recommend she use a saline wound spray for cleaning rather than soap. I also encouraged her to increase her protein intake to help improve wound healing.        Other   Weight loss   Anna [name] has had an 8 lb. (6%) weight loss over the past year. I suspect her lack of physical activity and decreased intake to be an underlying cause for this. I recommend she try and add an additional serving of a protein daily. We discussed possibly adding a protein shake. I also recommend she  try and resume some of the home exercises that PT has shown her.       Return in about 6 months (around 06/20/2024) for Reassessment.   Graig Lawyer, MD

## 2023-12-20 NOTE — Assessment & Plan Note (Signed)
 Continue cetirizine 10 mg daily. I will try adding azelastine spray to see if this reduces some of her rhinorrhea.

## 2024-03-29 ENCOUNTER — Telehealth: Admitting: Nurse Practitioner

## 2024-03-29 DIAGNOSIS — U071 COVID-19: Secondary | ICD-10-CM

## 2024-03-29 MED ORDER — NIRMATRELVIR/RITONAVIR (PAXLOVID)TABLET
3.0000 | ORAL_TABLET | Freq: Two times a day (BID) | ORAL | 0 refills | Status: AC
Start: 2024-03-29 — End: 2024-04-03

## 2024-03-29 NOTE — Progress Notes (Signed)
 Virtual Visit Consent   Anna [name], you are scheduled for a virtual visit with a [facility] provider today. Just as with appointments in the office, your consent must be obtained to participate. Your consent will be active for this visit and any virtual visit you may have with one of our providers in the next 365 days. If you have a MyChart account, a copy of this consent can be sent to you electronically.  As this is a virtual visit, video technology does not allow for your provider to perform a traditional examination. This may limit your provider's ability to fully assess your condition. If your provider identifies any concerns that need to be evaluated in person or the need to arrange testing (such as labs, EKG, etc.), we will make arrangements to do so. Although advances in technology are sophisticated, we cannot ensure that it will always work on either your end or our end. If the connection with a video visit is poor, the visit may have to be switched to a telephone visit. With either a video or telephone visit, we are not always able to ensure that we have a secure connection.  By engaging in this virtual visit, you consent to the provision of healthcare and authorize for your insurance to be billed (if applicable) for the services provided during this visit. Depending on your insurance coverage, you may receive a charge related to this service.  I need to obtain your verbal consent now. Are you willing to proceed with your visit today? Anna [name] has provided verbal consent on 03/29/2024 for a virtual visit (video or telephone). Anna LELON Servant, NP  Date: 03/29/2024 2:31 PM   Virtual Visit via Video Note   I, Anna [name], connected with  Anna [name]  ([id number], Mar 01, 1934) on 03/29/24 at  2:30 PM EDT by a video-enabled telemedicine application and verified that I am speaking with the correct person using two identifiers.  Location: Patient: Virtual Visit  Location Patient: Home Provider: Virtual Visit Location Provider: Home Office   I discussed the limitations of evaluation and management by telemedicine and the availability of in person appointments. The patient expressed understanding and agreed to proceed.    History of Present Illness: Anna [name] is a 89 y.o. who identifies as a female who was assigned female at birth, and is being seen today for COVID positive   Anna [name] tested positive for COVID today. She is currently experiencing symptoms of cough and nasal congestion with increased mucous production, sore throat, sneezing and weakness. Denies fever, headache or shortness of breath. Symptoms onset was 24 hours ago.   Problems: There are no active problems to display for this patient.   Allergies: Not on File Medications:  Current Outpatient Medications:    nirmatrelvir /ritonavir  (PAXLOVID ) 20 x 150 MG & 10 x 100MG  TABS, Take 3 tablets by mouth 2 (two) times daily for 5 days. (Take nirmatrelvir  150 mg two tablets twice daily for 5 days and ritonavir  100 mg one tablet twice daily for 5 days) Patient GFR is >60, Disp: 30 tablet, Rfl: 0  Observations/Objective: Patient is well-developed, well-nourished in no acute distress.  Resting comfortably at home.  Head is normocephalic, atraumatic.  No labored breathing. Speech is clear and coherent with logical content.  Patient is alert and oriented at baseline.    Assessment and Plan: 1. Positive self-administered antigen test for COVID-19 (Primary) - nirmatrelvir /ritonavir  (PAXLOVID ) 20 x 150 MG & 10 x 100MG  TABS; Take  3 tablets by mouth 2 (two) times daily for 5 days. (Take nirmatrelvir  150 mg two tablets twice daily for 5 days and ritonavir  100 mg one tablet twice daily for 5 days) Patient GFR is >60  Dispense: 30 tablet; Refill: 0   Please keep well-hydrated and get plenty of rest. Start a saline nasal rinse to flush out your nasal passages. You can use plain Mucinex  to help thin congestion. If you have a humidifier, you can use this daily as needed.    You are to wear a mask for 5 days from onset of your symptoms.  After day 5, if you have had no fever and you are feeling better with NO symptoms, you can end masking. Keep in mind you can be contagious 10 days from the onset of symptoms  After day 5 if you have a fever or are having significant symptoms, please wear your mask for full 10 days.   If you note any worsening of symptoms, any significant shortness of breath or any chest pain, please seek ER evaluation ASAP.  Please do not delay care!    If you note any worsening of symptoms, any significant shortness of breath or any chest pain, please seek ER evaluation ASAP.  Please do not delay care!   Follow Up Instructions: I discussed the assessment and treatment plan with the patient. The patient was provided an opportunity to ask questions and all were answered. The patient agreed with the plan and demonstrated an understanding of the instructions.  A copy of instructions were sent to the patient via MyChart unless otherwise noted below.    The patient was advised to call back or seek an in-person evaluation if the symptoms worsen or if the condition fails to improve as anticipated.    Anna Lesniewski W Yazhini Mcaulay, NP

## 2024-03-29 NOTE — Patient Instructions (Signed)
 Hargis Levorn Rattler, thank you for joining Haze LELON Servant, NP for today's virtual visit.  While this provider is not your primary care provider (PCP), if your PCP is located in our provider database this encounter information will be shared with them immediately following your visit.   A [facility] MyChart account gives you access to today's visit and all your visits, tests, and labs performed at [facility]  click here if you don't have a [facility] MyChart account or go to [email or web address]  Consent: (Patient) Anna [name] provided verbal consent for this virtual visit at the beginning of the encounter.  Current Medications:  Current Outpatient Medications:    nirmatrelvir /ritonavir  (PAXLOVID ) 20 x 150 MG & 10 x 100MG  TABS, Take 3 tablets by mouth 2 (two) times daily for 5 days. (Take nirmatrelvir  150 mg two tablets twice daily for 5 days and ritonavir  100 mg one tablet twice daily for 5 days) Patient GFR is >60, Disp: 30 tablet, Rfl: 0   Medications ordered in this encounter:  Meds ordered this encounter  Medications   nirmatrelvir /ritonavir  (PAXLOVID ) 20 x 150 MG & 10 x 100MG  TABS    Sig: Take 3 tablets by mouth 2 (two) times daily for 5 days. (Take nirmatrelvir  150 mg two tablets twice daily for 5 days and ritonavir  100 mg one tablet twice daily for 5 days) Patient GFR is >60    Dispense:  30 tablet    Refill:  0    Supervising Provider:   BLAISE ALEENE KIDD [id number]     *If you need refills on other medications prior to your next appointment, please contact your pharmacy*  Follow-Up: Call back or seek an in-person evaluation if the symptoms worsen or if the condition fails to improve as anticipated.  [facility] [phone]  Other Instructions  Please keep well-hydrated and get plenty of rest. Start a saline nasal rinse to flush out your nasal passages. You can use plain Mucinex to help thin congestion. If you have a humidifier,  you can use this daily as needed.    You are to wear a mask for 5 days from onset of your symptoms.  After day 5, if you have had no fever and you are feeling better with NO symptoms, you can end masking. Keep in mind you can be contagious 10 days from the onset of symptoms  After day 5 if you have a fever or are having significant symptoms, please wear your mask for full 10 days.   If you note any worsening of symptoms, any significant shortness of breath or any chest pain, please seek ER evaluation ASAP.  Please do not delay care!    If you note any worsening of symptoms, any significant shortness of breath or any chest pain, please seek ER evaluation ASAP.  Please do not delay care!    If you have been instructed to have an in-person evaluation today at a local Urgent Care facility, please use the link below. It will take you to a list of all of our available [facility], including address, phone number and hours of operation. Please do not delay care.  [facility]  If you or a family member do not have a primary care provider, use the link below to schedule a visit and establish care. When you choose a  primary care physician or advanced practice provider, you gain a long-term partner in health. Find a Primary Care Provider  Learn more about [facility]'s in-office and virtual care options: [facility] - Get Care Now

## 2024-03-30 ENCOUNTER — Encounter: Payer: Self-pay | Admitting: Family Medicine

## 2024-04-07 DIAGNOSIS — D225 Melanocytic nevi of trunk: Secondary | ICD-10-CM | POA: Diagnosis not present

## 2024-04-07 DIAGNOSIS — L578 Other skin changes due to chronic exposure to nonionizing radiation: Secondary | ICD-10-CM | POA: Diagnosis not present

## 2024-04-07 DIAGNOSIS — D2261 Melanocytic nevi of right upper limb, including shoulder: Secondary | ICD-10-CM | POA: Diagnosis not present

## 2024-04-07 DIAGNOSIS — L658 Other specified nonscarring hair loss: Secondary | ICD-10-CM | POA: Diagnosis not present

## 2024-04-07 DIAGNOSIS — D2239 Melanocytic nevi of other parts of face: Secondary | ICD-10-CM | POA: Diagnosis not present

## 2024-04-07 DIAGNOSIS — L219 Seborrheic dermatitis, unspecified: Secondary | ICD-10-CM | POA: Diagnosis not present

## 2024-04-14 ENCOUNTER — Ambulatory Visit (INDEPENDENT_AMBULATORY_CARE_PROVIDER_SITE_OTHER): Admitting: Family Medicine

## 2024-04-14 ENCOUNTER — Encounter: Payer: Self-pay | Admitting: Family Medicine

## 2024-04-14 ENCOUNTER — Ambulatory Visit: Payer: Self-pay | Admitting: Family Medicine

## 2024-04-14 VITALS — BP 126/66 | HR 90 | Temp 97.2°F | Ht 59.0 in | Wt 126.2 lb

## 2024-04-14 DIAGNOSIS — T07XXXA Unspecified multiple injuries, initial encounter: Secondary | ICD-10-CM

## 2024-04-14 DIAGNOSIS — W19XXXA Unspecified fall, initial encounter: Secondary | ICD-10-CM

## 2024-04-14 DIAGNOSIS — R531 Weakness: Secondary | ICD-10-CM

## 2024-04-14 LAB — CBC
HCT: 37.1 % (ref 36.0–46.0)
Hemoglobin: 12.4 g/dL (ref 12.0–15.0)
MCHC: 33.3 g/dL (ref 30.0–36.0)
MCV: 90.3 fl (ref 78.0–100.0)
Platelets: 285 K/uL (ref 150.0–400.0)
RBC: 4.11 Mil/uL (ref 3.87–5.11)
RDW: 13 % (ref 11.5–15.5)
WBC: 6.3 K/uL (ref 4.0–10.5)

## 2024-04-14 LAB — BASIC METABOLIC PANEL WITH GFR
BUN: 12 mg/dL (ref 6–23)
CO2: 26 meq/L (ref 19–32)
Calcium: 9.2 mg/dL (ref 8.4–10.5)
Chloride: 105 meq/L (ref 96–112)
Creatinine, Ser: 0.69 mg/dL (ref 0.40–1.20)
GFR: 77.89 mL/min (ref >60.00)
Glucose, Bld: 109 mg/dL — ABNORMAL HIGH (ref 70–99)
Potassium: 4.1 meq/L (ref 3.5–5.1)
Sodium: 140 meq/L (ref 135–145)

## 2024-04-14 LAB — TSH: TSH: 1.82 u[IU]/mL (ref 0.35–5.50)

## 2024-04-14 NOTE — Progress Notes (Signed)
 [facility] [address] [place] [address] Dept: [phone] Dept Fax: [phone]  Office Visit  Subjective:    Patient ID: Anna [name], female    DOB: 07/15/1934, 89 y.o..   MRN: [id number]  Chief Complaint  Patient presents with   Fatigue    C/o having extreme fatigue. Had covid 2 weeks ago.    History of Present Illness:  Patient is in today for reassessment due to complaints of severe fatigue. Ms. Detter had a video visit on 8/3, having complaints of cough, nasal congestion with marked rhinorrhea and post-nasal drip, sore throat sneezing and weakness. She had tested positive at home for COVID-19. Her granddaughter was positive at the same time. She was treated with a course of Paxlovid. She notes that most of these symptoms have resolved at this point. However, she is still having severe fatigue and episodes of orthostasis. She recently had a fall at home while going to sit on the toilet. She fell forward, into a laundry hamper. She did note some low back pain at the time, but this is improving. She has also had several bruises. She is trying to improve her hydration and her granddaughter is giving her some electrolytes into her water.  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 05/28/2023   Kyphoscoliosis 01/16/2023   Physical deconditioning 01/16/2023   Post herpetic neuralgia 05/07/2022   Seborrheic keratoses 02/14/2022   History of basal cell carcinoma (BCC) 10/09/2021   Basal cell carcinoma (BCC) of dorsum of nose 10/09/2021   Osteoarthritis of multiple joints 01/05/2021   Chronic bilateral low back pain without sciatica 12/07/2019   Idiopathic stabbing headache 11/02/2019   Sensorineural hearing loss (SNHL) 08/07/2018   Idiopathic peripheral neuropathy 01/25/2016   Nephrolithiasis 03/29/2015    History of total knee arthroplasty 01/05/2015   Rosacea 07/14/2014   Degenerative joint disease of cervical and lumbar spine 07/14/2014   Microscopic hematuria 07/14/2014   Mammogram abnormal 07/14/2014   History of breast cancer in female 07/14/2014   Atrophic vaginitis 07/14/2014   Allergic rhinitis 07/14/2014   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   Vitamin D deficiency 05/18/2014   Osteoporosis 05/18/2014   Diverticulosis of large intestine without hemorrhage 05/18/2014   Past Surgical History:  Procedure Laterality Date   ABDOMINAL HYSTERECTOMY     APPENDECTOMY     BACK SURGERY     disc repaired 20 years ago    BREAST SURGERY     double mastectomy    CESAREAN SECTION     times 2   colonscopy      cyst repaired      following mammogram / cyst ruptured had to surgically repair area / left side    DILATION AND CURETTAGE OF UTERUS     EYE SURGERY     bilat cataract surgery    HAND SURGERY     left hand / joint issues with thumb    LUNG SURGERY     related to fatty tumor / left approx 12 years ago    right knee arthroscopy      TOTAL KNEE ARTHROPLASTY Right 01/05/2015   Procedure: RIGHT TOTAL KNEE ARTHROPLASTY;  Surgeon: Tanda Heading, MD;  Location: [facility];  Service: Orthopedics;  Laterality: Right;   History reviewed. No pertinent family history. Outpatient Medications Prior to Visit  Medication Sig Dispense Refill   ascorbic  acid (VITAMIN C) 500 MG tablet Take 500 mg by mouth daily.     azelastine  (ASTELIN ) 0.1 % nasal spray Place 1 spray into both nostrils 2 (two) times daily. Use in each nostril as directed 30 mL 12   b complex vitamins capsule Take 1 capsule by mouth daily.     cetirizine (ZYRTEC) 10 MG tablet Take 10 mg by mouth every evening.     gabapentin  (NEURONTIN ) 300 MG capsule Take 1 capsule (300 mg total) by mouth at bedtime. 90 capsule 3   latanoprost  (XALATAN ) 0.005 % ophthalmic solution Place 1 drop into both eyes every evening.     Lifitegrast  (XIIDRA ) 5 % SOLN  Place 1 drop into both eyes 2 (two) times daily.     Lutein  10 MG TABS Take 1 tablet by mouth daily.     Multiple Vitamins-Minerals (PRESERVISION AREDS 2 PO) Take 1 tablet by mouth in the morning and at bedtime.     Omega-3 1000 MG CAPS Take 1 capsule by mouth daily.     triamcinolone (NASACORT ALLERGY 24HR) 55 MCG/ACT AERO nasal inhaler Place 2 sprays into the nose daily.     No facility-administered medications prior to visit.   Allergies  Allergen Reactions   Demerol [Meperidine] Nausea And Vomiting   Sulfa Antibiotics Nausea And Vomiting     Objective:   Today's Vitals   04/14/24 1341  BP: 126/66  Pulse: 90  Temp: (!) 97.2 F (36.2 C)  TempSrc: Temporal  SpO2: 98%  Weight: 126 lb 3.2 oz (57.2 kg)  Height: 4' 11 (1.499 m)   Body mass index is 25.49 kg/m.   General: Well developed, well nourished. No acute distress. Lungs: Clear to auscultation bilaterally. No wheezing, rales or rhonchi. CV: RRR without murmurs or rubs. Pulses 2+ bilaterally. Extremities: Multiple bruises on the left hand, right wrist, and both lower extremities. No significant pain on   palpation or deformity of extremities.  Psych: Alert and oriented. Normal mood and affect.  Health Maintenance Due  Topic Date Due   DEXA SCAN  Never done     Assessment & Plan:   Problem List Items Addressed This Visit   None Visit Diagnoses       Generalized weakness    -  Primary   Likely a result of long-COVID. I recommend she get adequate rest, hydrate well, and give this more time to resolve.   Relevant Orders   Basic metabolic panel with GFR (Completed)   CBC (Completed)   TSH (Completed)     Multiple contusions       These shoudl resolve with expectant management. No sign of a fracture and I do not believe an x-ray is indicated.     Fall, initial encounter       Appears to have been due to after effects of COVID.       Return if symptoms worsen or fail to improve.   Garnette CHRISTELLA Simpler, MD

## 2024-04-28 ENCOUNTER — Telehealth: Payer: Self-pay | Admitting: Family Medicine

## 2024-04-29 NOTE — Telephone Encounter (Signed)
 error

## 2024-06-04 DIAGNOSIS — H6121 Impacted cerumen, right ear: Secondary | ICD-10-CM | POA: Diagnosis not present

## 2024-06-04 DIAGNOSIS — Z9622 Myringotomy tube(s) status: Secondary | ICD-10-CM | POA: Diagnosis not present

## 2024-06-10 ENCOUNTER — Emergency Department (HOSPITAL_BASED_OUTPATIENT_CLINIC_OR_DEPARTMENT_OTHER)

## 2024-06-10 ENCOUNTER — Inpatient Hospital Stay (HOSPITAL_BASED_OUTPATIENT_CLINIC_OR_DEPARTMENT_OTHER)
Admission: EM | Admit: 2024-06-10 | Discharge: 2024-06-27 | DRG: 853 | Disposition: E | Attending: Internal Medicine | Admitting: Internal Medicine

## 2024-06-10 ENCOUNTER — Encounter (HOSPITAL_BASED_OUTPATIENT_CLINIC_OR_DEPARTMENT_OTHER): Payer: Self-pay | Admitting: Emergency Medicine

## 2024-06-10 ENCOUNTER — Other Ambulatory Visit: Payer: Self-pay

## 2024-06-10 DIAGNOSIS — N3001 Acute cystitis with hematuria: Secondary | ICD-10-CM | POA: Diagnosis not present

## 2024-06-10 DIAGNOSIS — R918 Other nonspecific abnormal finding of lung field: Secondary | ICD-10-CM | POA: Diagnosis not present

## 2024-06-10 DIAGNOSIS — Z66 Do not resuscitate: Secondary | ICD-10-CM | POA: Diagnosis present

## 2024-06-10 DIAGNOSIS — I447 Left bundle-branch block, unspecified: Secondary | ICD-10-CM | POA: Diagnosis present

## 2024-06-10 DIAGNOSIS — C4492 Squamous cell carcinoma of skin, unspecified: Secondary | ICD-10-CM | POA: Diagnosis present

## 2024-06-10 DIAGNOSIS — D649 Anemia, unspecified: Secondary | ICD-10-CM | POA: Diagnosis not present

## 2024-06-10 DIAGNOSIS — D6959 Other secondary thrombocytopenia: Secondary | ICD-10-CM | POA: Diagnosis present

## 2024-06-10 DIAGNOSIS — Z1152 Encounter for screening for COVID-19: Secondary | ICD-10-CM

## 2024-06-10 DIAGNOSIS — R627 Adult failure to thrive: Secondary | ICD-10-CM | POA: Diagnosis present

## 2024-06-10 DIAGNOSIS — Z885 Allergy status to narcotic agent status: Secondary | ICD-10-CM

## 2024-06-10 DIAGNOSIS — R6520 Severe sepsis without septic shock: Secondary | ICD-10-CM | POA: Diagnosis not present

## 2024-06-10 DIAGNOSIS — A498 Other bacterial infections of unspecified site: Secondary | ICD-10-CM

## 2024-06-10 DIAGNOSIS — Z882 Allergy status to sulfonamides status: Secondary | ICD-10-CM

## 2024-06-10 DIAGNOSIS — N201 Calculus of ureter: Secondary | ICD-10-CM | POA: Diagnosis not present

## 2024-06-10 DIAGNOSIS — M150 Primary generalized (osteo)arthritis: Secondary | ICD-10-CM | POA: Diagnosis not present

## 2024-06-10 DIAGNOSIS — Z515 Encounter for palliative care: Secondary | ICD-10-CM | POA: Diagnosis not present

## 2024-06-10 DIAGNOSIS — A419 Sepsis, unspecified organism: Principal | ICD-10-CM | POA: Diagnosis present

## 2024-06-10 DIAGNOSIS — Z8616 Personal history of COVID-19: Secondary | ICD-10-CM

## 2024-06-10 DIAGNOSIS — G9341 Metabolic encephalopathy: Secondary | ICD-10-CM | POA: Diagnosis present

## 2024-06-10 DIAGNOSIS — R6521 Severe sepsis with septic shock: Secondary | ICD-10-CM | POA: Diagnosis not present

## 2024-06-10 DIAGNOSIS — I493 Ventricular premature depolarization: Secondary | ICD-10-CM | POA: Diagnosis present

## 2024-06-10 DIAGNOSIS — Z853 Personal history of malignant neoplasm of breast: Secondary | ICD-10-CM

## 2024-06-10 DIAGNOSIS — I2510 Atherosclerotic heart disease of native coronary artery without angina pectoris: Secondary | ICD-10-CM | POA: Diagnosis not present

## 2024-06-10 DIAGNOSIS — N1330 Unspecified hydronephrosis: Secondary | ICD-10-CM

## 2024-06-10 DIAGNOSIS — Z79899 Other long term (current) drug therapy: Secondary | ICD-10-CM | POA: Diagnosis not present

## 2024-06-10 DIAGNOSIS — I491 Atrial premature depolarization: Secondary | ICD-10-CM | POA: Diagnosis present

## 2024-06-10 DIAGNOSIS — J690 Pneumonitis due to inhalation of food and vomit: Secondary | ICD-10-CM | POA: Diagnosis present

## 2024-06-10 DIAGNOSIS — E876 Hypokalemia: Secondary | ICD-10-CM | POA: Diagnosis present

## 2024-06-10 DIAGNOSIS — N281 Cyst of kidney, acquired: Secondary | ICD-10-CM | POA: Diagnosis not present

## 2024-06-10 DIAGNOSIS — Z9013 Acquired absence of bilateral breasts and nipples: Secondary | ICD-10-CM

## 2024-06-10 DIAGNOSIS — M159 Polyosteoarthritis, unspecified: Secondary | ICD-10-CM | POA: Diagnosis present

## 2024-06-10 DIAGNOSIS — N1832 Chronic kidney disease, stage 3b: Secondary | ICD-10-CM | POA: Diagnosis present

## 2024-06-10 DIAGNOSIS — N200 Calculus of kidney: Secondary | ICD-10-CM

## 2024-06-10 DIAGNOSIS — I9581 Postprocedural hypotension: Secondary | ICD-10-CM | POA: Diagnosis not present

## 2024-06-10 DIAGNOSIS — K5730 Diverticulosis of large intestine without perforation or abscess without bleeding: Secondary | ICD-10-CM | POA: Diagnosis not present

## 2024-06-10 DIAGNOSIS — N138 Other obstructive and reflux uropathy: Secondary | ICD-10-CM | POA: Diagnosis not present

## 2024-06-10 DIAGNOSIS — N132 Hydronephrosis with renal and ureteral calculous obstruction: Secondary | ICD-10-CM | POA: Diagnosis not present

## 2024-06-10 DIAGNOSIS — Z711 Person with feared health complaint in whom no diagnosis is made: Secondary | ICD-10-CM | POA: Diagnosis not present

## 2024-06-10 DIAGNOSIS — C44329 Squamous cell carcinoma of skin of other parts of face: Secondary | ICD-10-CM | POA: Diagnosis present

## 2024-06-10 DIAGNOSIS — Z96651 Presence of right artificial knee joint: Secondary | ICD-10-CM | POA: Diagnosis present

## 2024-06-10 DIAGNOSIS — E872 Acidosis, unspecified: Secondary | ICD-10-CM | POA: Diagnosis present

## 2024-06-10 DIAGNOSIS — J9811 Atelectasis: Secondary | ICD-10-CM | POA: Diagnosis not present

## 2024-06-10 DIAGNOSIS — D6489 Other specified anemias: Secondary | ICD-10-CM | POA: Diagnosis present

## 2024-06-10 DIAGNOSIS — I5031 Acute diastolic (congestive) heart failure: Secondary | ICD-10-CM | POA: Diagnosis not present

## 2024-06-10 DIAGNOSIS — M419 Scoliosis, unspecified: Secondary | ICD-10-CM | POA: Diagnosis present

## 2024-06-10 DIAGNOSIS — N179 Acute kidney failure, unspecified: Secondary | ICD-10-CM | POA: Diagnosis present

## 2024-06-10 DIAGNOSIS — Z7189 Other specified counseling: Secondary | ICD-10-CM | POA: Diagnosis not present

## 2024-06-10 DIAGNOSIS — R0602 Shortness of breath: Secondary | ICD-10-CM | POA: Diagnosis not present

## 2024-06-10 DIAGNOSIS — I771 Stricture of artery: Secondary | ICD-10-CM | POA: Diagnosis not present

## 2024-06-10 DIAGNOSIS — Z90710 Acquired absence of both cervix and uterus: Secondary | ICD-10-CM

## 2024-06-10 DIAGNOSIS — N136 Pyonephrosis: Secondary | ICD-10-CM | POA: Diagnosis present

## 2024-06-10 DIAGNOSIS — I509 Heart failure, unspecified: Secondary | ICD-10-CM | POA: Diagnosis not present

## 2024-06-10 DIAGNOSIS — B9689 Other specified bacterial agents as the cause of diseases classified elsewhere: Secondary | ICD-10-CM | POA: Diagnosis present

## 2024-06-10 DIAGNOSIS — J9601 Acute respiratory failure with hypoxia: Secondary | ICD-10-CM | POA: Diagnosis not present

## 2024-06-10 DIAGNOSIS — R0603 Acute respiratory distress: Secondary | ICD-10-CM | POA: Diagnosis not present

## 2024-06-10 DIAGNOSIS — R296 Repeated falls: Secondary | ICD-10-CM | POA: Diagnosis present

## 2024-06-10 DIAGNOSIS — R7989 Other specified abnormal findings of blood chemistry: Secondary | ICD-10-CM | POA: Diagnosis not present

## 2024-06-10 DIAGNOSIS — J849 Interstitial pulmonary disease, unspecified: Secondary | ICD-10-CM | POA: Diagnosis not present

## 2024-06-10 DIAGNOSIS — I21A1 Myocardial infarction type 2: Secondary | ICD-10-CM | POA: Diagnosis present

## 2024-06-10 DIAGNOSIS — I700 Atherosclerosis of aorta: Secondary | ICD-10-CM | POA: Diagnosis not present

## 2024-06-10 DIAGNOSIS — R5381 Other malaise: Secondary | ICD-10-CM | POA: Diagnosis present

## 2024-06-10 DIAGNOSIS — J90 Pleural effusion, not elsewhere classified: Secondary | ICD-10-CM | POA: Diagnosis not present

## 2024-06-10 LAB — URINALYSIS, W/ REFLEX TO CULTURE (INFECTION SUSPECTED)
Bilirubin Urine: NEGATIVE
Glucose, UA: NEGATIVE mg/dL
Ketones, ur: NEGATIVE mg/dL
Nitrite: NEGATIVE
Protein, ur: NEGATIVE mg/dL
Specific Gravity, Urine: 1.02 (ref 1.005–1.030)
pH: 6.5 (ref 5.0–8.0)

## 2024-06-10 LAB — CBC WITH DIFFERENTIAL/PLATELET
Abs Immature Granulocytes: 0 K/uL (ref 0.00–0.07)
Basophils Absolute: 0 K/uL (ref 0.0–0.1)
Basophils Relative: 0 %
Eosinophils Absolute: 0 K/uL (ref 0.0–0.5)
Eosinophils Relative: 0 %
HCT: 43.1 % (ref 36.0–46.0)
Hemoglobin: 13.9 g/dL (ref 12.0–15.0)
Immature Granulocytes: 0 %
Lymphocytes Relative: 8 %
Lymphs Abs: 0.3 K/uL — ABNORMAL LOW (ref 0.7–4.0)
MCH: 29.6 pg (ref 26.0–34.0)
MCHC: 32.3 g/dL (ref 30.0–36.0)
MCV: 91.9 fL (ref 80.0–100.0)
Monocytes Absolute: 0 K/uL — ABNORMAL LOW (ref 0.1–1.0)
Monocytes Relative: 0 %
Neutro Abs: 3.2 K/uL (ref 1.7–7.7)
Neutrophils Relative %: 92 %
Platelets: 180 K/uL (ref 150–400)
RBC: 4.69 MIL/uL (ref 3.87–5.11)
RDW: 13.8 % (ref 11.5–15.5)
WBC: 3.5 K/uL — ABNORMAL LOW (ref 4.0–10.5)
nRBC: 0 % (ref 0.0–0.2)

## 2024-06-10 LAB — LACTIC ACID, PLASMA
Lactic Acid, Venous: 4.8 mmol/L (ref 0.5–1.9)
Lactic Acid, Venous: 4.1 mmol/L (ref 0.5–1.9)

## 2024-06-10 LAB — COMPREHENSIVE METABOLIC PANEL WITH GFR
ALT: 18 U/L (ref 0–44)
AST: 31 U/L (ref 15–41)
Albumin: 3.9 g/dL (ref 3.5–5.0)
Alkaline Phosphatase: 110 U/L (ref 38–126)
Anion gap: 16 — ABNORMAL HIGH (ref 5–15)
BUN: 16 mg/dL (ref 8–23)
CO2: 22 mmol/L (ref 22–32)
Calcium: 9.7 mg/dL (ref 8.9–10.3)
Chloride: 105 mmol/L (ref 98–111)
Creatinine, Ser: 0.88 mg/dL (ref 0.44–1.00)
GFR, Estimated: >60 mL/min (ref >60)
Glucose, Bld: 133 mg/dL — ABNORMAL HIGH (ref 70–99)
Potassium: 3.8 mmol/L (ref 3.5–5.1)
Sodium: 142 mmol/L (ref 135–145)
Total Bilirubin: 0.8 mg/dL (ref 0.0–1.2)
Total Protein: 6.5 g/dL (ref 6.5–8.1)

## 2024-06-10 LAB — RESP PANEL BY RT-PCR (RSV, FLU A&B, COVID)  RVPGX2
Influenza A by PCR: NEGATIVE
Influenza B by PCR: NEGATIVE
Resp Syncytial Virus by PCR: NEGATIVE
SARS Coronavirus 2 by RT PCR: NEGATIVE

## 2024-06-10 LAB — PROTIME-INR
INR: 1 (ref 0.8–1.2)
Prothrombin Time: 14.1 s (ref 11.4–15.2)

## 2024-06-10 LAB — TROPONIN T, HIGH SENSITIVITY
Troponin T High Sensitivity: 29 ng/L — ABNORMAL HIGH (ref 0–19)
Troponin T High Sensitivity: 158 ng/L (ref 0–19)

## 2024-06-10 MED ORDER — ASPIRIN 81 MG PO CHEW
324.0000 mg | CHEWABLE_TABLET | Freq: Once | ORAL | Status: AC
  Administered 2024-06-10: 324 mg via ORAL
  Filled 2024-06-10: qty 4

## 2024-06-10 MED ORDER — IOHEXOL 300 MG/ML  SOLN
100.0000 mL | Freq: Once | INTRAMUSCULAR | Status: AC | PRN
Start: 2024-06-10 — End: 2024-06-10
  Administered 2024-06-10: 100 mL via INTRAVENOUS

## 2024-06-10 MED ORDER — LACTATED RINGERS IV SOLN: INTRAVENOUS | Status: DC

## 2024-06-10 MED ORDER — ACETAMINOPHEN 500 MG PO TABS
1000.0000 mg | ORAL_TABLET | Freq: Once | ORAL | Status: AC
  Administered 2024-06-10: 1000 mg via ORAL
  Filled 2024-06-10: qty 2

## 2024-06-10 MED ORDER — PIPERACILLIN-TAZOBACTAM 3.375 G IVPB 30 MIN
3.3750 g | Freq: Three times a day (TID) | INTRAVENOUS | Status: DC
  Administered 2024-06-11: 3.375 g via INTRAVENOUS
  Filled 2024-06-10: qty 50

## 2024-06-10 MED ORDER — SODIUM CHLORIDE 0.9 % IV SOLN
500.0000 mg | Freq: Once | INTRAVENOUS | Status: AC
  Administered 2024-06-10: 500 mg via INTRAVENOUS
  Filled 2024-06-10: qty 5

## 2024-06-10 MED ORDER — OXYCODONE HCL 5 MG PO TABS: 5.0000 mg | ORAL_TABLET | Freq: Four times a day (QID) | ORAL | Status: DC | PRN

## 2024-06-10 MED ORDER — SODIUM CHLORIDE 0.9 % IV BOLUS
1000.0000 mL | Freq: Once | INTRAVENOUS | Status: AC
  Administered 2024-06-10: 1000 mL via INTRAVENOUS

## 2024-06-10 MED ORDER — PIPERACILLIN-TAZOBACTAM 3.375 G IVPB 30 MIN
3.3750 g | Freq: Once | INTRAVENOUS | Status: AC
  Administered 2024-06-10: 3.375 g via INTRAVENOUS
  Filled 2024-06-10: qty 50

## 2024-06-10 MED ORDER — ACETAMINOPHEN 325 MG PO TABS
650.0000 mg | ORAL_TABLET | Freq: Four times a day (QID) | ORAL | Status: DC | PRN
  Administered 2024-06-13 – 2024-06-15 (×5): 650 mg via ORAL
  Filled 2024-06-10 (×6): qty 2

## 2024-06-10 MED ORDER — HYDROMORPHONE HCL 1 MG/ML IJ SOLN
0.5000 mg | INTRAMUSCULAR | Status: AC | PRN
Start: 2024-06-10 — End: ?
  Administered 2024-06-10 – 2024-06-12 (×2): 0.5 mg via INTRAVENOUS
  Filled 2024-06-10 (×2): qty 1

## 2024-06-10 MED ORDER — ONDANSETRON HCL 4 MG/2ML IJ SOLN
4.0000 mg | Freq: Once | INTRAMUSCULAR | Status: AC
  Administered 2024-06-10: 4 mg via INTRAVENOUS
  Filled 2024-06-10: qty 2

## 2024-06-10 NOTE — ED Provider Notes (Signed)
 [facility] Provider Note   CSN: [id number] Arrival date & time: 06/10/24  1703     Patient presents with: Dysuria   Anna [name] is a 89 y.o. female.  With a history of CKD, UTIs, asthma and recent COVID infection who presents to the ED for dysuria.  Symptoms of right lower back pain burning with urination began this morning.  Some associated generalized fatigue and weakness along with nausea.  Son notes that she seems a little more confused today.  Similar to presentations and previous UTIs.  No chest pain, shortness of breath, abdominal pain nausea vomiting or diarrhea.  Was in her normal state of health yesterday.  Had COVID last month fully recovered with no significant respiratory    Dysuria      Prior to Admission medications   Medication Sig Start Date End Date Taking? Authorizing Provider  ascorbic acid (VITAMIN C) 500 MG tablet Take 500 mg by mouth daily.    [provider]  azelastine  (ASTELIN ) 0.1 % nasal spray Place 1 spray into both nostrils 2 (two) times daily. Use in each nostril as directed 12/20/23   Thedora Garnette HERO, MD  b complex vitamins capsule Take 1 capsule by mouth daily.    [provider]  cetirizine (ZYRTEC) 10 MG tablet Take 10 mg by mouth every evening.    [provider]  gabapentin  (NEURONTIN ) 300 MG capsule Take 1 capsule (300 mg total) by mouth at bedtime. 05/28/23   Thedora Garnette HERO, MD  latanoprost  (XALATAN ) 0.005 % ophthalmic solution Place 1 drop into both eyes every evening. 02/14/22   [provider]  Lifitegrast  (XIIDRA ) 5 % SOLN Place 1 drop into both eyes 2 (two) times daily.    [provider]  Lutein  10 MG TABS Take 1 tablet by mouth daily.    [provider]  Multiple Vitamins-Minerals (PRESERVISION AREDS 2 PO) Take 1 tablet by mouth in the morning and at bedtime.    [provider]  Omega-3 1000 MG CAPS Take 1 capsule by mouth  daily.    [provider]  triamcinolone (NASACORT ALLERGY 24HR) 55 MCG/ACT AERO nasal inhaler Place 2 sprays into the nose daily.    [provider]    Allergies: Demerol [meperidine] and Sulfa antibiotics    Review of Systems  Genitourinary:  Positive for dysuria.    Updated Vital Signs BP (!) 106/51   Pulse (!) 115   Temp (!) 101.8 F (38.8 C) (Rectal)   Resp (!) 26   SpO2 96%   Physical Exam Vitals and nursing note reviewed.  HENT:     Head: Normocephalic and atraumatic.  Eyes:     Pupils: Pupils are equal, round, and reactive to light.  Cardiovascular:     Rate and Rhythm: Normal rate and regular rhythm.  Pulmonary:     Effort: Pulmonary effort is normal.     Breath sounds: Normal breath sounds.  Abdominal:     Palpations: Abdomen is soft.     Tenderness: There is no abdominal tenderness.  Musculoskeletal:     Right lower leg: No edema.     Left lower leg: No edema.  Skin:    General: Skin is warm and dry.  Neurological:     General: No focal deficit present.     Mental Status: She is alert and oriented to person, place, and time.     Sensory: No sensory deficit.  Motor: No weakness.  Psychiatric:        Mood and Affect: Mood normal.     (all labs ordered are listed, but only abnormal results are displayed) Labs Reviewed  COMPREHENSIVE METABOLIC PANEL WITH GFR - Abnormal; Notable for the following components:      Result Value   Glucose, Bld 133 (*)    Anion gap 16 (*)    All other components within normal limits  LACTIC ACID, PLASMA - Abnormal; Notable for the following components:   Lactic Acid, Venous 4.8 (*)    All other components within normal limits  LACTIC ACID, PLASMA - Abnormal; Notable for the following components:   Lactic Acid, Venous 4.1 (*)    All other components within normal limits  CBC WITH DIFFERENTIAL/PLATELET - Abnormal; Notable for the following components:   WBC 3.5 (*)    Lymphs Abs 0.3 (*)    Monocytes  Absolute 0.0 (*)    All other components within normal limits  URINALYSIS, W/ REFLEX TO CULTURE (INFECTION SUSPECTED) - Abnormal; Notable for the following components:   Hgb urine dipstick TRACE (*)    Leukocytes,Ua TRACE (*)    Bacteria, UA FEW (*)    All other components within normal limits  TROPONIN T, HIGH SENSITIVITY - Abnormal; Notable for the following components:   Troponin T High Sensitivity 29 (*)    All other components within normal limits  TROPONIN T, HIGH SENSITIVITY - Abnormal; Notable for the following components:   Troponin T High Sensitivity 158 (*)    All other components within normal limits  RESP PANEL BY RT-PCR (RSV, FLU A&B, COVID)  RVPGX2  CULTURE, BLOOD (ROUTINE X 2)  CULTURE, BLOOD (ROUTINE X 2)  URINE CULTURE  PROTIME-INR    EKG: EKG Interpretation Date/Time:  Wednesday June 10 2024 17:31:57 EDT Ventricular Rate:  143 PR Interval:  132 QRS Duration:  110 QT Interval:  291 QTC Calculation: 449 R Axis:   34  Text Interpretation: Supraventricular tachycardia Incomplete left bundle branch block Confirmed by Pamella Sharper [phone]) on 06/10/2024 5:51:17 PM  Radiology: CT CHEST ABDOMEN PELVIS W CONTRAST Result Date: 06/10/2024 EXAM: CT CHEST, ABDOMEN AND PELVIS WITH CONTRAST 06/10/2024 08:27:36 PM TECHNIQUE: CT of the chest, abdomen and pelvis was performed with the administration of 100 mL of iohexol  (OMNIPAQUE ) 300 MG/ML solution. Multiplanar reformatted images are provided for review. Automated exposure control, iterative reconstruction, and/or weight based adjustment of the mA/kV was utilized to reduce the radiation dose to as low as reasonably achievable. COMPARISON: CTA chest dated 03/20/2022. CLINICAL HISTORY: Sepsis; +SIRS, PNA vs UTI vs obstructed R stone. 89 y/o female that woke up this morning with right lower back pain, dysuria. Hx UTI. Hx kidney stone. FINDINGS: CHEST: MEDIASTINUM AND LYMPH NODES: Mitral valve calcifications. Thoracic aortic  atherosclerosis. Mild 3 vessel coronary artery disease. The central airways are clear. No mediastinal, hilar or axillary lymphadenopathy. LUNGS AND PLEURA: Subpleural scarring in the lungs bilaterally, favoring postinfectious/inflammatory scarring. Stable nodular scarring in the right lung apex measuring up to 9 mm (image 26), benign given greater than 2 year stability. No follow-up is recommended per Fleischner society guidelines. No focal consolidation or pulmonary edema. No pleural effusion or pneumothorax. ABDOMEN AND PELVIS: LIVER: The liver is unremarkable. GALLBLADDER AND BILE DUCTS: Gallbladder is unremarkable. No biliary ductal dilatation. SPLEEN: No acute abnormality. PANCREAS: No acute abnormality. ADRENAL GLANDS: No acute abnormality. KIDNEYS, URETERS AND BLADDER: 6 mm distal right ureteral calculus just above the UVJ (image 95). Severe  right hydroureteronephrosis. 5 mm nonobstructing right lower pole renal calculus (image 67). Left renal sinus cysts, without hydronephrosis. Per consensus, no follow-up is needed for simple Bosniak type 1 and 2 renal cysts, unless the patient has a malignancy history or risk factors. No perinephric or periureteral stranding. Urinary bladder is unremarkable. GI AND BOWEL: Moderate hiatal hernia. Stomach demonstrates no acute abnormality. There is no bowel obstruction. Sigmoid diverticulosis, without evidence of diverticulitis. REPRODUCTIVE ORGANS: Status post hysterectomy. PERITONEUM AND RETROPERITONEUM: No ascites. No free air. VASCULATURE: Aorta is normal in caliber. ABDOMINAL AND PELVIS LYMPH NODES: No lymphadenopathy. BONES AND SOFT TISSUES: Degenerative changes of the visualized thoracolumbar spine. Lumbar levoscoliosis. No focal soft tissue abnormality. IMPRESSION: 1. 6 mm distal right ureteral calculus just above the UVJ. Associated severe right hydroureteronephrosis. 2. 5 mm nonobstructing right lower pole renal calculus. 3. No acute findings in the chest.  Electronically signed by: Pinkie Pebbles MD 06/10/2024 08:37 PM EDT RP Workstation: [id number]   DG Chest Portable 1 View Result Date: 06/10/2024 EXAM: 1 VIEW(S) XRAY OF THE CHEST 06/10/2024 06:40:49 PM COMPARISON: Chest x-ray 03/20/2022 and CT chest 03/20/2022. CLINICAL HISTORY: PNA. FINDINGS: LUNGS AND PLEURA: Diffuse interstitial opacities throughout the lungs. Bronchial wall thickening. No pulmonary edema. No pleural effusion. No pneumothorax. HEART AND MEDIASTINUM: Aortic atherosclerosis. No acute abnormality of the cardiac and mediastinal silhouettes. BONES AND SOFT TISSUES: No acute osseous abnormality. IMPRESSION: 1. Diffuse interstitial opacities throughout both lungs with bronchial wall thickening likely infectious or inflammatory. Electronically signed by: Norman Gatlin MD 06/10/2024 07:03 PM EDT RP Workstation: [id number]     .Critical Care  Performed by: Pamella Ozell LABOR, DO Authorized by: Pamella Ozell LABOR, DO   Critical care provider statement:    Critical care time (minutes):  80   Critical care time was exclusive of:  Separately billable procedures and treating other patients and teaching time   Critical care was necessary to treat or prevent imminent or life-threatening deterioration of the following conditions:  Sepsis   Critical care was time spent personally by me on the following activities:  Development of treatment plan with patient or surrogate, discussions with consultants, evaluation of patient's response to treatment, examination of patient, ordering and review of laboratory studies, ordering and review of radiographic studies, ordering and performing treatments and interventions, pulse oximetry, re-evaluation of patient's condition, review of old charts and obtaining history from patient or surrogate   I assumed direction of critical care for this patient from another provider in my specialty: no     Care discussed with: admitting provider   Comments:     Discussed  with cardiology, urology and hospitalist    Medications Ordered in the ED  lactated ringers  infusion ( Intravenous New Bag/Given 06/10/24 2147)  piperacillin-tazobactam (ZOSYN) IVPB 3.375 g (has no administration in time range)  acetaminophen  (TYLENOL ) tablet 650 mg (has no administration in time range)  HYDROmorphone  (DILAUDID ) injection 0.5 mg (0.5 mg Intravenous Given 06/10/24 2205)  oxyCODONE  (Oxy IR/ROXICODONE ) immediate release tablet 5 mg (has no administration in time range)  sodium chloride  0.9 % bolus 1,000 mL (0 mLs Intravenous Stopped 06/10/24 1849)  piperacillin-tazobactam (ZOSYN) IVPB 3.375 g (0 g Intravenous Stopped 06/10/24 1845)  acetaminophen  (TYLENOL ) tablet 1,000 mg (1,000 mg Oral Given 06/10/24 1850)  sodium chloride  0.9 % bolus 1,000 mL (0 mLs Intravenous Stopped 06/10/24 2102)  azithromycin  (ZITHROMAX ) 500 mg in sodium chloride  0.9 % 250 mL IVPB (0 mg Intravenous Stopped 06/10/24 2043)  iohexol  (OMNIPAQUE ) 300 MG/ML solution 100 mL (100  mLs Intravenous Contrast Given 06/10/24 2029)  aspirin chewable tablet 324 mg (324 mg Oral Given 06/10/24 2043)  ondansetron  (ZOFRAN ) injection 4 mg (4 mg Intravenous Given 06/10/24 2205)    Clinical Course as of 06/10/24 2213  Wed Jun 10, 2024  1727 Reviewed EKG. appears similar to previous left bundle branch block from EKG of 2023.  Not meeting modified Sgarbossa criteria for MI.  Poor baseline in inferior leads.  Will repeat [MP]  1744 Reviewed repeat EKG.  Again not meeting modified Sgarbossa criteria.  No evidence of inferior MI with better baseline in inferior leads.  No active chest pain shortness of breath [MP]  1831 Initial laboratory workup notable for leukopenia.  High-sensitivity troponin elevated at 29.  Will obtain delta.  Patient remains tachycardic now slightly hypertensive. [MP]  1859 Venous lactic 4.8.  Patient remains hypertensive but still quite tachycardic in the 140s.  Initiated second liter IV fluids [MP]  1907  Chest x-ray concerning for pneumonia.  Patient does have a history of obstructed stones requiring lithotripsy.  Will get a CT chest to evaluate and better visualize for potential pneumonia along with CT abdomen pelvis to look for evidence of obstructive stone or acute intra-abdominal infection.  Will add on azithromycin  to cover for community-acquired pneumonia.  She has already received Zosyn [MP]  2036 Delta troponin of 158.  Will order aspirin and repeat EKG.  Paged cardiology [MP]  2048 Discussed with cardiology on call who agrees there are no acute ischemic changes on any of her EKGs here.  Feels like this is more demand from acute infectious process.  Cardiology team will follow during admission [MP]  2110 CT abdomen pelvis shows obstructed stone at the right UVJ with severe right hydronephrosis.  This likely represents the acute cause of her illness and sepsis.  No evidence of pneumonia on CT chest.  Discussed with Dr. Roseann (urology) who will plan for urologic intervention tonight at [facility] [MP]  2122 Discussed with Dr. Shona (hospitalist) who accepts patient for admission to stepdown [facility] long bed.   Informed the patient and her family of plan for transfer to [facility], urology intervention and admission.  They are in agreement.  Patient has remained hemodynamically stable.  Still tachycardic but now in the 110s down from 140s earlier.  MAP remains above 65. [MP]    Clinical Course User Index [MP] Pamella Ozell LABOR, DO                                 Medical Decision Making 89 year old female with history as above presents to the ED for right sided back pain over her right lower back dysuria and fatigue.  Symptoms started this morning.  No chest pain or shortness of breath.  Initial vital signs notable for hypotension tachycardia tachypnea.  Patient meeting SIRS criteria.  Will obtain laboratory workup including cultures lactic and give IV fluids and antibiotics.  Most likely source  at this time would be urinary.  EKG does redemonstrate left bundle branch block not meeting modified Sgarbossa criteria.  Sinus tachycardia.  No chest pain or shortness of breath but will send for high-sensitivity troponin recognizing this could be atypical presentation for MI in elderly female.  Will continue to monitor on telemetry and monitor hemodynamic status and heart rate closely  Amount and/or Complexity of Data Reviewed Labs: ordered. Radiology: ordered.  Risk OTC drugs. Prescription drug management. Decision regarding hospitalization.  Final diagnoses:  Sepsis with acute organ dysfunction, due to unspecified organism, unspecified organ dysfunction type, unspecified whether septic shock present [facility])  Right nephrolithiasis  Hydronephrosis of right kidney    ED Discharge Orders     None          Pamella Ozell LABOR, DO 06/10/24 2214

## 2024-06-10 NOTE — Consult Note (Signed)
 Urology Consult   Physician requesting consult: Ozell Marine, DO  Reason for consult: right ureteral calculus, sepsis  History of Present Illness: Anna [name] is a 89 y.o. female seen in consultation from Dr. Ozell Marine for evaluation of a right ureteral calculus and sepsis.  She presented to ED today with right back pain and dysuria.  She also had generalized fatigue and nausea.  She was hypotensive and tachycardic in ED.  BP improved with fluids. WBC 3.5K Cr 0.88 Lactic acid 4.8 U/A: 6-10 WBC, 0-5 RBC, few bacteria CT chest, abdomen and pelvis: 6 mm calculus in distal right ureter with severe right hydroureteronephrosis.  She received IV Zosyn.  She has a history of nephrolithiasis and UTI's.  Prior management with [facility] and [facility].    Past Medical History:  Diagnosis Date   Allergy    seasonal also with dogs and cats   Arthritis    Asthma    hx of in childhood    Bladder infection    hx of    Cancer (HCC)    breast cancer bilat has had double mastectomy    Chronic kidney disease    hx of kidney stones last one pasted 1 month ago    COVID    Fibrocystic breast disease    H/O blood clots    during delivery occurred 50 years ago per left arm    PONV (postoperative nausea and vomiting)    pt states stays very sleepy for days following anesthesia     Past Surgical History:  Procedure Laterality Date   ABDOMINAL HYSTERECTOMY     APPENDECTOMY     BACK SURGERY     disc repaired 20 years ago    BREAST SURGERY     double mastectomy    CESAREAN SECTION     times 2   colonscopy      cyst repaired      following mammogram / cyst ruptured had to surgically repair area / left side    DILATION AND CURETTAGE OF UTERUS     EYE SURGERY     bilat cataract surgery    HAND SURGERY     left hand / joint issues with thumb    LUNG SURGERY     related to fatty tumor / left approx 12 years ago    right knee arthroscopy      TOTAL KNEE ARTHROPLASTY Right 01/05/2015   Procedure:  RIGHT TOTAL KNEE ARTHROPLASTY;  Surgeon: Tanda Heading, MD;  Location: [facility];  Service: Orthopedics;  Laterality: Right;    Medications:  Scheduled Meds: Continuous Infusions: PRN Meds:.  Allergies:  Allergies  Allergen Reactions   Demerol [Meperidine] Nausea And Vomiting   Sulfa Antibiotics Nausea And Vomiting    History reviewed. No pertinent family history.  Social History:  reports that she has never smoked. She has never used smokeless tobacco. She reports that she does not drink alcohol and does not use drugs.  ROS: A complete review of systems was performed.  All systems are negative except for pertinent findings as noted.  Physical Exam:  Vital signs in last 24 hours: Temp:  [99.3 F (37.4 C)-101.8 F (38.8 C)] 101.8 F (38.8 C) (10/15 1902) Pulse Rate:  [119-142] 119 (10/15 2030) Resp:  [20-36] 26 (10/15 2030) BP: (106-152)/(53-74) 109/53 (10/15 2030) SpO2:  [94 %-97 %] 97 % (10/15 2030) GENERAL APPEARANCE:  Well appearing, well developed, well nourished, NAD HEENT:  Atraumatic, normocephalic, oropharynx clear NECK:  Supple without  lymphadenopathy or thyromegaly ABDOMEN:  Soft, non-tender, no masses EXTREMITIES:  Moves all extremities well, without clubbing, cyanosis, or edema NEUROLOGIC:  Alert and oriented x 3, normal gait, CN II-XII grossly intact MENTAL STATUS:  appropriate BACK:  Non-tender to palpation, No CVAT SKIN:  Warm, dry, and intact  Laboratory Data:  Recent Labs    06/10/24 1743  WBC 3.5*  HGB 13.9  HCT 43.1  PLT 180    Recent Labs    06/10/24 1743  NA 142  K 3.8  CL 105  GLUCOSE 133*  BUN 16  CALCIUM 9.7  CREATININE 0.88     Results for orders placed or performed during the hospital encounter of 06/10/24 (from the past 24 hours)  Comprehensive metabolic panel     Status: Abnormal   Collection Time: 06/10/24  5:43 PM  Result Value Ref Range   Sodium 142 135 - 145 mmol/L   Potassium 3.8 3.5 - 5.1 mmol/L   Chloride 105 98  - 111 mmol/L   CO2 22 22 - 32 mmol/L   Glucose, Bld 133 (H) 70 - 99 mg/dL   BUN 16 8 - 23 mg/dL   Creatinine, Ser 9.11 0.44 - 1.00 mg/dL   Calcium 9.7 8.9 - 89.6 mg/dL   Total Protein 6.5 6.5 - 8.1 g/dL   Albumin 3.9 3.5 - 5.0 g/dL   AST 31 15 - 41 U/L   ALT 18 0 - 44 U/L   Alkaline Phosphatase 110 38 - 126 U/L   Total Bilirubin 0.8 0.0 - 1.2 mg/dL   GFR, Estimated >39 >39 mL/min   Anion gap 16 (H) 5 - 15  CBC with Differential     Status: Abnormal   Collection Time: 06/10/24  5:43 PM  Result Value Ref Range   WBC 3.5 (L) 4.0 - 10.5 K/uL   RBC 4.69 3.87 - 5.11 MIL/uL   Hemoglobin 13.9 12.0 - 15.0 g/dL   HCT 56.8 63.9 - 53.9 %   MCV 91.9 80.0 - 100.0 fL   MCH 29.6 26.0 - 34.0 pg   MCHC 32.3 30.0 - 36.0 g/dL   RDW 86.1 88.4 - 84.4 %   Platelets 180 150 - 400 K/uL   nRBC 0.0 0.0 - 0.2 %   Neutrophils Relative % 92 %   Neutro Abs 3.2 1.7 - 7.7 K/uL   Lymphocytes Relative 8 %   Lymphs Abs 0.3 (L) 0.7 - 4.0 K/uL   Monocytes Relative 0 %   Monocytes Absolute 0.0 (L) 0.1 - 1.0 K/uL   Eosinophils Relative 0 %   Eosinophils Absolute 0.0 0.0 - 0.5 K/uL   Basophils Relative 0 %   Basophils Absolute 0.0 0.0 - 0.1 K/uL   Immature Granulocytes 0 %   Abs Immature Granulocytes 0.00 0.00 - 0.07 K/uL  Protime-INR     Status: None   Collection Time: 06/10/24  5:43 PM  Result Value Ref Range   Prothrombin Time 14.1 11.4 - 15.2 seconds   INR 1.0 0.8 - 1.2  Troponin T, High Sensitivity     Status: Abnormal   Collection Time: 06/10/24  5:43 PM  Result Value Ref Range   Troponin T High Sensitivity 29 (H) 0 - 19 ng/L  Lactic acid, plasma     Status: Abnormal   Collection Time: 06/10/24  5:44 PM  Result Value Ref Range   Lactic Acid, Venous 4.8 (HH) 0.5 - 1.9 mmol/L  Urinalysis, w/ Reflex to Culture (Infection Suspected) -Urine, Clean Catch  Status: Abnormal   Collection Time: 06/10/24  6:21 PM  Result Value Ref Range   Specimen Source URINE, CATHETERIZED    Color, Urine YELLOW YELLOW    APPearance CLEAR CLEAR   Specific Gravity, Urine 1.020 1.005 - 1.030   pH 6.5 5.0 - 8.0   Glucose, UA NEGATIVE NEGATIVE mg/dL   Hgb urine dipstick TRACE (A) NEGATIVE   Bilirubin Urine NEGATIVE NEGATIVE   Ketones, ur NEGATIVE NEGATIVE mg/dL   Protein, ur NEGATIVE NEGATIVE mg/dL   Nitrite NEGATIVE NEGATIVE   Leukocytes,Ua TRACE (A) NEGATIVE   Squamous Epithelial / HPF 0-5 0 - 5 /HPF   WBC, UA 6-10 0 - 5 WBC/hpf   RBC / HPF 0-5 0 - 5 RBC/hpf   Bacteria, UA FEW (A) NONE SEEN  Resp panel by RT-PCR (RSV, Flu A&B, Covid) Anterior Nasal Swab     Status: None   Collection Time: 06/10/24  6:21 PM   Specimen: Anterior Nasal Swab  Result Value Ref Range   SARS Coronavirus 2 by RT PCR NEGATIVE NEGATIVE   Influenza A by PCR NEGATIVE NEGATIVE   Influenza B by PCR NEGATIVE NEGATIVE   Resp Syncytial Virus by PCR NEGATIVE NEGATIVE  Lactic acid, plasma     Status: Abnormal   Collection Time: 06/10/24  7:32 PM  Result Value Ref Range   Lactic Acid, Venous 4.1 (HH) 0.5 - 1.9 mmol/L  Troponin T, High Sensitivity     Status: Abnormal   Collection Time: 06/10/24  7:32 PM  Result Value Ref Range   Troponin T High Sensitivity 158 (HH) 0 - 19 ng/L   Recent Results (from the past 240 hours)  Resp panel by RT-PCR (RSV, Flu A&B, Covid) Anterior Nasal Swab     Status: None   Collection Time: 06/10/24  6:21 PM   Specimen: Anterior Nasal Swab  Result Value Ref Range Status   SARS Coronavirus 2 by RT PCR NEGATIVE NEGATIVE Final    Comment: (NOTE) SARS-CoV-2 target nucleic acids are NOT DETECTED.  The SARS-CoV-2 RNA is generally detectable in upper respiratory specimens during the acute phase of infection. The lowest concentration of SARS-CoV-2 viral copies this assay can detect is 138 copies/mL. A negative result does not preclude SARS-Cov-2 infection and should not be used as the sole basis for treatment or other patient management decisions. A negative result may occur with  improper specimen  collection/handling, submission of specimen other than nasopharyngeal swab, presence of viral mutation(s) within the areas targeted by this assay, and inadequate number of viral copies(<138 copies/mL). A negative result must be combined with clinical observations, patient history, and epidemiological information. The expected result is Negative.  Fact Sheet for Patients:  BloggerCourse.com  Fact Sheet for Healthcare Providers:  SeriousBroker.it  This test is no t yet approved or cleared by the [place]  FDA and  has been authorized for detection and/or diagnosis of SARS-CoV-2 by FDA under an Emergency Use Authorization (EUA). This EUA will remain  in effect (meaning this test can be used) for the duration of the COVID-19 declaration under Section 564(b)(1) of the Act, 21 U.S.C.section 360bbb-3(b)(1), unless the authorization is terminated  or revoked sooner.       Influenza A by PCR NEGATIVE NEGATIVE Final   Influenza B by PCR NEGATIVE NEGATIVE Final    Comment: (NOTE) The Xpert Xpress SARS-CoV-2/FLU/RSV plus assay is intended as an aid in the diagnosis of influenza from Nasopharyngeal swab specimens and should not be used as a sole basis for  treatment. Nasal washings and aspirates are unacceptable for Xpert Xpress SARS-CoV-2/FLU/RSV testing.  Fact Sheet for Patients: BloggerCourse.com  Fact Sheet for Healthcare Providers: SeriousBroker.it  This test is not yet approved or cleared by the [place]  FDA and has been authorized for detection and/or diagnosis of SARS-CoV-2 by FDA under an Emergency Use Authorization (EUA). This EUA will remain in effect (meaning this test can be used) for the duration of the COVID-19 declaration under Section 564(b)(1) of the Act, 21 U.S.C. section 360bbb-3(b)(1), unless the authorization is terminated or revoked.     Resp Syncytial  Virus by PCR NEGATIVE NEGATIVE Final    Comment: (NOTE) Fact Sheet for Patients: BloggerCourse.com  Fact Sheet for Healthcare Providers: SeriousBroker.it  This test is not yet approved or cleared by the [place]  FDA and has been authorized for detection and/or diagnosis of SARS-CoV-2 by FDA under an Emergency Use Authorization (EUA). This EUA will remain in effect (meaning this test can be used) for the duration of the COVID-19 declaration under Section 564(b)(1) of the Act, 21 U.S.C. section 360bbb-3(b)(1), unless the authorization is terminated or revoked.  Performed at [facility], [address], [place], [place] [address]     Renal Function: Recent Labs    06/10/24 1743  CREATININE 0.88   CrCl cannot be calculated (Unknown ideal weight.).  Radiologic Imaging: CT CHEST ABDOMEN PELVIS W CONTRAST Result Date: 06/10/2024 EXAM: CT CHEST, ABDOMEN AND PELVIS WITH CONTRAST 06/10/2024 08:27:36 PM TECHNIQUE: CT of the chest, abdomen and pelvis was performed with the administration of 100 mL of iohexol  (OMNIPAQUE ) 300 MG/ML solution. Multiplanar reformatted images are provided for review. Automated exposure control, iterative reconstruction, and/or weight based adjustment of the mA/kV was utilized to reduce the radiation dose to as low as reasonably achievable. COMPARISON: CTA chest dated 03/20/2022. CLINICAL HISTORY: Sepsis; +SIRS, PNA vs UTI vs obstructed R stone. 89 y/o female that woke up this morning with right lower back pain, dysuria. Hx UTI. Hx kidney stone. FINDINGS: CHEST: MEDIASTINUM AND LYMPH NODES: Mitral valve calcifications. Thoracic aortic atherosclerosis. Mild 3 vessel coronary artery disease. The central airways are clear. No mediastinal, hilar or axillary lymphadenopathy. LUNGS AND PLEURA: Subpleural scarring in the lungs bilaterally, favoring postinfectious/inflammatory scarring. Stable nodular scarring in  the right lung apex measuring up to 9 mm (image 26), benign given greater than 2 year stability. No follow-up is recommended per Fleischner society guidelines. No focal consolidation or pulmonary edema. No pleural effusion or pneumothorax. ABDOMEN AND PELVIS: LIVER: The liver is unremarkable. GALLBLADDER AND BILE DUCTS: Gallbladder is unremarkable. No biliary ductal dilatation. SPLEEN: No acute abnormality. PANCREAS: No acute abnormality. ADRENAL GLANDS: No acute abnormality. KIDNEYS, URETERS AND BLADDER: 6 mm distal right ureteral calculus just above the UVJ (image 95). Severe right hydroureteronephrosis. 5 mm nonobstructing right lower pole renal calculus (image 67). Left renal sinus cysts, without hydronephrosis. Per consensus, no follow-up is needed for simple Bosniak type 1 and 2 renal cysts, unless the patient has a malignancy history or risk factors. No perinephric or periureteral stranding. Urinary bladder is unremarkable. GI AND BOWEL: Moderate hiatal hernia. Stomach demonstrates no acute abnormality. There is no bowel obstruction. Sigmoid diverticulosis, without evidence of diverticulitis. REPRODUCTIVE ORGANS: Status post hysterectomy. PERITONEUM AND RETROPERITONEUM: No ascites. No free air. VASCULATURE: Aorta is normal in caliber. ABDOMINAL AND PELVIS LYMPH NODES: No lymphadenopathy. BONES AND SOFT TISSUES: Degenerative changes of the visualized thoracolumbar spine. Lumbar levoscoliosis. No focal soft tissue abnormality. IMPRESSION: 1. 6 mm distal right ureteral  calculus just above the UVJ. Associated severe right hydroureteronephrosis. 2. 5 mm nonobstructing right lower pole renal calculus. 3. No acute findings in the chest. Electronically signed by: Pinkie Pebbles MD 06/10/2024 08:37 PM EDT RP Workstation: [id number]   DG Chest Portable 1 View Result Date: 06/10/2024 EXAM: 1 VIEW(S) XRAY OF THE CHEST 06/10/2024 06:40:49 PM COMPARISON: Chest x-ray 03/20/2022 and CT chest 03/20/2022. CLINICAL  HISTORY: PNA. FINDINGS: LUNGS AND PLEURA: Diffuse interstitial opacities throughout the lungs. Bronchial wall thickening. No pulmonary edema. No pleural effusion. No pneumothorax. HEART AND MEDIASTINUM: Aortic atherosclerosis. No acute abnormality of the cardiac and mediastinal silhouettes. BONES AND SOFT TISSUES: No acute osseous abnormality. IMPRESSION: 1. Diffuse interstitial opacities throughout both lungs with bronchial wall thickening likely infectious or inflammatory. Electronically signed by: Norman Gatlin MD 06/10/2024 07:03 PM EDT RP Workstation: [id number]    I independently reviewed the above imaging studies.  Impression/Recommendation Right ureteral calculus with obstruction Sepsis  I reviewed the CT findings with the patient. Given the obstructing ureteral calculus and sepsis, recommend urgent surgical management with cystoscopy and stent insertion for decompression of obstructed right kidney.  Procedure and risk discussed in detail.  Will proceed with cystoscopy, right retrograde pyelogram, insertion of right ureteral stent.  Adine Manly 06/10/2024, 9:11 PM

## 2024-06-10 NOTE — ED Triage Notes (Addendum)
 Woke up this morning with right lower back pain , dysuria . Hx UTI ,  Alert and oriented x 4 . Hx kidney stone,  Lethargy

## 2024-06-11 ENCOUNTER — Inpatient Hospital Stay (HOSPITAL_COMMUNITY)

## 2024-06-11 ENCOUNTER — Inpatient Hospital Stay (HOSPITAL_COMMUNITY): Admitting: Anesthesiology

## 2024-06-11 ENCOUNTER — Encounter (HOSPITAL_COMMUNITY): Admission: EM | Disposition: E | Payer: Self-pay | Source: Home / Self Care | Attending: Internal Medicine

## 2024-06-11 DIAGNOSIS — R7989 Other specified abnormal findings of blood chemistry: Secondary | ICD-10-CM

## 2024-06-11 DIAGNOSIS — N200 Calculus of kidney: Secondary | ICD-10-CM

## 2024-06-11 DIAGNOSIS — I509 Heart failure, unspecified: Secondary | ICD-10-CM

## 2024-06-11 DIAGNOSIS — A498 Other bacterial infections of unspecified site: Secondary | ICD-10-CM | POA: Diagnosis not present

## 2024-06-11 DIAGNOSIS — J9601 Acute respiratory failure with hypoxia: Secondary | ICD-10-CM

## 2024-06-11 DIAGNOSIS — R6521 Severe sepsis with septic shock: Secondary | ICD-10-CM | POA: Diagnosis present

## 2024-06-11 DIAGNOSIS — N201 Calculus of ureter: Secondary | ICD-10-CM | POA: Diagnosis present

## 2024-06-11 DIAGNOSIS — N138 Other obstructive and reflux uropathy: Secondary | ICD-10-CM

## 2024-06-11 DIAGNOSIS — A419 Sepsis, unspecified organism: Secondary | ICD-10-CM | POA: Diagnosis not present

## 2024-06-11 DIAGNOSIS — R6520 Severe sepsis without septic shock: Secondary | ICD-10-CM

## 2024-06-11 DIAGNOSIS — M150 Primary generalized (osteo)arthritis: Secondary | ICD-10-CM

## 2024-06-11 DIAGNOSIS — N3001 Acute cystitis with hematuria: Secondary | ICD-10-CM

## 2024-06-11 HISTORY — PX: CYSTOSCOPY WITH RETROGRADE PYELOGRAM, URETEROSCOPY AND STENT PLACEMENT: SHX5789

## 2024-06-11 LAB — ECHOCARDIOGRAM COMPLETE
AR max vel: 2.5 cm2
AV Area VTI: 3.01 cm2
AV Area mean vel: 2.69 cm2
AV Mean grad: 3 mmHg
AV Peak grad: 6.6 mmHg
Ao pk vel: 1.28 m/s
Area-P 1/2: 5.84 cm2
Calc EF: 61.9 %
Height: 59 in
MV M vel: 4.4 m/s
MV Peak grad: 77.4 mmHg
MV VTI: 2.21 cm2
P 1/2 time: 534 ms
S' Lateral: 3.1 cm
Single Plane A2C EF: 63 %
Single Plane A4C EF: 60.7 %
Weight: 2059.98 [oz_av]

## 2024-06-11 LAB — PROTIME-INR
INR: 1.6 — ABNORMAL HIGH (ref 0.8–1.2)
Prothrombin Time: 19.9 s — ABNORMAL HIGH (ref 11.4–15.2)

## 2024-06-11 LAB — BLOOD CULTURE ID PANEL (REFLEXED) - BCID2

## 2024-06-11 LAB — URINE CULTURE: Culture: NO GROWTH

## 2024-06-11 LAB — CBC
HCT: 35.6 % — ABNORMAL LOW (ref 36.0–46.0)
Hemoglobin: 10.6 g/dL — ABNORMAL LOW (ref 12.0–15.0)
MCH: 29 pg (ref 26.0–34.0)
MCHC: 29.8 g/dL — ABNORMAL LOW (ref 30.0–36.0)
MCV: 97.5 fL (ref 80.0–100.0)
Platelets: 72 K/uL — ABNORMAL LOW (ref 150–400)
RBC: 3.65 MIL/uL — ABNORMAL LOW (ref 3.87–5.11)
RDW: 14.3 % (ref 11.5–15.5)
WBC: 14.3 K/uL — ABNORMAL HIGH (ref 4.0–10.5)
nRBC: 0 % (ref 0.0–0.2)

## 2024-06-11 LAB — LACTIC ACID, PLASMA
Lactic Acid, Venous: 6 mmol/L (ref 0.5–1.9)
Lactic Acid, Venous: 5.4 mmol/L (ref 0.5–1.9)
Lactic Acid, Venous: 5.1 mmol/L (ref 0.5–1.9)
Lactic Acid, Venous: 5.7 mmol/L (ref 0.5–1.9)
Lactic Acid, Venous: 3.8 mmol/L (ref 0.5–1.9)

## 2024-06-11 LAB — COMPREHENSIVE METABOLIC PANEL WITH GFR
ALT: 26 U/L (ref 0–44)
AST: 61 U/L — ABNORMAL HIGH (ref 15–41)
Albumin: 2.6 g/dL — ABNORMAL LOW (ref 3.5–5.0)
Alkaline Phosphatase: 109 U/L (ref 38–126)
Anion gap: 13 (ref 5–15)
BUN: 15 mg/dL (ref 8–23)
CO2: 18 mmol/L — ABNORMAL LOW (ref 22–32)
Calcium: 8.2 mg/dL — ABNORMAL LOW (ref 8.9–10.3)
Chloride: 111 mmol/L (ref 98–111)
Creatinine, Ser: 1.21 mg/dL — ABNORMAL HIGH (ref 0.44–1.00)
GFR, Estimated: 43 mL/min — ABNORMAL LOW (ref >60)
Glucose, Bld: 117 mg/dL — ABNORMAL HIGH (ref 70–99)
Potassium: 3.6 mmol/L (ref 3.5–5.1)
Sodium: 143 mmol/L (ref 135–145)
Total Bilirubin: 0.6 mg/dL (ref 0.0–1.2)
Total Protein: 4.3 g/dL — ABNORMAL LOW (ref 6.5–8.1)

## 2024-06-11 LAB — MRSA NEXT GEN BY PCR, NASAL: MRSA by PCR Next Gen: NOT DETECTED

## 2024-06-11 LAB — BASIC METABOLIC PANEL WITH GFR
Anion gap: 13 (ref 5–15)
BUN: 20 mg/dL (ref 8–23)
CO2: 18 mmol/L — ABNORMAL LOW (ref 22–32)
Calcium: 8.4 mg/dL — ABNORMAL LOW (ref 8.9–10.3)
Chloride: 110 mmol/L (ref 98–111)
Creatinine, Ser: 0.91 mg/dL (ref 0.44–1.00)
GFR, Estimated: >60 mL/min (ref >60)
Glucose, Bld: 101 mg/dL — ABNORMAL HIGH (ref 70–99)
Potassium: 4.1 mmol/L (ref 3.5–5.1)
Sodium: 141 mmol/L (ref 135–145)

## 2024-06-11 LAB — CORTISOL-AM, BLOOD: Cortisol - AM: 38.1 ug/dL — ABNORMAL HIGH (ref 6.7–22.6)

## 2024-06-11 SURGERY — CYSTOURETEROSCOPY, WITH RETROGRADE PYELOGRAM AND STENT INSERTION
Anesthesia: General | Site: Ureter | Laterality: Right

## 2024-06-11 MED ORDER — AZELASTINE HCL 0.1 % NA SOLN
1.0000 | Freq: Two times a day (BID) | NASAL | Status: DC
  Administered 2024-06-11 – 2024-06-15 (×9): 1 via NASAL
  Filled 2024-06-11: qty 30

## 2024-06-11 MED ORDER — LIDOCAINE 2% (20 MG/ML) 5 ML SYRINGE
INTRAMUSCULAR | Status: DC | PRN
  Administered 2024-06-11: 60 mg via INTRAVENOUS

## 2024-06-11 MED ORDER — LACTATED RINGERS IV SOLN
150.0000 mL/h | INTRAVENOUS | Status: DC
  Administered 2024-06-11: 150 mL/h via INTRAVENOUS

## 2024-06-11 MED ORDER — ONDANSETRON HCL 4 MG/2ML IJ SOLN: 4.0000 mg | Freq: Four times a day (QID) | INTRAMUSCULAR | Status: DC | PRN

## 2024-06-11 MED ORDER — FENTANYL CITRATE (PF) 250 MCG/5ML IJ SOLN
INTRAMUSCULAR | Status: DC | PRN
  Administered 2024-06-11: 25 ug via INTRAVENOUS

## 2024-06-11 MED ORDER — PROPOFOL 10 MG/ML IV BOLUS
INTRAVENOUS | Status: DC | PRN
  Administered 2024-06-11: 80 mg via INTRAVENOUS

## 2024-06-11 MED ORDER — ENOXAPARIN SODIUM 40 MG/0.4ML IJ SOSY
40.0000 mg | PREFILLED_SYRINGE | INTRAMUSCULAR | Status: DC
  Filled 2024-06-11: qty 0.4

## 2024-06-11 MED ORDER — ACETAMINOPHEN 10 MG/ML IV SOLN
INTRAVENOUS | Status: DC | PRN
  Administered 2024-06-11: 1000 mg via INTRAVENOUS

## 2024-06-11 MED ORDER — METHYLPREDNISOLONE SODIUM SUCC 125 MG IJ SOLR
60.0000 mg | Freq: Once | INTRAMUSCULAR | Status: AC
  Administered 2024-06-11: 60 mg via INTRAVENOUS
  Filled 2024-06-11: qty 2

## 2024-06-11 MED ORDER — ORAL CARE MOUTH RINSE: 15.0000 mL | OROMUCOSAL | Status: DC | PRN

## 2024-06-11 MED ORDER — ALBUMIN HUMAN 5 % IV SOLN
25.0000 g | Freq: Once | INTRAVENOUS | Status: AC
  Administered 2024-06-11: 25 g via INTRAVENOUS
  Filled 2024-06-11: qty 500

## 2024-06-11 MED ORDER — EPHEDRINE SULFATE-NACL 50-0.9 MG/10ML-% IV SOSY
PREFILLED_SYRINGE | INTRAVENOUS | Status: DC | PRN
  Administered 2024-06-11 (×3): 5 mg via INTRAVENOUS

## 2024-06-11 MED ORDER — LACTATED RINGERS IV BOLUS
1000.0000 mL | Freq: Once | INTRAVENOUS | Status: AC
  Administered 2024-06-11: 1000 mL via INTRAVENOUS

## 2024-06-11 MED ORDER — LACTATED RINGERS IV BOLUS
500.0000 mL | Freq: Once | INTRAVENOUS | Status: AC
  Administered 2024-06-11: 500 mL via INTRAVENOUS

## 2024-06-11 MED ORDER — LATANOPROST 0.005 % OP SOLN
1.0000 [drp] | Freq: Every evening | OPHTHALMIC | Status: DC
  Administered 2024-06-11 – 2024-06-15 (×5): 1 [drp] via OPHTHALMIC
  Filled 2024-06-11: qty 2.5

## 2024-06-11 MED ORDER — DEXAMETHASONE SOD PHOSPHATE PF 10 MG/ML IJ SOLN
INTRAMUSCULAR | Status: DC | PRN
  Administered 2024-06-11: 10 mg via INTRAVENOUS

## 2024-06-11 MED ORDER — POLYVINYL ALCOHOL 1.4 % OP SOLN
1.0000 [drp] | Freq: Two times a day (BID) | OPHTHALMIC | Status: DC
  Administered 2024-06-11 – 2024-06-15 (×10): 1 [drp] via OPHTHALMIC
  Filled 2024-06-11: qty 15

## 2024-06-11 MED ORDER — OXYCODONE HCL 5 MG PO TABS: 5.0000 mg | ORAL_TABLET | Freq: Once | ORAL | Status: DC | PRN

## 2024-06-11 MED ORDER — OXYCODONE HCL 5 MG/5ML PO SOLN: 5.0000 mg | Freq: Once | ORAL | Status: DC | PRN

## 2024-06-11 MED ORDER — IOHEXOL 300 MG/ML  SOLN
INTRAMUSCULAR | Status: DC | PRN
  Administered 2024-06-11: 12 mL via URETHRAL

## 2024-06-11 MED ORDER — MIDODRINE HCL 5 MG PO TABS
10.0000 mg | ORAL_TABLET | ORAL | Status: AC
  Administered 2024-06-11: 10 mg via ORAL
  Filled 2024-06-11: qty 2

## 2024-06-11 MED ORDER — METHYLPREDNISOLONE SODIUM SUCC 125 MG IJ SOLR: 60.0000 mg | Freq: Every day | INTRAMUSCULAR | Status: DC

## 2024-06-11 MED ORDER — 0.9 % SODIUM CHLORIDE (POUR BTL) OPTIME
TOPICAL | Status: DC | PRN
  Administered 2024-06-11: 1000 mL

## 2024-06-11 MED ORDER — AMISULPRIDE (ANTIEMETIC) 5 MG/2ML IV SOLN: 10.0000 mg | Freq: Once | INTRAVENOUS | Status: DC | PRN

## 2024-06-11 MED ORDER — CHLORHEXIDINE GLUCONATE CLOTH 2 % EX PADS
6.0000 | MEDICATED_PAD | Freq: Every day | CUTANEOUS | Status: DC
  Administered 2024-06-11 – 2024-06-13 (×3): 6 via TOPICAL

## 2024-06-11 MED ORDER — SODIUM CHLORIDE 0.9 % IV SOLN: 2.0000 g | Freq: Once | INTRAVENOUS | Status: DC

## 2024-06-11 MED ORDER — HYDROMORPHONE HCL 1 MG/ML IJ SOLN: 0.2500 mg | INTRAMUSCULAR | Status: DC | PRN

## 2024-06-11 MED ORDER — FENTANYL CITRATE (PF) 100 MCG/2ML IJ SOLN
INTRAMUSCULAR | Status: AC
  Filled 2024-06-11: qty 2

## 2024-06-11 MED ORDER — ACETAMINOPHEN 10 MG/ML IV SOLN
INTRAVENOUS | Status: AC
  Filled 2024-06-11: qty 100

## 2024-06-11 MED ORDER — MIDODRINE HCL 5 MG PO TABS
10.0000 mg | ORAL_TABLET | Freq: Three times a day (TID) | ORAL | Status: AC
Start: 2024-06-11 — End: ?
  Administered 2024-06-11 – 2024-06-12 (×3): 10 mg via ORAL
  Filled 2024-06-11 (×4): qty 2

## 2024-06-11 MED ORDER — LORATADINE 10 MG PO TABS: 10.0000 mg | ORAL_TABLET | Freq: Every day | ORAL | Status: DC | PRN

## 2024-06-11 MED ORDER — SODIUM CHLORIDE 0.9 % IV SOLN: INTRAVENOUS | Status: DC

## 2024-06-11 MED ORDER — ONDANSETRON HCL 4 MG/2ML IJ SOLN
INTRAMUSCULAR | Status: DC | PRN
  Administered 2024-06-11: 4 mg via INTRAVENOUS

## 2024-06-11 MED ORDER — PHENYLEPHRINE HCL (PRESSORS) 10 MG/ML IV SOLN
INTRAVENOUS | Status: DC | PRN
  Administered 2024-06-11: 160 ug via INTRAVENOUS
  Administered 2024-06-11: 240 ug via INTRAVENOUS
  Administered 2024-06-11: 160 ug via INTRAVENOUS
  Administered 2024-06-11: 240 ug via INTRAVENOUS
  Administered 2024-06-11: 160 ug via INTRAVENOUS

## 2024-06-11 MED ORDER — SODIUM CHLORIDE 0.9 % IR SOLN
Status: DC | PRN
  Administered 2024-06-11: 3000 mL

## 2024-06-11 MED ORDER — ONDANSETRON HCL 4 MG PO TABS: 4.0000 mg | ORAL_TABLET | Freq: Four times a day (QID) | ORAL | Status: DC | PRN

## 2024-06-11 MED ORDER — ENOXAPARIN SODIUM 30 MG/0.3ML IJ SOSY
30.0000 mg | PREFILLED_SYRINGE | INTRAMUSCULAR | Status: DC
  Administered 2024-06-11 – 2024-06-13 (×3): 30 mg via SUBCUTANEOUS
  Filled 2024-06-11 (×3): qty 0.3

## 2024-06-11 MED ORDER — PROPOFOL 10 MG/ML IV BOLUS
INTRAVENOUS | Status: AC
  Filled 2024-06-11: qty 20

## 2024-06-11 MED ORDER — CEFTRIAXONE SODIUM 2 G IJ SOLR
2.0000 g | INTRAMUSCULAR | Status: DC
  Administered 2024-06-11 – 2024-06-13 (×3): 2 g via INTRAVENOUS
  Filled 2024-06-11 (×3): qty 20

## 2024-06-11 SURGICAL SUPPLY — 22 items
BAG URO CATCHER STRL LF (MISCELLANEOUS) ×1 IMPLANT
BASKET ZERO TIP NITINOL 2.4FR (BASKET) IMPLANT
CATH URETL OPEN 5X70 (CATHETERS) IMPLANT
CLOTH BEACON ORANGE TIMEOUT ST (SAFETY) ×1 IMPLANT
GLOVE BIO SURGEON STRL SZ8 (GLOVE) IMPLANT
GLOVE BIOGEL PI IND STRL 7.5 (GLOVE) IMPLANT
GLOVE BIOGEL PI IND STRL 8 (GLOVE) IMPLANT
GLOVE SURG LX STRL 8.0 MICRO (GLOVE) ×1 IMPLANT
GOWN STRL REUS W/ TWL XL LVL3 (GOWN DISPOSABLE) ×1 IMPLANT
GUIDEWIRE STR DUAL SENSOR (WIRE) ×1 IMPLANT
GUIDEWIRE ZIPWRE .038 STRAIGHT (WIRE) IMPLANT
KIT TURNOVER KIT A (KITS) ×1 IMPLANT
MANIFOLD NEPTUNE II (INSTRUMENTS) ×1 IMPLANT
PACK CYSTO (CUSTOM PROCEDURE TRAY) ×1 IMPLANT
SHEATH NAVIGATOR HD 11/13X36 (SHEATH) IMPLANT
SHEATH NAVIGATOR HD 12/14X36 (SHEATH) IMPLANT
SHEATH URETERAL FLEX 12X35 (SHEATH) IMPLANT
STENT URET 6FRX24 CONTOUR (STENTS) IMPLANT
TRACTIP FLEXIVA PULS ID 200XHI (Laser) IMPLANT
TRAY FOLEY MTR SLVR 16FR STAT (SET/KITS/TRAYS/PACK) IMPLANT
TUBING CONNECTING 10 (TUBING) ×1 IMPLANT
TUBING UROLOGY SET (TUBING) ×1 IMPLANT

## 2024-06-11 NOTE — Progress Notes (Addendum)
     Patient Name: Anna [name]           DOB: 1934/06/14  MRN: [id number]      Admission Date: 06/10/2024  Attending Provider: Christobal Guadalajara, MD  Primary Diagnosis: Sepsis with acute organ dysfunction [facility])   Level of care: Stepdown   OVERNIGHT EVENT  HPI/ Events of Note  Anna [name], 89 y.o. female, was admitted on 06/10/2024 for Sepsis with acute organ dysfunction [facility]) r/t UTI. In the ED, patient was found to be hypotensive, tachycardic, febrile, tachypneic.  WBC 3.5.  Lactic acid 4.8.   Sepsis criteria was met --she was given 2 L  fluid bolus, and started on broad-spectrum antibiotics--- Azithromycin , zosyn,   CT chest abdomen pelvis revealed right obstructive kidney stone with severe hydronephrosis.   Urology completed cystoscopy with stenting placement tonight.   Hypotensive after procedure, SBP 80-90's, patient mildly lethargic but still able to follow commands.     Bedside Assessment:  Patient is drowsy, A/O x3, with no associated distress.  Denies dizziness, lightheadedness, CP, palpitations,  SOB, N/V, abdominal discomfort.    Respiratory: Bilaterally clear, no wheezing, no crackles. Normal effort. No accessory muscle use.  Cardiovascular: S1S2. Regular rate and rhythm. No carotid bruits.  Abdomen: Abdomen is soft and nontender.  Positive bowel sounds in all quadrants.    Plan: 1 L fluid bolus-  Continue with IV fluids Lactic acid CMP CBC   Addendum: Lactic acid 6.0. -Will recheck MAP mid-50's after 1 L bolus.  Adding on 500 cc bolus, albumin, 10 mg midodrine x2.  RN to notify if SBP <90 or MAP <60.    Zetha Kuhar, DNP, ACNPC- AG [facility] [place]

## 2024-06-11 NOTE — Progress Notes (Signed)
 PROGRESS NOTE WHITTLEY CARANDANG  FMW:[id number] DOB: May 10, 1934 DOA: 06/10/2024 PCP: Thedora Garnette HERO, MD  Brief Narrative/Hospital Course: Anna [name] is a 89 y.o. female with PMH of obstructive kidney stones, asthma, history of breast cancer, seasonal allergies, chronic kidney disease stage III, osteoarthritis, presents with right back pain, dysuria and generalized fatigue nausea without gross hematuria. In the ED found to have severe sepsis with hypotension, tachycardia lactic acidosis. Patient was resuscitated with IV fluids 2 L, antibiotics. Labs- troponin 29 >158,  la 4.8>4>6, UA leukocyte esterase WBC 6-10 CT chest and pelvis with contrast >>6 mm distal right ureteral calculus,severe right hydroureteronephrosis. 2. 5 mm nonobstructing right lower pole renal calculus.3. No acute findings in the chest EDP discussed with cardiology reviewed EKG felt demand ischemia Urology consulted and transferred to [facility] urgently for ureteral stent placement. S/p cystoscopy right ureteral stent Postprocedure hypotensive lethargic worsening lactic acidosis-received element 25 g, midodrine x 2 and 1 l bolus IVF  Subjective: Seen and examined today Son and daughter at the bedside they feel patient is significantly better blood pressure running in 110, heart rate in 90s.  Some abdominal soreness otherwise feels much improved Overnight on NC2 l, tmax 101.8 yesterday 7 pm afebrile since, BP had been soft in the 80s-90s, heart rate this morning 90s respiration 15, Labs AKI creat 0.8>1.2 ,bicarb 18 AST 61 LA 4.1>6, leukocytosis 14 , thrombocytopenia 180>72.  Assessment and plan:  Severe sepsis due to obstructive ureteral calculus 6 mm right ureteral calculus with severe right hydronephrosis Lactic acidosis/anion gap metabolic acidosis Blood cultures 4/4 Klebsiella oxytoca: Patient presented with severe sepsis due to ureteral calculus with severe right hydronephrosis underwent cystoscopy with right  ureteral stent placement.  Status post aggressive fluid hydration and antibiotics. Blood culture reported today- Blood cultures 4/4 Klebsiella oxytoca -discussed with pharmacy changing Zosyn to ceftriaxone  Continue IVF 150 cc/h, midodrine 10 3 times daily, trend lactic acid- improving. 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   DVT prophylaxis: enoxaparin  (LOVENOX ) injection 30 mg Start: 06/11/24 1003 Code Status:   Code Status: Limited: Do not attempt resuscitation (DNR) -DNR-LIMITED -Do Not Intubate/DNI  Family Communication: plan of care discussed with patient, son and daughter at bedside. Patient status is: Remains hospitalized because of severity of illness Level of care: Stepdown  as she is critically ill  Dispo: The patient is from: HOME with her son            Anticipated disposition: TBD Objective: Vitals last 24 hrs: Vitals:   06/11/24 0645 06/11/24 0700 06/11/24 0800 06/11/24 0815  BP: (!) 107/41 (!) 95/39 (!) 118/53 (!) 115/42  Pulse: 100 93 (!) 101 96  Resp: (!) 23 15 (!) 27 20  Temp:   97.7 F (36.5 C)   TempSrc:   Axillary   SpO2: 96% 95% 97% 96%  Weight:      Height:        Physical Examination: General exam: alert awake, oriented, pleasant HEENT:Oral mucosa moist, Ear/Nose WNL grossly Respiratory system: Bilaterally clear  BS,no use of accessory muscle Cardiovascular system: S1 & S2 +, No JVD. Gastrointestinal system: Abdomen soft,NT,ND, BS+ Nervous System: Alert, awake, moving all extremities,and following  commands. Extremities: extremities warm, leg edema mild Skin: Warm, no rashes MSK: Normal muscle bulk,tone, power   Medications reviewed:  Scheduled Meds:  artificial tears  1 drop Both Eyes BID   azelastine   1 spray Each Nare BID   Chlorhexidine  Gluconate Cloth  6 each Topical Daily   enoxaparin  (LOVENOX ) injection  30 mg Subcutaneous Q24H   latanoprost   1 drop Both Eyes QPM   midodrine  10 mg Oral TID WC   Continuous Infusions:  cefTRIAXone  (ROCEPHIN )  IV 2 g (06/11/24 1046)   lactated ringers  150 mL/hr (06/11/24 0800)   Diet: Diet Order             Diet regular Fluid consistency: Thin  Diet effective now                    Data Reviewed: I have personally reviewed following labs and imaging studies ( see epic result tab) CBC: Recent Labs  Lab 06/10/24 1743 06/11/24 0314  WBC 3.5* 14.3*  NEUTROABS 3.2  --   HGB 13.9 10.6*  HCT 43.1 35.6*  MCV 91.9 97.5  PLT 180 72*   CMP: Recent Labs  Lab 06/10/24 1743 06/11/24 0314  NA 142 143  K 3.8 3.6  CL 105 111  CO2 22 18*  GLUCOSE 133* 117*  BUN 16 15  CREATININE 0.88 1.21*  CALCIUM 9.7 8.2*   GFR: Estimated Creatinine Clearance: 25.5 mL/min (A) (by C-G formula based on SCr of 1.21 mg/dL (H)). Recent Labs  Lab 06/10/24 1743 06/11/24 0314  AST 31 61*  ALT 18 26  ALKPHOS 110 109  BILITOT 0.8 0.6  PROT 6.5 4.3*  ALBUMIN 3.9 2.6*   No results for input(s): LIPASE, AMYLASE in the last 168 hours. No results for input(s): AMMONIA in the last 168 hours. Coagulation Profile:  Recent Labs  Lab 06/10/24 1743 06/11/24 0314  INR 1.0 1.6*   Unresulted Labs (From admission, onward)     Start     Ordered   06/18/24 0500  Creatinine, serum  (enoxaparin  (LOVENOX )    CrCl >/= 30 ml/min)  Weekly,   R     Comments: while on enoxaparin  therapy    06/11/24 0033   06/12/24 0500  Basic metabolic panel with GFR  Daily,   R     Question:  Specimen collection method  Answer:  Lab=Lab collect   06/11/24 0721    06/12/24 0500  CBC  Daily,   R     Question:  Specimen collection method  Answer:  Lab=Lab collect   06/11/24 0721   06/11/24 0201  Urine Culture  RELEASE UPON ORDERING,   STAT       Comments: Specimen A: Specimen Description: Right Ureteral Pelvic Urine Phone [phone] Immunocompromised?  No  Antibiotic Treatment: Zithromax , Zosyn Is the patient on airborne/droplet precautions? No Clinical History:  N/A Special Instructions:  none Specimen Disposition:  Laboratory     06/11/24 0201   06/10/24 2031  Urine Culture  Add-on,   URGENT       Question:  Indication  Answer:  Sepsis   06/10/24 2030           Antimicrobials/Microbiology: Anti-infectives (From admission, onward)    Start     Dose/Rate Route Frequency Ordered Stop   06/11/24 1000  cefTRIAXone  (ROCEPHIN ) 2  g in sodium chloride  0.9 % 100 mL IVPB        2 g 200 mL/hr over 30 Minutes Intravenous Every 24 hours 06/11/24 0851     06/11/24 0200  piperacillin-tazobactam (ZOSYN) IVPB 3.375 g  Status:  Discontinued        3.375 g 12.5 mL/hr over 240 Minutes Intravenous Every 8 hours 06/10/24 2129 06/11/24 0851   06/11/24 0130  ceFEPIme (MAXIPIME) 2 g in sodium chloride  0.9 % 100 mL IVPB  Status:  Discontinued        2 g 200 mL/hr over 30 Minutes Intravenous  Once 06/11/24 0033 06/11/24 0039   06/10/24 1915  azithromycin  (ZITHROMAX ) 500 mg in sodium chloride  0.9 % 250 mL IVPB        500 mg 250 mL/hr over 60 Minutes Intravenous  Once 06/10/24 1907 06/10/24 2043   06/10/24 1745  piperacillin-tazobactam (ZOSYN) IVPB 3.375 g        3.375 g 100 mL/hr over 30 Minutes Intravenous  Once 06/10/24 1738 06/10/24 1845         Component Value Date/Time   SDES  06/10/2024 1747    BLOOD LEFT ARM Performed at [facility], [address] [place], [place] [address]    SDES  06/10/2024 1747    BLOOD RIGHT ARM Performed at [facility], [address] [place], [place] [address]    [facility]  06/10/2024 1747     BOTTLES DRAWN AEROBIC AND ANAEROBIC Blood Culture adequate volume Performed at [facility], [address] [place], [place] [address]     [facility]  06/10/2024 1747    BOTTLES DRAWN AEROBIC AND ANAEROBIC Blood Culture adequate volume Performed at [facility], [address] [place], [place] [address]    BERNIS SETTLE NEGATIVE RODS 06/10/2024 1747   CULT GRAM NEGATIVE RODS 06/10/2024 1747   REPTSTATUS PENDING 06/10/2024 1747   REPTSTATUS PENDING 06/10/2024 1747    Procedures: Procedure(s) (LRB): CYSTOURETEROSCOPY, WITH RETROGRADE PYELOGRAM AND STENT INSERTION (Right)   Mennie LAMY, MD [facility] 06/11/2024, 11:17 AM

## 2024-06-11 NOTE — Progress Notes (Signed)
 * Day of Surgery * Subjective: First time meeting [name]. She was accompanied by two family members with who case, plan, and daily labs were reviewed.   Objective: Vital signs in last 24 hours: Temp:  [97.7 F (36.5 C)-101.8 F (38.8 C)] 97.7 F (36.5 C) (10/16 0800) Pulse Rate:  [93-142] 96 (10/16 0815) Resp:  [14-92] 20 (10/16 0815) BP: (85-152)/(28-74) 115/42 (10/16 0815) SpO2:  [90 %-97 %] 96 % (10/16 0815) Weight:  [58.4 kg] 58.4 kg (10/15 2337)  Assessment/Plan: #right ureteral stone #lactic acidosis #sepsis  Feeling better this am. Hemodynamically stable. TID midodrine per primary team. Only receiving IV fluids.   Afebrile. Slowly improving lactic acidosis. Worsening leukocytosis, an expected finding with sepsis and following instrumentation. Clinically improved. Continue broad ABX while awaiting UC.  Will follow peripherally while awaiting culture data.   Foley can be removed after patient has been afebrile for 24 hrs.  Definitive stone mgmt on an outpt basis with Dr. Roseann in 2-4 weeks after she has cleared her infection   Intake/Output from previous day: 10/15 0701 - 10/16 0700 In: 4775.5 [I.V.:1046.8; IV Piggyback:3728.8] Out: 550 [Urine:550]  Intake/Output this shift: Total I/O In: 167.7 [I.V.:150.8; IV Piggyback:16.8] Out: -   Physical Exam:  General: Alert and oriented CV: No cyanosis Lungs: equal chest rise Abdomen: Soft, NTND, no rebound or guarding Gu:   Lab Results: Recent Labs    06/10/24 1743 06/11/24 0314  HGB 13.9 10.6*  HCT 43.1 35.6*   BMET Recent Labs    06/10/24 1743 06/11/24 0314  NA 142 143  K 3.8 3.6  CL 105 111  CO2 22 18*  GLUCOSE 133* 117*  BUN 16 15  CREATININE 0.88 1.21*  CALCIUM 9.7 8.2*  HGB 13.9 10.6*  WBC 3.5* 14.3*     Studies/Results: DG C-Arm 1-60 Min-No Report Result Date: 06/11/2024 Fluoroscopy was utilized by the requesting physician.  No radiographic interpretation.   CT CHEST  ABDOMEN PELVIS W CONTRAST Result Date: 06/10/2024 EXAM: CT CHEST, ABDOMEN AND PELVIS WITH CONTRAST 06/10/2024 08:27:36 PM TECHNIQUE: CT of the chest, abdomen and pelvis was performed with the administration of 100 mL of iohexol  (OMNIPAQUE ) 300 MG/ML solution. Multiplanar reformatted images are provided for review. Automated exposure control, iterative reconstruction, and/or weight based adjustment of the mA/kV was utilized to reduce the radiation dose to as low as reasonably achievable. COMPARISON: CTA chest dated 03/20/2022. CLINICAL HISTORY: Sepsis; +SIRS, PNA vs UTI vs obstructed R stone. 89 y/o female that woke up this morning with right lower back pain, dysuria. Hx UTI. Hx kidney stone. FINDINGS: CHEST: MEDIASTINUM AND LYMPH NODES: Mitral valve calcifications. Thoracic aortic atherosclerosis. Mild 3 vessel coronary artery disease. The central airways are clear. No mediastinal, hilar or axillary lymphadenopathy. LUNGS AND PLEURA: Subpleural scarring in the lungs bilaterally, favoring postinfectious/inflammatory scarring. Stable nodular scarring in the right lung apex measuring up to 9 mm (image 26), benign given greater than 2 year stability. No follow-up is recommended per Fleischner society guidelines. No focal consolidation or pulmonary edema. No pleural effusion or pneumothorax. ABDOMEN AND PELVIS: LIVER: The liver is unremarkable. GALLBLADDER AND BILE DUCTS: Gallbladder is unremarkable. No biliary ductal dilatation. SPLEEN: No acute abnormality. PANCREAS: No acute abnormality. ADRENAL GLANDS: No acute abnormality. KIDNEYS, URETERS AND BLADDER: 6 mm distal right ureteral calculus just above the UVJ (image 95). Severe right hydroureteronephrosis. 5 mm nonobstructing right lower pole renal calculus (image 67). Left renal sinus cysts, without hydronephrosis. Per consensus, no follow-up is needed for simple  Bosniak type 1 and 2 renal cysts, unless the patient has a malignancy history or risk factors. No  perinephric or periureteral stranding. Urinary bladder is unremarkable. GI AND BOWEL: Moderate hiatal hernia. Stomach demonstrates no acute abnormality. There is no bowel obstruction. Sigmoid diverticulosis, without evidence of diverticulitis. REPRODUCTIVE ORGANS: Status post hysterectomy. PERITONEUM AND RETROPERITONEUM: No ascites. No free air. VASCULATURE: Aorta is normal in caliber. ABDOMINAL AND PELVIS LYMPH NODES: No lymphadenopathy. BONES AND SOFT TISSUES: Degenerative changes of the visualized thoracolumbar spine. Lumbar levoscoliosis. No focal soft tissue abnormality. IMPRESSION: 1. 6 mm distal right ureteral calculus just above the UVJ. Associated severe right hydroureteronephrosis. 2. 5 mm nonobstructing right lower pole renal calculus. 3. No acute findings in the chest. Electronically signed by: Pinkie Pebbles MD 06/10/2024 08:37 PM EDT RP Workstation: [id number]   DG Chest Portable 1 View Result Date: 06/10/2024 EXAM: 1 VIEW(S) XRAY OF THE CHEST 06/10/2024 06:40:49 PM COMPARISON: Chest x-ray 03/20/2022 and CT chest 03/20/2022. CLINICAL HISTORY: PNA. FINDINGS: LUNGS AND PLEURA: Diffuse interstitial opacities throughout the lungs. Bronchial wall thickening. No pulmonary edema. No pleural effusion. No pneumothorax. HEART AND MEDIASTINUM: Aortic atherosclerosis. No acute abnormality of the cardiac and mediastinal silhouettes. BONES AND SOFT TISSUES: No acute osseous abnormality. IMPRESSION: 1. Diffuse interstitial opacities throughout both lungs with bronchial wall thickening likely infectious or inflammatory. Electronically signed by: Norman Gatlin MD 06/10/2024 07:03 PM EDT RP Workstation: [id number]      LOS: 1 day   Ole Bourdon, NP [facility] Pager: [phone]  06/11/2024, 9:02 AM

## 2024-06-11 NOTE — Progress Notes (Signed)
 Pt placed on HHF [place] 40L, 55% due to WOB and SOB.

## 2024-06-11 NOTE — Care Plan (Signed)
 Patient seen again this evening as having shortness of breath, lactate trending up CXR obtained and reviewed- no obvious fluid overload. She does c/o shortness of breath and is tachypneic in 30s Some swelling around lips tongue. Will get PCCM to see her spoke w/ Dr Gretta. Steroid x 1.Will hold off om ivf, echo  ordered. SLP eval.

## 2024-06-11 NOTE — Progress Notes (Signed)
 PHARMACY - PHYSICIAN COMMUNICATION CRITICAL VALUE ALERT - BLOOD CULTURE IDENTIFICATION (BCID)  Anna [name] is an 89 y.o. female who presented to [facility] on 06/10/2024 with a chief complaint of dysuria and flank pain. Imaging with right obstructing stone and hydronephrosis.  Assessment:   Bcx: 4/4 bottles Klebsiella oxytoca, no resistance detected Ucx (cystoscope): pending Febrile 10/15 pm, now s/p stent placement 10/16 WBC 3.5 > 14.3 (decadrone x 1 in OR)  Name of physician (or Provider) Contacted: Dr. Christobal  Current antibiotics: Zosyn  Changes to prescribed antibiotics recommended: ceftriaxone   Results for orders placed or performed during the hospital encounter of 06/10/24  Blood Culture ID Panel (Reflexed) (Collected: 06/10/2024  5:47 PM)  Result Value Ref Range   Enterococcus faecalis NOT DETECTED NOT DETECTED   Enterococcus Faecium NOT DETECTED NOT DETECTED   Listeria monocytogenes NOT DETECTED NOT DETECTED   Staphylococcus species NOT DETECTED NOT DETECTED   Staphylococcus aureus (BCID) NOT DETECTED NOT DETECTED   Staphylococcus epidermidis NOT DETECTED NOT DETECTED   Staphylococcus lugdunensis NOT DETECTED NOT DETECTED   Streptococcus species NOT DETECTED NOT DETECTED   Streptococcus agalactiae NOT DETECTED NOT DETECTED   Streptococcus pneumoniae NOT DETECTED NOT DETECTED   Streptococcus pyogenes NOT DETECTED NOT DETECTED   A.calcoaceticus-baumannii NOT DETECTED NOT DETECTED   Bacteroides fragilis NOT DETECTED NOT DETECTED   Enterobacterales DETECTED (A) NOT DETECTED   Enterobacter cloacae complex NOT DETECTED NOT DETECTED   Escherichia coli NOT DETECTED NOT DETECTED   Klebsiella aerogenes NOT DETECTED NOT DETECTED   Klebsiella oxytoca DETECTED (A) NOT DETECTED   Klebsiella pneumoniae NOT DETECTED NOT DETECTED   Proteus species NOT DETECTED NOT DETECTED   Salmonella species NOT DETECTED NOT DETECTED   Serratia marcescens NOT DETECTED NOT DETECTED   Haemophilus  influenzae NOT DETECTED NOT DETECTED   Neisseria meningitidis NOT DETECTED NOT DETECTED   Pseudomonas aeruginosa NOT DETECTED NOT DETECTED   Stenotrophomonas maltophilia NOT DETECTED NOT DETECTED   Candida albicans NOT DETECTED NOT DETECTED   Candida auris NOT DETECTED NOT DETECTED   Candida glabrata NOT DETECTED NOT DETECTED   Candida krusei NOT DETECTED NOT DETECTED   Candida parapsilosis NOT DETECTED NOT DETECTED   Candida tropicalis NOT DETECTED NOT DETECTED   Cryptococcus neoformans/gattii NOT DETECTED NOT DETECTED   CTX-M ESBL NOT DETECTED NOT DETECTED   Carbapenem resistance IMP NOT DETECTED NOT DETECTED   Carbapenem resistance KPC NOT DETECTED NOT DETECTED   Carbapenem resistance NDM NOT DETECTED NOT DETECTED   Carbapenem resist OXA 48 LIKE NOT DETECTED NOT DETECTED   Carbapenem resistance VIM NOT DETECTED NOT DETECTED    Stefano MARLA Bologna, PharmD, BCPS Clinical Pharmacist 06/11/2024 8:56 AM

## 2024-06-11 NOTE — Consult Note (Addendum)
 NAME:  Anna [name], MRN:  [id number], DOB:  1933-09-02, LOS: 1 ADMISSION DATE:  06/10/2024, CONSULTATION DATE:  06/11/24 REFERRING MD:  Dr. Christobal, CHIEF COMPLAINT:  rising lactic   History of Present Illness:   40 yoF with PMH as below significant for asthma, CKD III, and obstructive kidney stones who presented 10/15 with right back pain, fever, confusion, fatigue, and nausea since Wednesday am presented found to have severe sepsis due to right obstructive ureteral calculus with severe hydronephrosis with initial lactic acidosis and hypotension which improved with IV fluids, abx, and midodrine.  Underwent cystoscopy with right ureteral stent placement 10/16 am with urology.  BC showing GNR with Klebseilla oxytoca.  Broad spectrum abx narrowed to ceftriaxone .  Cardiology consulted for elevated troponin felt secondary to demand ischemia.  Worsening SOB throughout the day but stable O2 requirements on 2L.  Lactic trending up, 6> 5.4> 5.1>5.7, and concern swelling around lips and tongue.  Solumedrol x 1 given.  CXR showing bibasilar atelectasis and chronic interstitial changes.  BP remains stable and afebrile throughout the day.  Pt currently denies any pain including CP, nausea, or obvious feeling of tongue or lip swelling.  Feels confused.  C/o of worsening SOB with tachypnea this afternoon and feels confused, can't get her words out.  Remains otherwise non focal neurologically.  Has felt more congestion, no cough, since having COVID 4 weeks ago and reports ongoing allergies.  SARS/ flu/ RSV was negative on admit.  PCCM consulted for further evaluation.   Pertinent  Medical History   Past Medical History:  Diagnosis Date   Allergy    seasonal also with dogs and cats   Arthritis    Asthma    hx of in childhood    Bladder infection    hx of    Cancer (HCC)    breast cancer bilat has had double mastectomy    Chronic kidney disease    hx of kidney stones last one pasted 1 month ago    COVID     Fibrocystic breast disease    H/O blood clots    during delivery occurred 50 years ago per left arm    PONV (postoperative nausea and vomiting)    pt states stays very sleepy for days following anesthesia    Significant Hospital Events: Including procedures, antibiotic start and stop dates in addition to other pertinent events   10/16 admitted, s/p ureteral stent  Interim History / Subjective:   Objective    Blood pressure (!) 117/43, pulse 91, temperature 97.6 F (36.4 C), temperature source Axillary, resp. rate (!) 27, height 4' 11 (1.499 m), weight 58.4 kg, SpO2 95%.        Intake/Output Summary (Last 24 hours) at 06/11/2024 1633 Last data filed at 06/11/2024 1623 Gross per 24 hour  Intake 6858.66 ml  Output 725 ml  Net 6133.66 ml   Filed Weights   06/10/24 2337  Weight: 58.4 kg   Examination: General:  pleasant elderly female in bed in NAD HEENT: MM pink/ very dry with speech difficult at times, no obvious angioedema or oral  cyanosis,  no stridor, pupils 4/r Neuro: Awake, oriented person, year.  Struggles to answer some questions.  MAE, non focal CV: rr, SR 90s, no obvious murmur, +2 distal pulses/ warm PULM:  tachypnea without obvious accessory muscle use, clear anteriorly, diminished in bases, no wheeze, 2L [place] at 96% GI: soft, bs+, ND/ NT, foley- cyu Extremities: warm/dry, trace ankle edema  Skin: no  rashes  Labs/ imaging reviewed UOP 432ml/ 12hrs Net +5.8L  Resolved problem list   Assessment and Plan   Severe sepsis secondary to right obstructive ureteral calculus s/p stent placement 10/15 am with GNR/ klebsiella oxytoca bacteremia  Lactic acidosis/ NAGMA Elevated troponin with suspected demand ischemia Thrombocytopenia- likely related to severe sepsis +/- dilutional component  - trop 29> 120, EKG P:  - at this time, ok to remain in SDU.  Can change to ICU if any decompensation.  Confirmed with pt and son, remains DNR/ DNI, ok with vasopressor support if  needed. - MAP stable on midodrine 10mg  TID, goal > 65 - recheck BMET/ trend lactic  - strict UOP/ strict I/Os - echo pending, denies current CP.  Cardiology consulted this am feels consistent with NSTEMI secondary to severe sepsis.  Echo pending, no plans for cardiac intervention at this time.  MIVF held with worsening SOB, 5.8L positive - suspect increasing lactic could be related to dyspnea/ tachypnea  - cont ceftriaxone  - urology following- cont foley for 24hrs  Hypoxic respiratory failure- suspect atelectasis, can't rule out underlying ILD process with mosaic pattern, evidence of gas trapping and subpleural scarring/ atelectasis  - s/p CT chest/ abd/ pelvis w/ contrast 10/15 showing subpleural scaring bilaterally and stable nodular scarring in right lung apex (stable since 2003) P:  - CXR with worsening bibasilar atelectasis - prn HHFNC to prevent fatiguing.  Goal sat > 92% - aggressive pulm hygiene, IS - no current wheezing or evidence of angioedema.  Hold further steroids, consider SABA  - SLP eval pending  - continue zyrtec/ astelin   Remainder per TRH, PCCM will continue to follow   Labs   CBC: Recent Labs  Lab 06/10/24 1743 06/11/24 0314  WBC 3.5* 14.3*  NEUTROABS 3.2  --   HGB 13.9 10.6*  HCT 43.1 35.6*  MCV 91.9 97.5  PLT 180 72*    Basic Metabolic Panel: Recent Labs  Lab 06/10/24 1743 06/11/24 0314  NA 142 143  K 3.8 3.6  CL 105 111  CO2 22 18*  GLUCOSE 133* 117*  BUN 16 15  CREATININE 0.88 1.21*  CALCIUM 9.7 8.2*   GFR: Estimated Creatinine Clearance: 25.5 mL/min (A) (by C-G formula based on SCr of 1.21 mg/dL (H)). Recent Labs  Lab 06/10/24 1743 06/10/24 1744 06/11/24 0314 06/11/24 0625 06/11/24 0754 06/11/24 1259  WBC 3.5*  --  14.3*  --   --   --   LATICACIDVEN  --    < > 6.0* 5.4* 5.1* 5.7*   < > = values in this interval not displayed.    Liver Function Tests: Recent Labs  Lab 06/10/24 1743 06/11/24 0314  AST 31 61*  ALT 18 26   ALKPHOS 110 109  BILITOT 0.8 0.6  PROT 6.5 4.3*  ALBUMIN 3.9 2.6*   No results for input(s): LIPASE, AMYLASE in the last 168 hours. No results for input(s): AMMONIA in the last 168 hours.  ABG No results found for: PHART, PCO2ART, PO2ART, HCO3, TCO2, ACIDBASEDEF, O2SAT   Coagulation Profile: Recent Labs  Lab 06/10/24 1743 06/11/24 0314  INR 1.0 1.6*    Cardiac Enzymes: No results for input(s): CKTOTAL, CKMB, CKMBINDEX, TROPONINI in the last 168 hours.  HbA1C: Hgb A1c MFr Bld  Date/Time Value Ref Range Status  03/21/2022 01:59 AM 5.2 4.8 - 5.6 % Final    Comment:    (NOTE) Pre diabetes:          5.7%-6.4%  Diabetes:              >  6.4%  Glycemic control for   <7.0% adults with diabetes     CBG: No results for input(s): GLUCAP in the last 168 hours.  Review of Systems:   As per HPI otherwise negative  Past Medical History:  She,  has a past medical history of Allergy, Arthritis, Asthma, Bladder infection, Cancer (HCC), Chronic kidney disease, COVID, Fibrocystic breast disease, H/O blood clots, and PONV (postoperative nausea and vomiting).   Surgical History:   Past Surgical History:  Procedure Laterality Date   ABDOMINAL HYSTERECTOMY     APPENDECTOMY     BACK SURGERY     disc repaired 20 years ago    BREAST SURGERY     double mastectomy    CESAREAN SECTION     times 2   colonscopy      cyst repaired      following mammogram / cyst ruptured had to surgically repair area / left side    DILATION AND CURETTAGE OF UTERUS     EYE SURGERY     bilat cataract surgery    HAND SURGERY     left hand / joint issues with thumb    LUNG SURGERY     related to fatty tumor / left approx 12 years ago    right knee arthroscopy      TOTAL KNEE ARTHROPLASTY Right 01/05/2015   Procedure: RIGHT TOTAL KNEE ARTHROPLASTY;  Surgeon: Tanda Heading, MD;  Location: [facility];  Service: Orthopedics;  Laterality: Right;     Social History:   reports  that she has never smoked. She has never used smokeless tobacco. She reports that she does not drink alcohol and does not use drugs.   Family History:  Her family history is not on file.   Allergies Allergies  Allergen Reactions   Demerol [Meperidine] Nausea And Vomiting   Sulfa Antibiotics Nausea And Vomiting     Home Medications  Prior to Admission medications   Medication Sig Start Date End Date Taking? Authorizing Provider  ascorbic acid (VITAMIN C) 500 MG tablet Take 500 mg by mouth daily.   Yes [provider]  b complex vitamins capsule Take 1 capsule by mouth daily.   Yes [provider]  cetirizine (ZYRTEC) 10 MG tablet Take 10 mg by mouth 2 (two) times daily.   Yes [provider]  gabapentin  (NEURONTIN ) 300 MG capsule Take 1 capsule (300 mg total) by mouth at bedtime. 05/28/23  Yes Thedora Garnette HERO, MD  latanoprost  (XALATAN ) 0.005 % ophthalmic solution Place 1 drop into both eyes every evening. 02/14/22  Yes [provider]  Lifitegrast  (XIIDRA ) 5 % SOLN Place 1 drop into both eyes 2 (two) times daily.   Yes [provider]  Lutein  10 MG TABS Take 10 mg by mouth daily.   Yes [provider]  Multiple Vitamins-Minerals (PRESERVISION AREDS 2 PO) Take 1 tablet by mouth in the morning and at bedtime.   Yes [provider]  Omega-3 1000 MG CAPS Take 1 capsule by mouth daily.   Yes [provider]  azelastine  (ASTELIN ) 0.1 % nasal spray Place 1 spray into both nostrils 2 (two) times daily. Use in each nostril as directed Patient not taking: Reported on 06/11/2024 12/20/23   Thedora Garnette HERO, MD  triamcinolone (NASACORT ALLERGY 24HR) 55 MCG/ACT AERO nasal inhaler Place 2 sprays into the nose daily. Patient not taking: Reported on 06/11/2024    [provider]     Critical care time:  CRITICAL CARE Performed by: Lyle Pesa   Total critical care time: 50 minutes  Critical care time was  exclusive of separately billable procedures and treating other patients.  Critical care was necessary to treat or prevent imminent or life-threatening deterioration.  Critical care was time spent personally by me on the following activities: development of treatment plan with patient and/or surrogate as well as nursing, discussions with consultants, evaluation of patient's response to treatment, examination of patient, obtaining history from patient or surrogate, ordering and performing treatments and interventions, ordering and review of laboratory studies, ordering and review of radiographic studies, pulse oximetry and re-evaluation of patient's condition.    Lyle Pesa, NP [facility] 06/11/2024, 6:02 PM  See Amion for pager If no response to pager , please call [phone] until 7pm After 7:00 pm call Elink  [phone]

## 2024-06-11 NOTE — Transfer of Care (Signed)
 Immediate Anesthesia Transfer of Care Note  Patient: Anna [name]  Procedure(s) Performed: ERNESTA, WITH RETROGRADE PYELOGRAM AND STENT INSERTION (Right: Ureter)  Patient Location: PACU  Anesthesia Type:General  Level of Consciousness: drowsy and patient cooperative  Airway & Oxygen Therapy: Patient Spontanous Breathing  Post-op Assessment: Report given to RN and Post -op Vital signs reviewed and stable  Post vital signs: Reviewed and stable  Last Vitals:  Vitals Value Taken Time  BP 116/52 06/11/24 02:07  Temp    Pulse 119 06/11/24 02:10  Resp 17 06/11/24 02:10  SpO2 98 % 06/11/24 02:10  Vitals shown include unfiled device data.  Last Pain:  Vitals:   06/10/24 2337  TempSrc: Oral  PainSc: 4          Complications: No notable events documented.

## 2024-06-11 NOTE — Hospital Course (Addendum)
 Anna [name] is a 89 y.o. female with PMH of obstructive kidney stones, asthma, history of breast cancer, seasonal allergies, chronic kidney disease stage III, osteoarthritis, presents with right back pain, dysuria and generalized fatigue nausea without gross hematuria. In the ED found to have severe sepsis with hypotension, tachycardia lactic acidosis. Patient was resuscitated with IV fluids 2 L, antibiotics. Labs- troponin 29 >158,  la 4.8>4>6, UA leukocyte esterase WBC 6-10 CT chest and pelvis with contrast >>6 mm distal right ureteral calculus,severe right hydroureteronephrosis. 2. 5 mm nonobstructing right lower pole renal calculus.3. No acute findings in the chest EDP discussed with cardiology reviewed EKG felt demand ischemia Urology consulted and transferred to [facility] urgently for ureteral stent placement. S/p cystoscopy right ureteral stent Postprocedure hypotensive lethargic worsening lactic acidosis-received  albumin 25 g, midodrine x 2 and 1 l bolus IVF PCCM was consulted placed on HHFNC.  Patient having acute metabolic encephalopathy in the setting of sepsis Respiratory status has been tenuous, patient has been placed on BiPAP 10/19 palliative care consulted for goals of care Patient was transition to comfort measures for end-of-life care with worsening respiratory status and ongoing altered mental status  Subjective: Seen and examined Multiple family at the bedside patient somewhat tachypneic, on high flow oxygen and initiated on morphine drip resting and appears to be getting more comfortable now  Assessment and plan:  End-of-life care: Morphine drip and other comforting medication has been initiated appreciate palliative care input.  Expecting hospital death  Severe sepsis/septic shock with lactate more than 4 due to obstructive right  ureteral calculus 6 mm w/ severe right hydronephrosis Blood cultures 4/4 Klebsiella oxytoca Klebsiella oxytoca UTI: S/p cystoscopy  with right ureteral stent,aggressive IVF, s/p albumin Lactic acidosis trended down,leukocytosis improving, and BP remains stable-having tenuous respiratory status  Blood culture 4/4 Klebsiella oxytoca, urine culture similar bacteria-sensitive to Rocephin > changed to Zosyn 10/19 due to respiratory status PCCM and urology following.   Acute respiratory failure with hypoxia Pneumonia left base-concerning for aspiration pneumonia: In the setting of severe sepsis> with respiratory failure likely multifactorial, also concern for fluid overload and pneumonia Patient receiving BiPAP along with supplemental oxygen and Lasix Now on EOL care  Concern for fluid overload Acute diastolic CHF : Echo this admission with normal EF, G1 DD, proBNP elevated 10,288, s/p IV Lasix  Elevated troponin from demand ischemia due to sepsis: echo reassuring with no WMA , treat underlying etiology.  Cardio input appreciated  Acute metabolic encephalopathy Deconditioning debility At risk for malnutrition: Patient has been confused in the setting of sepsis hypoxia, remains confused and in distress,    AKI: resolved.    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  Patient and family at this time has decided to transition to comfort measures  DVT prophylaxis:  Code Status:   Code Status: Do  not attempt resuscitation (DNR) - Comfort care Family Communication: plan of care discussed w/ family Patient status is: Remains hospitalized because of severity of illness Level  of care: Stepdown  as she is critically ill  Dispo: The patient is from: HOME with her son            Anticipated disposition: hospice Objective: Vitals last 24 hrs: Vitals:   07-Jul-2024 0250 07/07/24 0300 07-07-2024 0304 07-07-2024 0400  BP:      Pulse: 90 100 97 (!) 104  Resp: (!) 28 (!) 31 (!) 31 (!) 30  Temp:      TempSrc:      SpO2: 94% 95% 91% 91%  Weight:      Height:       Physical Examination: General exam: Not responsive HEENT:Oral mucosa moist, Ear/Nose WNL grossly Respiratory system: Bilaterally diminished  Cardiovascular system: S1 & S2 +, No JVD. Gastrointestinal system: Abdomen soft,NT,ND, BS+ Nervous System: Sleeping Extremities: extremities warm, leg edema mild Skin: Warm, no rashes MSK: Normal muscle bulk,tone, power   Medications reviewed:  Scheduled Meds:  artificial tears  1 drop Both Eyes BID   azelastine   1 spray Each Nare BID   ipratropium-albuterol  3 mL Nebulization Q4H   latanoprost   1 drop Both Eyes QPM   LORazepam  1 mg Intravenous Q6H   Continuous Infusions:  morphine 3 mg/hr (2024/07/07 9047)   Diet: Diet Order             Diet regular Fluid consistency: Thin  Diet effective now

## 2024-06-11 NOTE — Anesthesia Preprocedure Evaluation (Signed)
 Anesthesia Evaluation  Patient identified by MRN, date of birth, ID band Patient awake    Reviewed: Allergy & Precautions, H&P , NPO status , Patient's Chart, lab work & pertinent test results  History of Anesthesia Complications (+) PONV and history of anesthetic complications  Airway Mallampati: II  TM Distance: >3 FB Neck ROM: Full    Dental  (+) Dental Advisory Given   Pulmonary asthma    Pulmonary exam normal breath sounds clear to auscultation       Cardiovascular negative cardio ROS Normal cardiovascular exam Rhythm:Regular Rate:Normal     Neuro/Psych  Headaches  negative psych ROS   GI/Hepatic negative GI ROS, Neg liver ROS,,,  Endo/Other  negative endocrine ROS    Renal/GU Renal disease  negative genitourinary   Musculoskeletal  (+) Arthritis , Osteoarthritis,    Abdominal   Peds negative pediatric ROS (+)  Hematology negative hematology ROS (+)   Anesthesia Other Findings   Reproductive/Obstetrics negative OB ROS                              Anesthesia Physical Anesthesia Plan  ASA: 2 and emergent  Anesthesia Plan: General   Post-op Pain Management: Tylenol  PO (pre-op)*   Induction: Intravenous  PONV Risk Score and Plan: 4 or greater and Treatment may vary due to age or medical condition, Ondansetron , Dexamethasone  and Midazolam  Airway Management Planned: LMA  Additional Equipment:   Intra-op Plan:   Post-operative Plan: Extubation in OR  Informed Consent: I have reviewed the patients History and Physical, chart, labs and discussed the procedure including the risks, benefits and alternatives for the proposed anesthesia with the patient or authorized representative who has indicated his/her understanding and acceptance.     Dental advisory given  Plan Discussed with: CRNA  Anesthesia Plan Comments:         Anesthesia Quick Evaluation

## 2024-06-11 NOTE — Consult Note (Addendum)
 Cardiology  Consult:   Patient ID: Anna [name]; MRN: [id number]; DOB: 06-06-1934   Admission date: 06/10/2024  Primary Care Provider: Thedora Garnette HERO, MD Primary Cardiologist: New to Dr. Santo Chief Complaint:  NSTEMI  Patient Profile:   Anna [name] is a 89 y.o. female with septic shock.  History of Present Illness:   Anna [name] is an 89 year old with CKD stage 3B who presents with septic shock and elevated troponin levels. She is accompanied by her son at bedsided.  She presented with a temperature of 101.66F, sinus tachycardia with a rate of 142, and elevated lactic acidosis. Her CMP showed a creatinine of 1.21, elevated from her historical baseline of under 1, and an albumin of 2.6. Her lactate [name] was initially 6 and decreased to 5.4 with fluid resuscitation. She has a white blood cell count of 14 and a hemoglobin [name] of 10.6, down from her baseline of 13.9. An AM cortisol [name] was elevated at 38.1.  She has a history of obstructive kidney stones with severe hydronephrosis. Her son notes that she has struggled with kidney stones in the past and that stenting helped during this admission, but she will need definitive therapy.  No current chest pain, shortness of breath, or palpitations.  She has a history of breast cancer and CKD stage 3B. Her current medications include midodrine and Zosyn. She is not on any cardiac medications.  An EKG performed on June 11, 2024, showed sinus rhythm with mild tachycardia and a notable left bundle branch block with rare premature atrial contractions. Previous EKGs from 2023 also showed a left bundle branch block.  No current chest pain, shortness of breath, or palpitations.  Allergies:    Allergies  Allergen Reactions   Demerol [Meperidine] Nausea And Vomiting   Sulfa Antibiotics Nausea And Vomiting    Social History:   Social History   Socioeconomic History   Marital status: Widowed    Spouse name: Not on  file   Number of children: 2   Years of education: Not on file   Highest education [name]: Not on file  Occupational History   Not on file  Tobacco Use   Smoking status: Never   Smokeless tobacco: Never  Vaping Use   Vaping status: Never Used  Substance and Sexual Activity   Alcohol use: No   Drug use: No   Sexual activity: Not Currently  Other Topics Concern   Not on file  Social History Narrative   ** Merged History Encounter **       Right handed Drinks caffeine One story home  Son- Anna [name], Anna [name]. Daughter- Anna [name]   Social Drivers of Health   Financial Resource Strain: Low Risk  (06/21/2023)   Overall Financial Resource Strain (CARDIA)    Difficulty of Paying Living Expenses: Not hard at all  Food Insecurity: No Food Insecurity (06/11/2024)   Hunger Vital Sign    Worried About Running Out of Food in the Last Year: Never true    Ran Out of Food in the Last Year: Never true  Transportation Needs: No Transportation Needs (06/11/2024)   PRAPARE - Administrator, Civil Service (Medical): No    Lack of Transportation (Non-Medical): No  Physical Activity: Inactive (06/21/2023)   Exercise Vital Sign    Days of Exercise per Week: 0 days    Minutes of Exercise per Session: 0 min  Stress: No Stress Concern Present (06/21/2023)   [name] of  Occupational Health - Occupational Stress Questionnaire    Feeling of Stress : Not at all  Social Connections: Moderately Isolated (06/11/2024)   Social Connection and Isolation Panel    Frequency of Communication with Friends and Family: More than three times a week    Frequency of Social Gatherings with Friends and Family: More than three times a week    Attends Religious Services: More than 4 times per year    Active Member of [employer] or Organizations: No    Attends Banker Meetings: Never    Marital Status: Widowed  Intimate Partner Violence: Not At Risk (06/11/2024)   Humiliation,  Afraid, Rape, and Kick questionnaire    Fear of Current or Ex-Partner: No    Emotionally Abused: No    Physically Abused: No    Sexually Abused: No    Family History:   No hx of CAD  ROS:  Please see the history of present illness.   Physical Exam/Data:   Vitals:   06/11/24 0641 06/11/24 0645 06/11/24 0700 06/11/24 0800  BP: (!) 96/40 (!) 107/41 (!) 95/39   Pulse: 100 100 93   Resp: (!) 28 (!) 23 15   Temp:    97.7 F (36.5 C)  TempSrc:    Axillary  SpO2: 96% 96% 95%   Weight:      Height:        Intake/Output Summary (Last 24 hours) at 06/11/2024 0808 Last data filed at 06/11/2024 0701 Gross per 24 hour  Intake 4812.35 ml  Output 550 ml  Net 4262.35 ml   Filed Weights   06/10/24 2337  Weight: 58.4 kg   Body mass index is 26 kg/m.   Gen: ill appearing distress   Neck: No JVD Cardiac: No Rubs or Gallops, systolic Murmur, Regular tachycardia, +2 radial pulses Respiratory: Clear to auscultation bilaterally, normal effort, normal  respiratory rate GI: Soft, nontender, non-distended  MS: No  edema;  moves all extremities Integument: Skin feels warm Neuro:  At time of evaluation, alert and oriented to person/place/time/situation  Psych: Normal affect, patient feels poorly  Relevant CV Studies:  Cardiac Studies & Procedures   ______________________________________________________________________________________________     ECHOCARDIOGRAM  ECHOCARDIOGRAM COMPLETE 03/21/2022  Narrative ECHOCARDIOGRAM REPORT    Patient Name:   Anna [name] Date of Exam: 03/21/2022 Medical Rec #:  [id number]        Height:       59.0 in Accession #:    [id number]       Weight:       130.7 lb Date of Birth:  [birth year]       BSA:          1.539 m Patient Age:    87 years         BP:           121/67 mmHg Patient Gender: F                HR:           92 bpm. Exam Location:  Inpatient  Procedure: 2D Echo, Cardiac Doppler and Color Doppler  Indications:    Chest  Pain  History:        Patient has no prior history of Echocardiogram examinations.  Sonographer:    Elida Casey Referring Phys: [id number] ALLISON WOLFE  IMPRESSIONS   1. Left ventricular ejection fraction, by estimation, is 60 to 65%. The left ventricle has normal function. The left ventricle has no regional wall  motion abnormalities. There is mild left ventricular hypertrophy. Left ventricular diastolic parameters are indeterminate. 2. Right ventricular systolic function is normal. The right ventricular size is normal. There is normal pulmonary artery systolic pressure. The estimated right ventricular systolic pressure is 28.0 mmHg. 3. Left atrial size was mildly dilated. 4. The mitral valve is grossly normal. Trivial mitral valve regurgitation. No evidence of mitral stenosis. 5. The aortic valve is grossly normal. There is mild thickening of the aortic valve. Aortic valve regurgitation is not visualized. No aortic stenosis is present. 6. The inferior vena cava is normal in size with greater than 50% respiratory variability, suggesting right atrial pressure of 3 mmHg.  FINDINGS Left Ventricle: Left ventricular ejection fraction, by estimation, is 60 to 65%. The left ventricle has normal function. The left ventricle has no regional wall motion abnormalities. The left ventricular internal cavity size was normal in size. There is mild left ventricular hypertrophy. Left ventricular diastolic parameters are indeterminate.  Right Ventricle: The right ventricular size is normal. No increase in right ventricular wall thickness. Right ventricular systolic function is normal. There is normal pulmonary artery systolic pressure. The tricuspid regurgitant velocity is 2.50 m/s, and with an assumed right atrial pressure of 3 mmHg, the estimated right ventricular systolic pressure is 28.0 mmHg.  Left Atrium: Left atrial size was mildly dilated.  Right Atrium: Right atrial size was normal in  size.  Pericardium: Trivial pericardial effusion is present.  Mitral Valve: The mitral valve is grossly normal. Mild mitral annular calcification. Trivial mitral valve regurgitation. No evidence of mitral valve stenosis. The mean mitral valve gradient is 4.8 mmHg with average heart rate of 99 bpm.  Tricuspid Valve: The tricuspid valve is normal in structure. Tricuspid valve regurgitation is trivial. No evidence of tricuspid stenosis.  Aortic Valve: The aortic valve is grossly normal. There is mild thickening of the aortic valve. Aortic valve regurgitation is not visualized. Aortic regurgitation PHT measures 340 msec. No aortic stenosis is present. Aortic valve peak gradient measures 9.0 mmHg.  Pulmonic Valve: The pulmonic valve was normal in structure. Pulmonic valve regurgitation is trivial. No evidence of pulmonic stenosis.  Aorta: The aortic root is normal in size and structure.  Venous: The inferior vena cava is normal in size with greater than 50% respiratory variability, suggesting right atrial pressure of 3 mmHg.  IAS/Shunts: There is redundancy of the interatrial septum. The interatrial septum appears to be lipomatous. No atrial [name] shunt detected by color flow Doppler.   LEFT VENTRICLE PLAX 2D LVIDd:         4.10 cm LVIDs:         2.70 cm LV PW:         1.20 cm LV IVS:        1.30 cm LVOT diam:     2.00 cm LV SV:         81 LV SV Index:   52 LVOT Area:     3.14 cm   RIGHT VENTRICLE             IVC RV Basal diam:  2.60 cm     IVC diam: 1.90 cm RV S prime:     18.30 cm/s TAPSE (M-mode): 1.6 cm  LEFT ATRIUM             Index        RIGHT ATRIUM          Index LA diam:        3.30 cm 2.14  cm/m   RA Area:     8.30 cm LA Vol (A2C):   55.7 ml 36.19 ml/m  RA Volume:   13.60 ml 8.84 ml/m LA Vol (A4C):   45.9 ml 29.82 ml/m LA Biplane Vol: 51.8 ml 33.65 ml/m AORTIC VALVE                 PULMONIC VALVE AV Area (Vmax): 2.58 cm     PV Vmax:       0.88 m/s AV Vmax:         150.00 cm/s  PV Peak grad:  3.1 mmHg AV Peak Grad:   9.0 mmHg LVOT Vmax:      123.00 cm/s LVOT Vmean:     82.500 cm/s LVOT VTI:       0.257 m AI PHT:         340 msec  AORTA Ao Root diam: 3.20 cm Ao Asc diam:  3.30 cm  MITRAL VALVE           TRICUSPID VALVE MV Mean grad: 4.8 mmHg TR Peak grad:   25.0 mmHg TR Vmax:        250.00 cm/s  SHUNTS Systemic VTI:  0.26 m Systemic Diam: 2.00 cm  Soyla Merck MD Electronically signed by Soyla Merck MD Signature Date/Time: 03/21/2022/3:17:24 PM    Final          ______________________________________________________________________________________________      LABS Troponin: 29 to 120 CMP: Creatinine 1.21, Albumin 2.6, Lactate 5.4 WBC: 14 Hb: 10.6 AM Cortisol: 38.1  RADIOLOGY CT Chest, Abdomen, and Pelvis: Dependent edema, mitral annular calcification, coronary artery calcifications, mild aortic valve calcification, dilated pulmonary artery  DIAGNOSTIC EKG: Sinus rhythm, mild tachycardia, left bundle branch block, rare premature atrial contractions (06/11/2024)  Laboratory Data:  Chemistry Recent Labs  Lab 06/10/24 1743 06/11/24 0314  NA 142 143  K 3.8 3.6  CL 105 111  CO2 22 18*  GLUCOSE 133* 117*  BUN 16 15  CREATININE 0.88 1.21*  CALCIUM 9.7 8.2*  GFRNONAA >60 43*  ANIONGAP 16* 13    Recent Labs  Lab 06/10/24 1743 06/11/24 0314  PROT 6.5 4.3*  ALBUMIN 3.9 2.6*  AST 31 61*  ALT 18 26  ALKPHOS 110 109  BILITOT 0.8 0.6   Hematology Recent Labs  Lab 06/10/24 1743 06/11/24 0314  WBC 3.5* 14.3*  RBC 4.69 3.65*  HGB 13.9 10.6*  HCT 43.1 35.6*  MCV 91.9 97.5  MCH 29.6 29.0  MCHC 32.3 29.8*  RDW 13.8 14.3  PLT 180 72*    Assessment and Plan:      Septic shock Anna [name] is experiencing septic shock, evidenced by elevated lactic acidosis and hypotension. She presented with a temperature of 101.69F and sinus tachycardia with a rate of 142 bpm. Her condition is being managed  with fluid resuscitation, and she has received about six liters as part of her sepsis protocol. The elevated lactate [name] has decreased from 6 to 5.4 with treatment. Her white blood cell count is elevated at 14.3, and she is receiving Zosyn for infection management. The current management plan is appropriate, and no cardiac interventions are warranted at this time as the condition is consistent with secondary NSTEMI due to sepsis. - Increase fluid resuscitation; I have given an additional bolus  Secondary non-ST elevation myocardial infarction (NSTEMI) Anna [name] has elevated troponin levels, with high sensitivity troponin ranging from 29 to 120, in the setting of sepsis. This is consistent with a secondary NSTEMI. She is  not experiencing chest pain, shortness of breath, or palpitations. Given the septic nature of her condition, no cardiac interventions are deemed necessary at this time. - echo is pending  Acute kidney injury on chronic kidney disease stage 3B Anna [name] has CKD stage 3B and is currently experiencing acute kidney injury, as indicated by a creatinine [name] of 1.21, which is elevated from her historical baseline of under 1. This is likely exacerbated by her current septic state. - IVF as above  Anemia, likely dilutional Anna [name] is 10.6, down from her baseline of 13.9. This anemia is likely dilutional, related to the fluid resuscitation she has received as part of her sepsis management.  Thrombocytopenia, likely dilutional Anna [name] has developed new thrombocytopenia following fluid resuscitation. This is likely dilutional in nature, similar to her anemia. - no plans for inpatient ischemic w/u at this time  Protein-calorie malnutrition Anna [name] is experiencing protein-calorie malnutrition, as indicated by a decrease in albumin from 3.9 to 2.6. Intensive nutritional support may be required to address this issue.   Obstructive kidney stones with severe hydronephrosis Anna [name]  has obstructive kidney stones with severe hydronephrosis. Her son notes that stenting helped during this admission, but she will need definitive therapy for this condition. - Continue management by internal medicine and urology    For questions or updates, please contact [facility] Please consult [email or web address] for contact info under Cardiology/STEMI.   Stanly Leavens, MD FASE [facility] Cardiologist [facility]  [address] [place], [address] [place], [place] [address] [phone]  8:08 AM  Called for change in clinical status: - lactate has increased - developed worsening shortness of breath with increase O2 requirements. - notes feels more short of breath - new tachypnea  Key diagnostics - CXR- increased pulmonary venous congestion, increased from 06/10/24 - Lactate has increased from 5.1 to 5.7  AP:  - Discussed with CC Nursing; I am concerned that this represents worsening septic shock and patient may need GDMT.  BP 117/43. CC nursing to reach out to primary team - worsening hypoxic respiratory failure; I have stopped his maintenance fluid pending his sepsis eval - Stat Echo: Discussed with CC nursing, Bernarda Rocks; will make sure no evidence of heart failure; if not will need aggressive sepsis support as per primary or critical care.  Stanly Leavens, MD FASE [facility] Cardiologist [facility]  [address] [place], [place] [address] [phone]  3:55 PM

## 2024-06-11 NOTE — Evaluation (Signed)
 SLP Cancellation Note  Patient Details Name: Anna [name] MRN: [id number] DOB: 1934/04/09   Cancelled treatment:       Reason Eval/Treat Not Completed: Other (comment);Medical issues which prohibited therapy (pt with edema of tongue and face per RN, will defer eval until next date)  Anna POUR, MS [facility] SLP Acute Rehab Services Office [phone]   Anna [name] 06/11/2024, 4:16 PM

## 2024-06-11 NOTE — Anesthesia Procedure Notes (Signed)
 Procedure Name: LMA Insertion Date/Time: 06/11/2024 1:35 AM  Performed by: Claudene Hoy CROME, CRNAPre-anesthesia Checklist: Patient identified, Emergency Drugs available, Suction available and Patient being monitored Patient Re-evaluated:Patient Re-evaluated prior to induction Oxygen Delivery Method: Circle System Utilized Preoxygenation: Pre-oxygenation with 100% oxygen Induction Type: IV induction Ventilation: Mask ventilation without difficulty LMA: LMA inserted LMA Size: 3.0 Number of attempts: 1 Placement Confirmation: positive ETCO2 Tube secured with: Tape Dental Injury: Teeth and Oropharynx as per pre-operative assessment

## 2024-06-11 NOTE — Anesthesia Postprocedure Evaluation (Signed)
 Anesthesia Post Note  Patient: Anna [name]  Procedure(s) Performed: ERNESTA, WITH RETROGRADE PYELOGRAM AND STENT INSERTION (Right: Ureter)     Patient location during evaluation: PACU Anesthesia Type: General Level of consciousness: awake and alert Pain management: pain level controlled Vital Signs Assessment: post-procedure vital signs reviewed and stable Respiratory status: spontaneous breathing, nonlabored ventilation, respiratory function stable and patient connected to nasal cannula oxygen Cardiovascular status: blood pressure returned to baseline and stable Postop Assessment: no apparent nausea or vomiting Anesthetic complications: no   No notable events documented.  Last Vitals:  Vitals:   06/11/24 0215 06/11/24 0220  BP: (!) 96/48   Pulse: (!) 116 (!) 118  Resp: 15 18  Temp:    SpO2: 94% 95%    Last Pain:  Vitals:   06/11/24 0215  TempSrc:   PainSc: 0-No pain                 Butler Levander Pinal

## 2024-06-11 NOTE — Op Note (Signed)
 OPERATIVE NOTE   Patient Name: Anna [name]  MRN: [id number]   Date of Procedure: 06/11/24    Preoperative diagnosis:  Right ureteral calculus with obstruction Sepsis  Postoperative diagnosis:  Right ureteral calculus with obstruction Sepsis  Procedure:  Cystoscopy Right retrograde pyelogram with intraoperative interpretation Insertion of right ureteral stent (14F x 24 cm, tether removed)  Attending: Adine DOROTHA Manly, MD  Anesthesia: General  Estimated blood loss: None  Fluids: Per anesthesia record  Drains: 14F x 24 cm right ureteral stent, tether removed; 16 French Foley catheter  Specimens: Urine from right renal pelvis for culture  Antibiotics: Zosyn 3.375 gm IV  Findings:  - diffuse areas of erythema in bladder consistent with cystitis - 6 mm calcification in distal right ureter - significant dilation of right ureter and right renal pelvis  Indications:  89 year old female presents for surgical management of a right ureteral calculus with obstruction and sepsis.  She presented to the emergency room yesterday evening with right back pain and dysuria.  She also had generalized fatigue and nausea.  She was hypotensive and tachycardic in the ER.  She was found to have a lactic acidosis.  CT demonstrated a 6 mm calculus in the right distal ureter with severe right hydroureteronephrosis.  She received IV Zosyn.  Her blood pressure has improved with IV fluids.  She presents now for cystoscopy with right retrograde pyelogram, and insertion of right ureteral stent.  The procedure including potential risk discussed with the patient and her family.  They understand and wish to proceed as described.   Description of Procedure:  The patient was taken to the operative suite and properly identified.  After successful induction of a general anesthetic, she was placed in the dorsolithotomy position.  The patient's genital area was prepped and draped in sterile fashion.  A  preoperative timeout was performed.  Under direct visualization, a 21 French rigid cystoscope was passed through the urethra and into the bladder.  There were areas of erythema scattered throughout the bladder consistent with cystitis.  There was debris noted within the urine as well.  A normal-appearing trigone was seen with a single orifice bilaterally.  No mucosal lesions were seen.  Scout film demonstrated a nonspecific bowel gas pattern with no obvious bony abnormalities.  A 6 mm calcification was seen in the right pelvis consistent with the known distal ureteral calculus.  Contrast was seen in the dilated right renal pelvis.  A sensor guidewire was passed into the right ureter under cystoscopic and fluoroscopic guidance.  An open-ended catheter was passed over the guidewire and into the distal ureter.  Injection of contrast confirmed the calcification to be within the distal ureter.  There was dilation of the ureter proximally extending to the dilated right renal pelvis.  The open-ended catheter was passed all the way into the right renal pelvis.  A hydronephrotic drip was noted.  The urine was dark yellow with some debris but was not obviously purulent.  A urine sample was sent for culture.  The guidewire was replaced.  A 6 French by 24 cm double-J stent was then passed over the guidewire.  Position was confirmed with fluoroscopy.  There was efflux of urine noted through and around the stent.  The cystoscope was removed.  A 16 French Foley catheter was placed with 10 mL of sterile water in the balloon.  The patient was then extubated and taken to the postanesthesia care unit in stable condition.  Complications: None  Condition: Stable,  extubated, transferred to PACU  Plan:  Continue right ureteral stent Continue Foley x 1-2 days to promote drainage of the right kidney. Continue IV antibiotics pending culture results Will need outpatient follow-up to arrange for definitive treatment of right ureteral  calculus

## 2024-06-11 NOTE — H&P (Signed)
 History and Physical    Patient: Anna [name] FMW:[id number] DOB: 12-15-1933 DOA: 06/10/2024 DOS: the patient was seen and examined on 06/11/2024 PCP: Thedora Garnette HERO, MD  Patient coming from: Home  Chief Complaint:  Chief Complaint  Patient presents with   Dysuria   HPI: Anna [name] is a 89 y.o. female with medical history significant of  obstructive kidney stones, asthma, history of breast cancer, seasonal allergies, chronic kidney disease stage III, osteoarthritis, presents with flank pain.  Workup in the ER reveals right obstructive kidney stone with severe hydronephrosis, seen on CT scan.  Met SIRS criteria on arrival.  Heart rate in the 140s initially.  After volume resuscitation, heart rate has come down to the 110s.  Troponin 29 up trended to the 120s.  EDP discussed the case with cardiology who reviewed twelve-lead EKG.  Suspects demand ischemia.  EDP also discussed the case with urology.  Plan for intervention tonight.  The patient transferred urgently to [facility] for likely ureteral stent placement.  Patient suspected to have sepsis and started on broad-spectrum antibiotics.  She meets sepsis criteria with a temperature of 101.8, white count 3.5, tachycardia with a heart rate of 142, urinalysis is borderline positive.  Patient also has lactic acidosis.  Will admit to stepdown unit with urology consultation.  Review of Systems: As mentioned in the history of present illness. All other systems reviewed and are negative. Past Medical History:  Diagnosis Date   Allergy    seasonal also with dogs and cats   Arthritis    Asthma    hx of in childhood    Bladder infection    hx of    Cancer (HCC)    breast cancer bilat has had double mastectomy    Chronic kidney disease    hx of kidney stones last one pasted 1 month ago    COVID    Fibrocystic breast disease    H/O blood clots    during delivery occurred 50 years ago per left arm    PONV (postoperative  nausea and vomiting)    pt states stays very sleepy for days following anesthesia    Past Surgical History:  Procedure Laterality Date   ABDOMINAL HYSTERECTOMY     APPENDECTOMY     BACK SURGERY     disc repaired 20 years ago    BREAST SURGERY     double mastectomy    CESAREAN SECTION     times 2   colonscopy      cyst repaired      following mammogram / cyst ruptured had to surgically repair area / left side    DILATION AND CURETTAGE OF UTERUS     EYE SURGERY     bilat cataract surgery    HAND SURGERY     left hand / joint issues with thumb    LUNG SURGERY     related to fatty tumor / left approx 12 years ago    right knee arthroscopy      TOTAL KNEE ARTHROPLASTY Right 01/05/2015   Procedure: RIGHT TOTAL KNEE ARTHROPLASTY;  Surgeon: Tanda Heading, MD;  Location: [facility] OR[facility];  Service: Orthopedics;  Laterality: Right;   Social History:  reports that she has never smoked. She has never used smokeless tobacco. She reports that she does not drink alcohol and does not use drugs.  Allergies  Allergen Reactions   Demerol [Meperidine] Nausea And Vomiting   Sulfa Antibiotics Nausea And Vomiting  History reviewed. No pertinent family history.  Prior to Admission medications   Medication Sig Start Date End Date Taking? Authorizing Provider  ascorbic acid (VITAMIN C) 500 MG tablet Take 500 mg by mouth daily.    [provider]  azelastine  (ASTELIN ) 0.1 % nasal spray Place 1 spray into both nostrils 2 (two) times daily. Use in each nostril as directed 12/20/23   Thedora Garnette HERO, MD  b complex vitamins capsule Take 1 capsule by mouth daily.    [provider]  cetirizine (ZYRTEC) 10 MG tablet Take 10 mg by mouth every evening.    [provider]  gabapentin  (NEURONTIN ) 300 MG capsule Take 1 capsule (300 mg total) by mouth at bedtime. 05/28/23   Thedora Garnette HERO, MD  latanoprost  (XALATAN ) 0.005 % ophthalmic solution Place 1 drop into both eyes every evening. 02/14/22    [provider]  Lifitegrast  (XIIDRA ) 5 % SOLN Place 1 drop into both eyes 2 (two) times daily.    [provider]  Lutein  10 MG TABS Take 1 tablet by mouth daily.    [provider]  Multiple Vitamins-Minerals (PRESERVISION AREDS 2 PO) Take 1 tablet by mouth in the morning and at bedtime.    [provider]  Omega-3 1000 MG CAPS Take 1 capsule by mouth daily.    [provider]  triamcinolone (NASACORT ALLERGY 24HR) 55 MCG/ACT AERO nasal inhaler Place 2 sprays into the nose daily.    [provider]    Physical Exam: Vitals:   06/10/24 1902 06/10/24 2030 06/10/24 2200 06/10/24 2337  BP: (!) 152/74 (!) 109/53 (!) 106/51 (!) 126/52  Pulse: (!) 142 (!) 119 (!) 115 (!) 122  Resp: 20 (!) 26 (!) 26 (!) 25  Temp: (!) 101.8 F (38.8 C)   98.4 F (36.9 C)  TempSrc: Rectal   Oral  SpO2: 95% 97% 96% 90%  Weight:    58.4 kg  Height:    4' 11 (1.499 m)   Constitutional: Acutely ill looking, NAD, calm, comfortable Eyes: PERRL, lids and conjunctivae normal ENMT: Mucous membranes are dry.. Posterior pharynx clear of any exudate or lesions.Normal dentition.  Neck: normal, supple, no masses, no thyromegaly Respiratory: clear to auscultation bilaterally, no wheezing, no crackles. Normal respiratory effort. No accessory muscle use.  Cardiovascular: Sinus tachycardia, no murmurs / rubs / gallops. No extremity edema. 2+ pedal pulses. No carotid bruits.  Abdomen: no tenderness, no masses palpated. No hepatosplenomegaly. Bowel sounds positive.  Musculoskeletal: Good range of motion, no joint swelling or tenderness, Skin: no rashes, lesions, ulcers. No induration Neurologic: CN 2-12 grossly intact. Sensation intact, DTR normal. Strength 5/5 in all 4.  Psychiatric: Normal judgment and insight. Alert and oriented x 3. Normal mood  Data Reviewed:  Temperature 101.8, blood pressure 106/51, pulse 142, respirate 36, white count 3.5, the rest of the CBC  within normal.  Lactic acid 4.8, troponin went from 29-1 58.  Urinalysis showed few bacteria but WBC 6-10 negative nitrite.  Chest x-ray showed diffuse interstitial opacities with bronchial wall thickening.  CT chest abdomen pelvis showed 6 mm distal right ureteral calculus just above the UVJ.  There is associated severe right hydronephrosis.  Also a 5 mm nonobstructing right lower pole renal calculus.  Assessment and Plan:  #1 sepsis due to UTI: Urinalysis not impressive but indicated of possible infection.  Patient meets sepsis criteria as a result.  Will admit and follow sepsis criteria.  Follow lactic acid level.  IV cefepime  started.  Fluid resuscitation.  Address underlying cause.  #2 right nephrolithiasis with hydronephrosis: Patient has a 6 mm and 5 mm renal stones on the left.  Plan is to have cystoscopy with stent placement tonight.  Patient is medically cleared for the procedure.  Most likely infected stone.  #3 elevated troponins: Most likely demand ischemia from sepsis.  Continue to trend troponin.  Will get echocardiogram in the morning.  Cardiology already consulted and believe it was demand ischemia.  #4 osteomyelitis of multiple joints: Stable at this point with no complaint.  #5 history of breast cancer: In remission.  Continue monitoring.      Advance Care Planning:   Code Status: Prior full code  Consults: Dr. Roseann, urology  Family Communication: Daughter at bedside  Severity of Illness: The appropriate patient status for this patient is INPATIENT. Inpatient status is judged to be reasonable and necessary in order to provide the required intensity of service to ensure the patient's safety. The patient's presenting symptoms, physical exam findings, and initial radiographic and laboratory data in the context of their chronic comorbidities is felt to place them at high risk for further clinical deterioration. Furthermore, it is not anticipated that the patient will be  medically stable for discharge from the hospital within 2 midnights of admission.   * I certify that at the point of admission it is my clinical judgment that the patient will require inpatient hospital care spanning beyond 2 midnights from the point of admission due to high intensity of service, high risk for further deterioration and high frequency of surveillance required.*  AuthorBETHA SIM KNOLL, MD 06/11/2024 12:33 AM  For on call review [email or web address].

## 2024-06-12 ENCOUNTER — Encounter (HOSPITAL_COMMUNITY): Payer: Self-pay | Admitting: Urology

## 2024-06-12 DIAGNOSIS — J9601 Acute respiratory failure with hypoxia: Secondary | ICD-10-CM

## 2024-06-12 DIAGNOSIS — A498 Other bacterial infections of unspecified site: Secondary | ICD-10-CM

## 2024-06-12 DIAGNOSIS — R6520 Severe sepsis without septic shock: Secondary | ICD-10-CM | POA: Diagnosis not present

## 2024-06-12 DIAGNOSIS — A419 Sepsis, unspecified organism: Secondary | ICD-10-CM | POA: Diagnosis not present

## 2024-06-12 LAB — CBC
HCT: 38.3 % (ref 36.0–46.0)
Hemoglobin: 12 g/dL (ref 12.0–15.0)
MCH: 29.1 pg (ref 26.0–34.0)
MCHC: 31.3 g/dL (ref 30.0–36.0)
MCV: 93 fL (ref 80.0–100.0)
Platelets: 76 K/uL — ABNORMAL LOW (ref 150–400)
RBC: 4.12 MIL/uL (ref 3.87–5.11)
RDW: 14.9 % (ref 11.5–15.5)
WBC: 34 K/uL — ABNORMAL HIGH (ref 4.0–10.5)
nRBC: 0 % (ref 0.0–0.2)

## 2024-06-12 LAB — BASIC METABOLIC PANEL WITH GFR
Anion gap: 12 (ref 5–15)
BUN: 23 mg/dL (ref 8–23)
CO2: 20 mmol/L — ABNORMAL LOW (ref 22–32)
Calcium: 8.7 mg/dL — ABNORMAL LOW (ref 8.9–10.3)
Chloride: 110 mmol/L (ref 98–111)
Creatinine, Ser: 1 mg/dL (ref 0.44–1.00)
GFR, Estimated: 54 mL/min — ABNORMAL LOW (ref >60)
Glucose, Bld: 102 mg/dL — ABNORMAL HIGH (ref 70–99)
Potassium: 4.4 mmol/L (ref 3.5–5.1)
Sodium: 142 mmol/L (ref 135–145)

## 2024-06-12 LAB — LACTIC ACID, PLASMA: Lactic Acid, Venous: 2.3 mmol/L (ref 0.5–1.9)

## 2024-06-12 MED ORDER — MELATONIN 5 MG PO TABS: 5.0000 mg | ORAL_TABLET | Freq: Once | ORAL | Status: DC

## 2024-06-12 MED ORDER — HALOPERIDOL LACTATE 5 MG/ML IJ SOLN
1.0000 mg | Freq: Once | INTRAMUSCULAR | Status: AC
  Administered 2024-06-12: 1 mg via INTRAVENOUS
  Filled 2024-06-12: qty 1

## 2024-06-12 MED ORDER — MELATONIN 5 MG PO TABS
5.0000 mg | ORAL_TABLET | Freq: Every evening | ORAL | Status: DC | PRN
  Administered 2024-06-12: 5 mg via ORAL
  Filled 2024-06-12: qty 1

## 2024-06-12 MED ORDER — IPRATROPIUM-ALBUTEROL 0.5-2.5 (3) MG/3ML IN SOLN
3.0000 mL | Freq: Four times a day (QID) | RESPIRATORY_TRACT | Status: DC
  Administered 2024-06-12 – 2024-06-13 (×6): 3 mL via RESPIRATORY_TRACT
  Filled 2024-06-12 (×7): qty 3

## 2024-06-12 MED ORDER — FUROSEMIDE 10 MG/ML IJ SOLN
20.0000 mg | Freq: Once | INTRAMUSCULAR | Status: AC
  Administered 2024-06-12: 20 mg via INTRAVENOUS
  Filled 2024-06-12: qty 2

## 2024-06-12 MED ORDER — ACETAMINOPHEN 650 MG RE SUPP
650.0000 mg | Freq: Once | RECTAL | Status: AC
  Administered 2024-06-12: 650 mg via RECTAL
  Filled 2024-06-12: qty 1

## 2024-06-12 NOTE — Plan of Care (Signed)
  Problem: Clinical Measurements: Goal: Ability to maintain clinical measurements within normal limits will improve Outcome: Progressing Goal: Respiratory complications will improve Outcome: Progressing   Problem: Education: Goal: Knowledge of General Education information will improve Description: Including pain rating scale, medication(s)/side effects and non-pharmacologic comfort measures Outcome: Not Progressing

## 2024-06-12 NOTE — Progress Notes (Signed)
 PROGRESS NOTE Anna [name]  FMW:[id number] DOB: [birth year] DOA: 06/10/2024 PCP: Thedora Garnette HERO, MD  Brief Narrative/Hospital Course: Anna [name] is a 89 y.o. female with PMH of obstructive kidney stones, asthma, history of breast cancer, seasonal allergies, chronic kidney disease stage III, osteoarthritis, presents with right back pain, dysuria and generalized fatigue nausea without gross hematuria. In the ED found to have severe sepsis with hypotension, tachycardia lactic acidosis. Patient was resuscitated with IV fluids 2 L, antibiotics. Labs- troponin 29 >158,  la 4.8>4>6, UA leukocyte esterase WBC 6-10 CT chest and pelvis with contrast >>6 mm distal right ureteral calculus,severe right hydroureteronephrosis. 2. 5 mm nonobstructing right lower pole renal calculus.3. No acute findings in the chest EDP discussed with cardiology reviewed EKG felt demand ischemia Urology consulted and transferred to [facility] urgently for ureteral stent placement. S/p cystoscopy right ureteral stent Postprocedure hypotensive lethargic worsening lactic acidosis-received  albumin 25 g, midodrine x 2 and 1 l bolus IVF  Subjective: Seen and examined Confused on bedside chair answerd year month president correcntly Overnight tachycardia, patient has been placed on HHFNC due to work of breathing and shortness of breath Labs reviewed lactic acid down to 2.3 leukocytosis 34K BMP stable, thrombocytopenia 180>72>74.  Assessment and plan:  Severe sepsis/septic shock with lactate more than 4 due to obstructive right  ureteral calculus 6 mm w/ severe right hydronephrosis Blood cultures 4/4 Klebsiella oxytoca Klebsiella oxytoc UTI: S/p cystoscopy with right ureteral stent placement, along with aggressive fluid hydration albumin and antibiotics Vital signs stabilized although he remains tachycardic, hypoxic.  Lactic acidosis resolved from 6> 2.3 Blood culture 4/4 Klebsiella oxytoca> changed Zosyn to  ceftriaxone  10/16>> PCCM, neurology following Recent Labs  Lab 06/10/24 1743 06/10/24 1744 06/11/24 0314 06/11/24 0625 06/11/24 0754 06/11/24 1259 06/11/24 1821 06/12/24 0309 06/12/24 0607  WBC 3.5*  --  14.3*  --   --   --   --  34.0*  --   LATICACIDVEN  --    < > 6.0* 5.4* 5.1* 5.7* 3.8*  --  2.3*   < > = values in this interval not displayed.   Elevated troponin from demand ischemia due to sepsis: Cardiology was consulted, echo reassuring with no WMA , treat underlying etiology.    Acute metabolic encephalopathy: In the setting of sepsis.  Keep in delirium precaution. Able to converse well about appears confused intermittently.Keep on fall precautions.  Acute respiratory failure with hypoxia: Pulmonary edema versus compensation for metabolic acidosis.  Overnight placed on HHFNC due to work of breathing and shortness of breath. Question mild bronchospasm, pulmonary has been consulted 10/16.  Of note she had recent COVID infection.  AKI: resolved.    Leukocytosis: In the setting of sepsis also received dose of steroid.  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: seeing derm q 65mo and under control  Deconditioning/debility Recent falls:  ambulates with cane and recent covid 6 wk and deconditinoned and slow since then   DVT prophylaxis: enoxaparin  (LOVENOX ) injection 30 mg Start: 06/11/24 1003 Code Status:   Code Status: Limited: Do not attempt resuscitation (DNR) -DNR-LIMITED -Do Not Intubate/DNI  Family Communication: plan of care discussed with patient, and her daughter at the bedside Patient status is: Remains hospitalized because of severity of illness Level of care: Stepdown  as she is critically ill  Dispo: The patient is from: HOME with her son  Anticipated disposition: TBD Objective: Vitals last 24  hrs: Vitals:   06/12/24 0500 06/12/24 0600 06/12/24 0918 06/12/24 0958  BP: (!) 124/56 (!) 113/95    Pulse: (!) 102 (!) 115    Resp: (!) 22 (!) 32    Temp:   99.7 F (37.6 C)   TempSrc:   Oral   SpO2: 95% 96%  96%  Weight:      Height:        Physical Examination: General exam: alert awake oriented to 2-3 but agitated/intermittently confused HEENT:Oral mucosa moist, Ear/Nose WNL grossly Respiratory system: Bilaterally menaced at base  Cardiovascular system: S1 & S2 +, No JVD. Gastrointestinal system: Abdomen soft,NT,ND, BS+ Nervous System: Alert, awake, moving all extremities,and following commands. Extremities: extremities warm, leg edema mild Skin: Warm, no rashes MSK: Normal muscle bulk,tone, power   Medications reviewed:  Scheduled Meds:  artificial tears  1 drop Both Eyes BID   azelastine   1 spray Each Nare BID   Chlorhexidine  Gluconate Cloth  6 each Topical Daily   enoxaparin  (LOVENOX ) injection  30 mg Subcutaneous Q24H   ipratropium-albuterol  3 mL Nebulization Q6H   latanoprost   1 drop Both Eyes QPM   melatonin  5 mg Oral Once   midodrine  10 mg Oral TID WC   Continuous Infusions:  cefTRIAXone  (ROCEPHIN )  IV Stopped (06/12/24 1100)   Diet: Diet Order             Diet regular Fluid consistency: Thin  Diet effective now                    Data Reviewed: I have personally reviewed following labs and imaging studies ( see epic result tab) CBC: Recent Labs  Lab 06/10/24 1743 06/11/24 0314 06/12/24 0309  WBC 3.5* 14.3* 34.0*  NEUTROABS 3.2  --   --   HGB 13.9 10.6* 12.0  HCT 43.1 35.6* 38.3  MCV 91.9 97.5 93.0  PLT 180 72* 76*   CMP: Recent Labs  Lab 06/10/24 1743 06/11/24 0314 06/11/24 1821 06/12/24 0309  NA 142 143 141 142  K 3.8 3.6 4.1 4.4  CL 105 111 110 110  CO2 22 18* 18* 20*  GLUCOSE 133* 117* 101* 102*  BUN 16 15 20 23   CREATININE 0.88 1.21* 0.91 1.00  CALCIUM 9.7 8.2* 8.4* 8.7*   GFR: Estimated Creatinine Clearance: 30.8  mL/min (by C-G formula based on SCr of 1 mg/dL). Recent Labs  Lab 06/10/24 1743 06/11/24 0314  AST 31 61*  ALT 18 26  ALKPHOS 110 109  BILITOT 0.8 0.6  PROT 6.5 4.3*  ALBUMIN 3.9 2.6*   No results for input(s): LIPASE, AMYLASE in the last 168 hours. No results for input(s): AMMONIA in the last 168 hours. Coagulation Profile:  Recent Labs  Lab 06/10/24 1743 06/11/24 0314  INR 1.0 1.6*   Unresulted Labs (From admission, onward)     Start     Ordered   06/18/24 0500  Creatinine, serum  (enoxaparin  (LOVENOX )    CrCl >/= 30 ml/min)  Weekly,   R     Comments: while on enoxaparin  therapy    06/11/24 0033   06/12/24 0500  Basic metabolic panel with GFR  Daily,   R     Question:  Specimen collection method  Answer:  Lab=Lab collect   06/11/24 0721   06/12/24 0500  CBC  Daily,   R     Question:  Specimen collection method  Answer:  Lab=Lab  collect   06/11/24 0721           Antimicrobials/Microbiology: Anti-infectives (From admission, onward)    Start     Dose/Rate Route Frequency Ordered Stop   06/11/24 1000  cefTRIAXone  (ROCEPHIN ) 2 g in sodium chloride  0.9 % 100 mL IVPB        2 g 200 mL/hr over 30 Minutes Intravenous Every 24 hours 06/11/24 0851     06/11/24 0200  piperacillin-tazobactam (ZOSYN) IVPB 3.375 g  Status:  Discontinued        3.375 g 12.5 mL/hr over 240 Minutes Intravenous Every 8 hours 06/10/24 2129 06/11/24 0851   06/11/24 0130  ceFEPIme (MAXIPIME) 2 g in sodium chloride  0.9 % 100 mL IVPB  Status:  Discontinued        2 g 200 mL/hr over 30 Minutes Intravenous  Once 06/11/24 0033 06/11/24 0039   06/10/24 1915  azithromycin  (ZITHROMAX ) 500 mg in sodium chloride  0.9 % 250 mL IVPB        500 mg 250 mL/hr over 60 Minutes Intravenous  Once 06/10/24 1907 06/10/24 2043   06/10/24 1745  piperacillin-tazobactam (ZOSYN) IVPB 3.375 g        3.375 g 100 mL/hr over 30 Minutes Intravenous  Once 06/10/24 1738 06/10/24 1845         Component Value Date/Time    SDES  06/11/2024 0148    URINE, RANDOM Performed at [facility], [address], [place], [place] [address]    [facility]  06/11/2024 0148    Urine cystoscope Performed at [facility], [address] [place], [place] [address]    CULT (A) 06/11/2024 0148    >=100,000 COLONIES/mL KLEBSIELLA OXYTOCA SUSCEPTIBILITIES TO FOLLOW Performed at [facility], [address] [place], [place] [address]    REPTSTATUS PENDING 06/11/2024 0148    Procedures: Procedure(s) (LRB): CYSTOURETEROSCOPY, WITH RETROGRADE PYELOGRAM AND STENT INSERTION (Right)   Mennie LAMY, MD [facility] 06/12/2024, 11:29 AM

## 2024-06-12 NOTE — Progress Notes (Addendum)
 NAME:  Anna [name], MRN:  [id number], DOB:  1934/02/27, LOS: 2 ADMISSION DATE:  06/10/2024, CONSULTATION DATE:  06/11/24 REFERRING MD:  Dr. Christobal, CHIEF COMPLAINT:  rising lactic   History of Present Illness:   63 yoF with PMH as below significant for asthma, CKD III, and obstructive kidney stones who presented 10/15 with right back pain, fever, confusion, fatigue, and nausea since Wednesday am presented found to have severe sepsis due to right obstructive ureteral calculus with severe hydronephrosis with initial lactic acidosis and hypotension which improved with IV fluids, abx, and midodrine.  Underwent cystoscopy with right ureteral stent placement 10/16 am with urology.  BC showing GNR with Klebseilla oxytoca.  Broad spectrum abx narrowed to ceftriaxone .  Cardiology consulted for elevated troponin felt secondary to demand ischemia.  Worsening SOB throughout the day but stable O2 requirements on 2L.  Lactic trending up, 6> 5.4> 5.1>5.7, and concern swelling around lips and tongue.  Solumedrol x 1 given.  CXR showing bibasilar atelectasis and chronic interstitial changes.  BP remains stable and afebrile throughout the day.  Pt currently denies any pain including CP, nausea, or obvious feeling of tongue or lip swelling.  Feels confused.  C/o of worsening SOB with tachypnea this afternoon and feels confused, can't get her words out.  Remains otherwise non focal neurologically.  Has felt more congestion, no cough, since having COVID 4 weeks ago and reports ongoing allergies.  SARS/ flu/ RSV was negative on admit.  PCCM consulted for further evaluation.   Pertinent  Medical History   Past Medical History:  Diagnosis Date   Allergy    seasonal also with dogs and cats   Arthritis    Asthma    hx of in childhood    Bladder infection    hx of    Cancer (HCC)    breast cancer bilat has had double mastectomy    Chronic kidney disease    hx of kidney stones last one pasted 1 month ago    COVID     Fibrocystic breast disease    H/O blood clots    during delivery occurred 50 years ago per left arm    PONV (postoperative nausea and vomiting)    pt states stays very sleepy for days following anesthesia    Significant Hospital Events: Including procedures, antibiotic start and stop dates in addition to other pertinent events   10/16 admitted, s/p ureteral stent, cards/ PCCM consult  Interim History / Subjective:  More confusion and intermittent restlessness overnight, did not sleep much BP remains stable, has been NPO, no midodrine since lunch yest Remains on HHFNC 50%, 40L, sats 93-96, still tachypneic  Afebrile, WBC up 14> 34  Pt not c/o of SOB- feels about the same, c/o of chronic back pain/ hx scoliosis   Objective    Blood pressure (!) 113/95, pulse (!) 115, temperature 98.1 F (36.7 C), temperature source Axillary, resp. rate (!) 32, height 4' 11 (1.499 m), weight 58.4 kg, SpO2 96%.    FiO2 (%):  [55 %] 55 %   Intake/Output Summary (Last 24 hours) at 06/12/2024 0828 Last data filed at 06/12/2024 0407 Gross per 24 hour  Intake 1915.47 ml  Output 900 ml  Net 1015.47 ml   Filed Weights   06/10/24 2337  Weight: 58.4 kg   Examination: General:  elderly female sitting upright in bed HEENT: MM pink/less dry, better speech today Neuro: Awake, oriented to self, hospital and year, MAE- generalized weakness/ non focal.  Restless but redirectable CV: rr, ST PULM:  tachypneic but non labored/ no accessory muscle use, clear anteriorly, posterior base rales, no wheeze Extremities: warm/dry, trace ankle edema  Skin: no rashes  UOP 954ml/ 24hrs Net +5.4L Labs> sCr 1.21>1, lactic down trending 3.8> 2.3, WBC 14.3> 34  Resolved problem list   Assessment and Plan   Severe sepsis secondary to right obstructive ureteral calculus s/p stent placement 10/15 am with GNR/ klebsiella oxytoca bacteremia  Lactic acidosis/ NAGMA Elevated troponin with suspected demand  ischemia Thrombocytopenia- likely related to severe sepsis +/- dilutional component  - trop 29> 120, EKG - 10/16 Echo: LVEF 60-65%, G1DD. RV normal size and function; moderately elevated PASP 53. Mildly elevated LA. Mild MR, mild to moderate MS. Mild A  P:  - maps stable despite not able to take midodrine while NPO - improving lactic, renal function and stable UOP - cont ceftriaxone  - cardiology following.  No plans for TEE at this time given bacteremia - urology following  Hypoxic respiratory failure- multifactorial in sepsis, atelectasis, and acidosis driving tachypnea, pain, possibly some fluid overload.   - Can't rule out underlying ILD process with mosaic pattern, evidence of gas trapping and subpleural scarring/ atelectasis seen on CT 10/15 and previous CT 02/2022 P:  - cont HHFNC to prevent resp fatigue - goal sat > 92% - cont aggressive IS, mobilize, pulm hygiene - consider albuterol if worsening SOB to see if symptom relief - lasix 20mg  x 1  - SLP eval pending  - continue zyrtec/ astelin  - ideally needs output pulmonary followup - DNI  Septic encephalopathy - supportive care, delirium precautions, PT/ OT.  Remains non focal. Consider melatonin tonight.  Ideally avoid narcotics, tylenol  preferred  Remainder per TRH, PCCM will continue to follow.  Son and daughter updated at bedside.   Labs   CBC: Recent Labs  Lab 06/10/24 1743 06/11/24 0314 06/12/24 0309  WBC 3.5* 14.3* 34.0*  NEUTROABS 3.2  --   --   HGB 13.9 10.6* 12.0  HCT 43.1 35.6* 38.3  MCV 91.9 97.5 93.0  PLT 180 72* 76*    Basic Metabolic Panel: Recent Labs  Lab 06/10/24 1743 06/11/24 0314 06/11/24 1821 06/12/24 0309  NA 142 143 141 142  K 3.8 3.6 4.1 4.4  CL 105 111 110 110  CO2 22 18* 18* 20*  GLUCOSE 133* 117* 101* 102*  BUN 16 15 20 23   CREATININE 0.88 1.21* 0.91 1.00  CALCIUM 9.7 8.2* 8.4* 8.7*   GFR: Estimated Creatinine Clearance: 30.8 mL/min (by C-G formula based on SCr of 1  mg/dL). Recent Labs  Lab 06/10/24 1743 06/10/24 1744 06/11/24 0314 06/11/24 0625 06/11/24 0754 06/11/24 1259 06/11/24 1821 06/12/24 0309 06/12/24 0607  WBC 3.5*  --  14.3*  --   --   --   --  34.0*  --   LATICACIDVEN  --    < > 6.0*   < > 5.1* 5.7* 3.8*  --  2.3*   < > = values in this interval not displayed.    Liver Function Tests: Recent Labs  Lab 06/10/24 1743 06/11/24 0314  AST 31 61*  ALT 18 26  ALKPHOS 110 109  BILITOT 0.8 0.6  PROT 6.5 4.3*  ALBUMIN 3.9 2.6*   No results for input(s): LIPASE, AMYLASE in the last 168 hours. No results for input(s): AMMONIA in the last 168 hours.  ABG No results found for: PHART, PCO2ART, PO2ART, HCO3, TCO2, ACIDBASEDEF, O2SAT   Coagulation Profile: Recent  Labs  Lab 06/10/24 1743 06/11/24 0314  INR 1.0 1.6*    Cardiac Enzymes: No results for input(s): CKTOTAL, CKMB, CKMBINDEX, TROPONINI in the last 168 hours.  HbA1C: Hgb A1c MFr Bld  Date/Time Value Ref Range Status  03/21/2022 01:59 AM 5.2 4.8 - 5.6 % Final    Comment:    (NOTE) Pre diabetes:          5.7%-6.4%  Diabetes:              >6.4%  Glycemic control for   <7.0% adults with diabetes     CBG: No results for input(s): GLUCAP in the last 168 hours. Allergies Allergies  Allergen Reactions   Demerol [Meperidine] Nausea And Vomiting   Sulfa Antibiotics Nausea And Vomiting     Home Medications  Prior to Admission medications   Medication Sig Start Date End Date Taking? Authorizing Provider  ascorbic acid (VITAMIN C) 500 MG tablet Take 500 mg by mouth daily.   Yes [provider]  b complex vitamins capsule Take 1 capsule by mouth daily.   Yes [provider]  cetirizine (ZYRTEC) 10 MG tablet Take 10 mg by mouth 2 (two) times daily.   Yes [provider]  gabapentin  (NEURONTIN ) 300 MG capsule Take 1 capsule (300 mg total) by mouth at bedtime. 05/28/23  Yes Thedora Garnette HERO, MD  latanoprost   (XALATAN ) 0.005 % ophthalmic solution Place 1 drop into both eyes every evening. 02/14/22  Yes [provider]  Lifitegrast  (XIIDRA ) 5 % SOLN Place 1 drop into both eyes 2 (two) times daily.   Yes [provider]  Lutein  10 MG TABS Take 10 mg by mouth daily.   Yes [provider]  Multiple Vitamins-Minerals (PRESERVISION AREDS 2 PO) Take 1 tablet by mouth in the morning and at bedtime.   Yes [provider]  Omega-3 1000 MG CAPS Take 1 capsule by mouth daily.   Yes [provider]  azelastine  (ASTELIN ) 0.1 % nasal spray Place 1 spray into both nostrils 2 (two) times daily. Use in each nostril as directed Patient not taking: Reported on 06/11/2024 12/20/23   Thedora Garnette HERO, MD  triamcinolone (NASACORT ALLERGY 24HR) 55 MCG/ACT AERO nasal inhaler Place 2 sprays into the nose daily. Patient not taking: Reported on 06/11/2024    [provider]     Critical care time:     CRITICAL CARE Performed by: Lyle Pesa   Total critical care time: 37 minutes  Critical care time was exclusive of separately billable procedures and treating other patients.  Critical care was necessary to treat or prevent imminent or life-threatening deterioration.  Critical care was time spent personally by me on the following activities: development of treatment plan with patient and/or surrogate as well as nursing, discussions with consultants, evaluation of patient's response to treatment, examination of patient, obtaining history from patient or surrogate, ordering and performing treatments and interventions, ordering and review of laboratory studies, ordering and review of radiographic studies, pulse oximetry and re-evaluation of patient's condition.    Lyle Pesa, NP [facility] 06/12/2024, 8:28 AM  See Amion for pager If no response to pager , please call [phone] until 7pm After 7:00 pm call Elink   [phone]

## 2024-06-12 NOTE — Plan of Care (Signed)
  Problem: Clinical Measurements: Goal: Ability to maintain clinical measurements within normal limits will improve Outcome: Progressing Goal: Respiratory complications will improve Outcome: Progressing   Problem: Activity: Goal: Risk for activity intolerance will decrease Outcome: Progressing   Problem: Education: Goal: Knowledge of General Education information will improve Description: Including pain rating scale, medication(s)/side effects and non-pharmacologic comfort measures Outcome: Not Progressing

## 2024-06-12 NOTE — Evaluation (Signed)
 Clinical/Bedside Swallow Evaluation Patient Details  Name: Anna [name] MRN: [id number] Date of Birth: 11/10/1933  Today's Date: 06/12/2024 Time: SLP Start Time (ACUTE ONLY): 1550 SLP Stop Time (ACUTE ONLY): 1623 SLP Time Calculation (min) (ACUTE ONLY): 33 min  Past Medical History:  Past Medical History:  Diagnosis Date   Allergy    seasonal also with dogs and cats   Arthritis    Asthma    hx of in childhood    Bladder infection    hx of    Cancer (HCC)    breast cancer bilat has had double mastectomy    Chronic kidney disease    hx of kidney stones last one pasted 1 month ago    COVID    Fibrocystic breast disease    H/O blood clots    during delivery occurred 50 years ago per left arm    PONV (postoperative nausea and vomiting)    pt states stays very sleepy for days following anesthesia    Past Surgical History:  Past Surgical History:  Procedure Laterality Date   ABDOMINAL HYSTERECTOMY     APPENDECTOMY     BACK SURGERY     disc repaired 20 years ago    BREAST SURGERY     double mastectomy    CESAREAN SECTION     times 2   colonscopy      cyst repaired      following mammogram / cyst ruptured had to surgically repair area / left side    CYSTOSCOPY WITH RETROGRADE PYELOGRAM, URETEROSCOPY AND STENT PLACEMENT Right 06/11/2024   Procedure: CYSTOURETEROSCOPY, WITH RETROGRADE PYELOGRAM AND STENT INSERTION;  Surgeon: Roseann Adine PARAS., MD;  Location: [facility];  Service: Urology;  Laterality: Right;   DILATION AND CURETTAGE OF UTERUS     EYE SURGERY     bilat cataract surgery    HAND SURGERY     left hand / joint issues with thumb    LUNG SURGERY     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 S/P ureteral stent placement 10/16 with post-op elevated troponins felt due to demand ischemia.  Incr dyspnea t/o day, concern for lingual/lip swelling - s/o solumderol.  CXR B ATX and chronic interstitial changes. Swallow eval ordered.  Prior note from OV indicates pt reports sensing a specific medicine sticking in her throat despite drinking liquids.  Remains on HHFNC 50% -40L, sats 93-96- now on 6 or 7 Liters, still tachypneic per CCM NP note.  Neck MRI showed . Straightening  reversal of the normal cervical lordosis is present.  Aquired fusion of C3-C4.    Assessment / Plan / Recommendation  Clinical Impression  Patient greeted with multiple family members in her room. She is now on 7 liters oxygen.  Very xerostomic with skin peeling bilabial and retained viscous oral secretions - ? retained dime sized bloodish secretions on palate.  She was able to feed herself during the session.  RR and oxygen saturation remained stable overall - and swallow/respiration reciprocity was adequate.  Pt is coughing, concerning  for airway infiltration, of thin water with approx 75% of thin water boluses. Tsps of thin water and carbonated beverages were tolerated without s/s of aspiration.     Suspect pt have have some pharyngeal desensitization due to HFNC, and/or impaired timing of swallow, cog changes that may impact her acutely.  Recommend continue diet but encourage pt to drink sodas with meals and thin water between meals.    Remote h/o report of pill dysphagia per chart review - thus medicine with icecream may be easier tolerated. Discussed with family options for aggressive testing (MBS) or continue diet with close monitoring.  Daughter, Anna [name], and pt advised they would prefer the later.    Extensively educated pt and family to precautions.  Will follow up acutely for tolerance,  indication for instrumental swallow evaluation and family education. Messaged RN, NP for CCM who agrees with plan. SLP Visit Diagnosis: Dysphagia, unspecified (R13.10)    Aspiration Risk  Mild aspiration risk    Diet Recommendation Regular;Thin liquid (soda with meals; thin water between, softer foods preferred)    Supervision: Comment (start intake with liquids) Compensations: Slow rate;Small sips/bites Postural Changes: Remain upright for at least 30 minutes after po intake;Seated upright at 90 degrees    Other  Recommendations Oral Care Recommendations: Oral care BID Caregiver Recommendations: Have oral suction available     Assistance Recommended at Discharge    Functional Status Assessment Patient has had a recent decline in their functional status and demonstrates the ability to make significant improvements in function in a reasonable and predictable amount of time.  Frequency and Duration min 1 x/week  1 week       Prognosis Prognosis for improved oropharyngeal function: Good      Swallow Study   General Date of Onset: 06/11/24 HPI: 89 yo adm with flank pain - PMH + for Asthma, recent covid,kyphosis-  Found to have kidney stone - severe sepsis, hypotension, lactic acidosis - improved with IV fluids, ABX. S/P ureteral stent placement 10/16 with post-op elevated troponins felt due to demand ischemia.  Incr dyspnea t/o day, concern for lingual/lip swelling - s/o solumderol.  CXR B ATX and chronic interstitial changes. Swallow eval ordered.  Prior note from OV indicates pt reports sensing a specific medicine sticking in her throat despite drinking liquids.  Remains on HHFNC 50% -40L, sats 93-96- now on 6 or 7 Liters, still tachypneic per CCM NP note.  Neck MRI showed . Straightening  reversal of the normal cervical lordosis is present.  Aquired fusion of C3-C4. Previous Swallow Assessment: none in epic Diet Prior to this Study: Regular;Thin liquids (Level 0) Respiratory Status:  Room air History of Recent Intubation: No Behavior/Cognition: Alert;Cooperative;Pleasant mood Oral Care Completed by SLP: No Oral Cavity - Dentition: Adequate natural dentition Vision: Functional for self-feeding Baseline Vocal Quality: Normal Volitional Swallow: Able to elicit    Oral/Motor/Sensory Function Overall Oral Motor/Sensory Function: Within functional limits   Ice Chips Ice chips: Within functional limits Presentation: Spoon   Thin Liquid Thin Liquid: Impaired Presentation: Cup;Straw Pharyngeal  Phase Impairments: Cough - Immediate;Cough - Delayed Other Comments: cough with approx 75% of boluses of water via cup and straw, no indication of concern for airway with soda via cup nor straw - nor tsps of thin water, subtle throat clearing reported to be baseline    Nectar Thick Nectar Thick Liquid: Not tested   Honey Thick Honey Thick Liquid: Not tested   Puree Puree: Within functional limits Presentation: Self Fed;Spoon  Solid     Presentation: Self Fed Other Comments: slightly prolonged mastication with solids - and oral retention      Anna [name] 06/12/2024,5:37 PM  Anna POUR, MS [facility] SLP Acute Rehab Services Office [phone]

## 2024-06-12 NOTE — Progress Notes (Signed)
 Progress Note  Patient Name: Anna [name] Date of Encounter: 06/12/2024 Primary Cardiologist: Stanly DELENA Leavens, MD   Subjective   Overnight tachycardia and tachypnea persists.  Seen by PCCM. Patient notes back pain.  Much more theragic.  Vital Signs    Vitals:   06/12/24 0300 06/12/24 0400 06/12/24 0500 06/12/24 0600  BP: 137/64 96/73 (!) 124/56 (!) 113/95  Pulse: (!) 117  (!) 102 (!) 115  Resp: 19 (!) 30 (!) 22 (!) 32  Temp:  98.1 F (36.7 C)    TempSrc:  Axillary    SpO2: 93%  95% 96%  Weight:      Height:        Intake/Output Summary (Last 24 hours) at 06/12/2024 0757 Last data filed at 06/12/2024 0407 Gross per 24 hour  Intake 2046.31 ml  Output 900 ml  Net 1146.31 ml   Filed Weights   06/10/24 2337  Weight: 58.4 kg    Physical Exam   GEN: Critically ill, markedly warm Cardiac: Regular tachycardia , no murmurs, rubs, or gallops.  Respiratory: Basial crackles with decrease breath sounds bilaterally. GI: Soft, nontender, non-distended  MS: No edema   Labs   Telemetry: [facility] with PVCs   Chemistry Recent Labs  Lab 06/10/24 1743 06/11/24 0314 06/11/24 1821 06/12/24 0309  NA 142 143 141 142  K 3.8 3.6 4.1 4.4  CL 105 111 110 110  CO2 22 18* 18* 20*  GLUCOSE 133* 117* 101* 102*  BUN 16 15 20 23   CREATININE 0.88 1.21* 0.91 1.00  CALCIUM 9.7 8.2* 8.4* 8.7*  PROT 6.5 4.3*  --   --   ALBUMIN 3.9 2.6*  --   --   AST 31 61*  --   --   ALT 18 26  --   --   ALKPHOS 110 109  --   --   BILITOT 0.8 0.6  --   --   GFRNONAA >60 43* >60 54*  ANIONGAP 16* 13 13 12      Hematology Recent Labs  Lab 06/10/24 1743 06/11/24 0314 06/12/24 0309  WBC 3.5* 14.3* 34.0*  RBC 4.69 3.65* 4.12  HGB 13.9 10.6* 12.0  HCT 43.1 35.6* 38.3  MCV 91.9 97.5 93.0  MCH 29.6 29.0 29.1  MCHC 32.3 29.8* 31.3  RDW 13.8 14.3 14.9  PLT 180 72* 76*    Cardiac Studies   Cardiac Studies & Procedures    ______________________________________________________________________________________________     ECHOCARDIOGRAM  ECHOCARDIOGRAM COMPLETE 06/11/2024  Narrative ECHOCARDIOGRAM REPORT    Patient Name:   Anna [name] Date of Exam: 06/11/2024 Medical Rec #:  [id number]        Height:       59.0 in Accession #:    [id number]       Weight:       128.7 lb Date of Birth:  11-24-1933       BSA:          1.529 m Patient Age:    89 years         BP:           117/43 mmHg Patient Gender: F                HR:           95 bpm. Exam Location:  Inpatient  Procedure: 2D Echo, Cardiac Doppler and Color Doppler (Both Spectral and Color Flow Doppler were utilized during procedure).  STAT ECHO  Indications:  CHF I50.9  History:        Patient has prior history of Echocardiogram examinations, most recent 03/21/2022. CHF; Signs/Symptoms:Shortness of Breath. H/O Breast cancer.  Sonographer:    BERNARDA ROCKS Referring Phys: [id number] Othell Jaime A Elesha Thedford  IMPRESSIONS   1. Left ventricular ejection fraction, by estimation, is 60 to 65%. The left ventricle has normal function. The left ventricle has no regional wall motion abnormalities. Left ventricular diastolic parameters are consistent with Grade I diastolic dysfunction (impaired relaxation). Elevated left ventricular end-diastolic pressure. 2. Right ventricular systolic function is normal. The right ventricular size is normal. There is moderately elevated pulmonary artery systolic pressure. 3. Left atrial size was mildly dilated. 4. The mitral valve is degenerative. Mild mitral valve regurgitation. Mild to moderate mitral stenosis. The mean mitral valve gradient is 6.0 mmHg. 5. The aortic valve is normal in structure. Aortic valve regurgitation is mild. No aortic stenosis is present. 6. The inferior vena cava is dilated in size with <50% respiratory variability, suggesting right atrial pressure of 15 mmHg.  FINDINGS Left Ventricle:  Left ventricular ejection fraction, by estimation, is 60 to 65%. The left ventricle has normal function. The left ventricle has no regional wall motion abnormalities. The left ventricular internal cavity size was normal in size. There is no left ventricular hypertrophy. Left ventricular diastolic parameters are consistent with Grade I diastolic dysfunction (impaired relaxation). Elevated left ventricular end-diastolic pressure.  Right Ventricle: The right ventricular size is normal. No increase in right ventricular wall thickness. Right ventricular systolic function is normal. There is moderately elevated pulmonary artery systolic pressure. The tricuspid regurgitant velocity is 3.12 m/s, and with an assumed right atrial pressure of 15 mmHg, the estimated right ventricular systolic pressure is 53.9 mmHg.  Left Atrium: Left atrial size was mildly dilated.  Right Atrium: Right atrial size was normal in size.  Pericardium: Trivial pericardial effusion is present. The pericardial effusion is circumferential.  Mitral Valve: The mitral valve is degenerative in appearance. Mild to moderate mitral annular calcification. Mild mitral valve regurgitation. Mild to moderate mitral valve stenosis. MV peak gradient, 13.5 mmHg. The mean mitral valve gradient is 6.0 mmHg.  Tricuspid Valve: The tricuspid valve is normal in structure. Tricuspid valve regurgitation is mild . No evidence of tricuspid stenosis.  Aortic Valve: The aortic valve is normal in structure. Aortic valve regurgitation is mild. Aortic regurgitation PHT measures 534 msec. No aortic stenosis is present. Aortic valve mean gradient measures 3.0 mmHg. Aortic valve peak gradient measures 6.6 mmHg. Aortic valve area, by VTI measures 3.01 cm.  Pulmonic Valve: The pulmonic valve was not well visualized. Pulmonic valve regurgitation is trivial. No evidence of pulmonic stenosis.  Aorta: The aortic root is normal in size and structure.  Venous: The  inferior vena cava is dilated in size with less than 50% respiratory variability, suggesting right atrial pressure of 15 mmHg.  IAS/Shunts: No atrial level shunt detected by color flow Doppler.   LEFT VENTRICLE PLAX 2D LVIDd:         4.60 cm      Diastology LVIDs:         3.10 cm      LV e' medial:    3.70 cm/s LV PW:         0.90 cm      LV E/e' medial:  37.3 LV IVS:        0.90 cm      LV e' lateral:   6.31 cm/s LVOT diam:  1.90 cm      LV E/e' lateral: 21.9 LV SV:         73 LV SV Index:   48 LVOT Area:     2.84 cm  LV Volumes (MOD) LV vol d, MOD A2C: 104.0 ml LV vol d, MOD A4C: 101.0 ml LV vol s, MOD A2C: 38.5 ml LV vol s, MOD A4C: 39.7 ml LV SV MOD A2C:     65.5 ml LV SV MOD A4C:     101.0 ml LV SV MOD BP:      63.7 ml  RIGHT VENTRICLE             IVC RV Basal diam:  3.40 cm     IVC diam: 2.40 cm RV S prime:     11.90 cm/s TAPSE (M-mode): 2.3 cm RVSP:           53.9 mmHg  LEFT ATRIUM             Index        RIGHT ATRIUM            Index LA diam:        3.60 cm 2.35 cm/m   RA Pressure: 15.00 mmHg LA Vol (A2C):   58.6 ml 38.31 ml/m  RA Area:     10.60 cm LA Vol (A4C):   46.1 ml 30.14 ml/m  RA Volume:   20.10 ml   13.14 ml/m LA Biplane Vol: 52.0 ml 34.00 ml/m AORTIC VALVE                    PULMONIC VALVE AV Area (Vmax):    2.50 cm     PV Vmax:          0.91 m/s AV Area (Vmean):   2.69 cm     PV Peak grad:     3.3 mmHg AV Area (VTI):     3.01 cm     PR End Diast Vel: 4.73 msec AV Vmax:           128.00 cm/s AV Vmean:          78.500 cm/s AV VTI:            0.242 m AV Peak Grad:      6.6 mmHg AV Mean Grad:      3.0 mmHg LVOT Vmax:         113.00 cm/s LVOT Vmean:        74.500 cm/s LVOT VTI:          0.257 m LVOT/AV VTI ratio: 1.06 AI PHT:            534 msec  AORTA Ao Root diam: 3.00 cm Ao Asc diam:  3.20 cm  MITRAL VALVE                TRICUSPID VALVE MV Area (PHT): 5.84 cm     TR Peak grad:   38.9 mmHg MV Area VTI:   2.21 cm     TR Vmax:         312.00 cm/s MV Peak grad:  13.5 mmHg    Estimated RAP:  15.00 mmHg MV Mean grad:  6.0 mmHg     RVSP:           53.9 mmHg MV Vmax:       1.84 m/s MV Vmean:      120.5 cm/s   SHUNTS MV Decel Time: 130 msec  Systemic VTI:  0.26 m MR Peak grad: 77.4 mmHg     Systemic Diam: 1.90 cm MR Vmax:      440.00 cm/s MV E velocity: 138.00 cm/s MV A velocity: 159.00 cm/s MV E/A ratio:  0.87  Kardie Tobb DO Electronically signed by Dub Huntsman DO Signature Date/Time: 06/11/2024/4:37:58 PM    Final          ______________________________________________________________________________________________           Assessment & Plan   Bacteremia and Septic shock Obstructive kidney stones with severe hydronephrosis - Lactate is improving with fluid resuscitation, now co-managed by [facility] and PCCM - IF TEE is needed in the future we will wait until swallowing mechanics respiratory status improved unless clinical status changes (currently no evidence of acute valve issues on 10/16 stat TEE) - Family notes that she is not DNI but CODE status is listed as DNR;  She is on HFNC; Tachycardia is related to her septic shock and is co-managed by [facility] and PCCM - if she needs IV diuretics to keep from worsening hypoxic respiratory failure trial spot dose 40 IV lasix X 1 (has been unable to take midodrine; patient due to AMS and swallowing issues) - has mild AI and PVCs; no evidence of cardiogenic shock component   NSTEMI - in the setting of septic shock, no rWMA; no plans for ischemic work up at this time   Acute kidney injury on chronic kidney disease stage 3B - resolved with IVF   WBC increased; increased to 34 Anemia- Hgb Stable Thrombocytopenia- stable   Protein-calorie malnutrition - complicated by confusion, pending SLP   GOC - patient is not AOX4 on my exam; discussed with family at length; her husband died from aspiration PNA related to Parkinson's Disease; they are worried something  similar could occur  Cardiology will peripherally follow over the weekend; please call with new cardiac concerns.  CRITICAL CARE Performed by: Letoya Stallone A Nelwyn Hebdon  Total critical care time: 32 minutes. Critical care time was exclusive of separately billable procedures and treating other patients. Critical care was necessary to treat or prevent imminent or life-threatening deterioration. Critical care was time spent personally by me on the following activities: development of treatment plan with patient and her children as well as nursing, discussions with consultants, evaluation of patient's response to treatment, examination of patient, obtaining history from patient or surrogate,  and review of radiographic studies, pulse oximetry and re-evaluation of patient's condition'.    Signed, Stanly Leavens, MD FASE [facility] [place]  [facility]  06/12/2024 9:12 AM     For questions or updates, please contact [facility] Please consult [email or web address] for contact info under Cardiology/STEMI.      Stanly Leavens, MD FASE [facility] Cardiologist [facility]  [address] [place], [address] [place], [place] [address] [phone]  7:57 AM

## 2024-06-12 NOTE — Progress Notes (Signed)
   06/12/24 1409  Oxygen Therapy/Pulse Ox  SpO2 94 %  O2 Device HFNC  O2 Therapy Oxygen humidified  O2 Flow Rate (L/min) (S)  7 L/min (Changed to a 7 L [place], placed the HHFNC on standby.)

## 2024-06-12 NOTE — Evaluation (Signed)
 Physical Therapy Evaluation Patient Details Name: Anna [name] MRN: [id number] DOB: 1933-11-07 Today's Date: 06/12/2024  History of Present Illness  Patient is an 89 y/o female admitted 06/10/24 with dysuria, found to have R obstructive kidney stone with severe hydronephrosis positive for SIRS.  Patient underwent R retrograde pyelogram with insertion of R ureteral stent on 06/11/24.  Developed hypotension post procedure given IV bolus, albumin and midodrine.  Also was SOB and required HHFNC overnight, now on 7L vis [place]ter cannula O2.  PMH positive for obstructive kidney stones, asthma, breast CA, allergies, CKD stage III, osteoarthritis, and recent covid infection 4 weeks ago.  Clinical Impression  Patient presents with decreased mobility due to limited activity tolerance, decreased balance, decreased strength and decreased cognition.  Previously managing at home with intermittent support from son who works.  She used a cane intermittently.  Today needing up to mod A for mobility in the room and into hallway with RW and +2 for lines and initially support for improved stepping and anterior weight shift.  She will benefit from skilled PT in the acute setting and from post-acute inpatient rehab (<3 hours/day) prior to home.      If plan is discharge home, recommend the following: A little help with walking and/or transfers;A little help with bathing/dressing/bathroom;Assistance with cooking/housework;Help with stairs or ramp for entrance;Supervision due to cognitive status   Can travel by private vehicle   Yes    Equipment Recommendations None recommended by PT  Recommendations for Other Services       Functional Status Assessment Patient has had a recent decline in their functional status and demonstrates the ability to make significant improvements in function in a reasonable and predictable amount of time.     Precautions / Restrictions Precautions Precautions: Fall Recall of  Precautions/Restrictions: Impaired Precaution/Restrictions Comments: waist restraint      Mobility  Bed Mobility Overal bed mobility: Needs Assistance Bed Mobility: Supine to Sit, Sit to Supine     Supine to sit: Mod assist Sit to supine: Mod assist   General bed mobility comments: help to lift trunk and scoot to EOB, assist for legs to supine    Transfers Overall transfer level: Needs assistance Equipment used: Rolling walker (2 wheels) Transfers: Sit to/from Stand Sit to Stand: Min assist, +2 safety/equipment           General transfer comment: eager to stand assist for safety and +2 help for lines    Ambulation/Gait Ambulation/Gait assistance: Mod assist, +2 safety/equipment Gait Distance (Feet): 60 Feet Assistive device: Rolling walker (2 wheels) Gait Pattern/deviations: Step-through pattern, Step-to pattern, Decreased stride length, Shuffle, Trunk flexed       General Gait Details: increased time assist for anterior weight shift, initially shuffling and little movement forward, improved with time and son assisted with O2 tank  Stairs            Wheelchair Mobility     Tilt Bed    Modified Rankin (Stroke Patients Only)       Balance Overall balance assessment: Needs assistance   Sitting balance-Leahy Scale: Fair     Standing balance support: Bilateral upper extremity supported Standing balance-Leahy Scale: Poor                               Pertinent Vitals/Pain Pain Assessment Pain Assessment: No/denies pain    Home Living Family/patient expects to be discharged to:: Private residence Living Arrangements: Children (Son  who works) Available Help at Discharge: Family;Available PRN/intermittently Type of Home: House Home Access: Stairs to enter   Entrance Stairs-Number of Steps: 1   Home Layout: One level Home Equipment: Wheelchair - manual;Shower seat - built in;Grab bars - tub/shower;Rolling Environmental consultant (2 wheels);Cane -  single point      Prior Function Prior Level of Function : Independent/Modified Independent             Mobility Comments:  (Has been using a cane for the past few weeks.) ADLs Comments: Independent with ADLs, cooking, and cleaning. Does not drive.     Extremity/Trunk Assessment   Upper Extremity Assessment Upper Extremity Assessment: Defer to OT evaluation    Lower Extremity Assessment Lower Extremity Assessment: Generalized weakness    Cervical / Trunk Assessment Cervical / Trunk Assessment: Kyphotic  Communication   Communication Communication: No apparent difficulties    Cognition Arousal: Alert Behavior During Therapy: Restless   PT - Cognitive impairments: Orientation, Attention, Memory, Problem solving   Orientation impairments: Place, Time, Situation                   PT - Cognition Comments: needing reminders she is in the hospital, kept stating at home, trying to get up despite redirection at times Following commands: Impaired Following commands impaired: Follows one step commands with increased time     Cueing Cueing Techniques: Verbal cues     General Comments General comments (skin integrity, edema, etc.): VSS on 8L O2 with ambulation, family present and supportive    Exercises     Assessment/Plan    PT Assessment Patient needs continued PT services  PT Problem List Decreased strength;Decreased cognition;Decreased activity tolerance;Decreased balance;Decreased mobility;Pain;Decreased safety awareness;Decreased knowledge of use of DME       PT Treatment Interventions DME instruction;Gait training;Cognitive remediation;Patient/family education;Functional mobility training;Therapeutic activities;Therapeutic exercise;Balance training    PT Goals (Current goals can be found in the Care Plan section)  Acute Rehab PT Goals Patient Stated Goal: return to independent PT Goal Formulation: With family Time For Goal Achievement:  06/26/24 Potential to Achieve Goals: Fair    Frequency Min 2X/week     Co-evaluation               AM-PAC PT 6 Clicks Mobility  Outcome Measure Help needed turning from your back to your side while in a flat bed without using bedrails?: A Lot Help needed moving from lying on your back to sitting on the side of a flat bed without using bedrails?: A Lot Help needed moving to and from a bed to a chair (including a wheelchair)?: A Lot Help needed standing up from a chair using your arms (e.g., wheelchair or bedside chair)?: A Little Help needed to walk in hospital room?: A Lot Help needed climbing 3-5 steps with a railing? : Total 6 Click Score: 12    End of Session Equipment Utilized During Treatment: Gait belt;Oxygen Activity Tolerance: Patient tolerated treatment well Patient left: in bed;with call bell/phone within reach;with family/visitor present;with restraints reapplied   PT Visit Diagnosis: Other abnormalities of gait and mobility (R26.89);Muscle weakness (generalized) (M62.81);Other symptoms and signs involving the nervous system (R29.898)    Time: 8549-8484 PT Time Calculation (min) (ACUTE ONLY): 25 min   Charges:   PT Evaluation $PT Eval Moderate Complexity: 1 Mod   PT General Charges $$ ACUTE PT VISIT: 1 Visit         Micheline Portal, PT Acute Rehabilitation Services Office:[phone] 06/12/2024   [name] Portal  06/12/2024, 5:47 PM

## 2024-06-12 NOTE — Evaluation (Signed)
 Occupational Therapy Evaluation Patient Details Name: Anna [name] MRN: [id number] DOB: 04/23/1934 Today's Date: 06/12/2024   History of Present Illness   Patient is an 89 yr old female admitted 06/10/24 with dysuria, found to have R obstructive kidney stone with severe hydronephrosis positive for SIRS.  Patient underwent R retrograde pyelogram with insertion of R ureteral stent on 06/11/24.  Developed hypotension post procedure given IV bolus, albumin and midodrine.  Also was SOB and required HHFNC overnight, now on 7L vis [place] cannula O2.  PMH positive for obstructive kidney stones, asthma, breast CA, allergies, CKD stage III, osteoarthritis, and recent covid infection 4 weeks ago.     Clinical Impressions The pt is currently presenting well below her baseline level of functioning for self-care management. She is limited by the below listed deficits (see OT problem list). During the session, she required mod assist for supine to sit, mod assist for simulated lower body dressing, and min assist x2 to stand using a RW. She was noted to be with increased confusion, disorientation, decreased insight into deficits, impaired sustained attention, and impaired safety awareness; per her son, her cognitive deficits are acute in nature. She currently requires increased assist for self-care management. Further OT services are warranted to maximize the pt's safety and independence with self care tasks. Patient will benefit from continued inpatient follow up therapy, <3 hours/day.      If plan is discharge home, recommend the following:   Supervision due to cognitive status;Direct supervision/assist for medications management;Direct supervision/assist for financial management;Assistance with cooking/housework;Assist for transportation;Help with stairs or ramp for entrance;A lot of help with bathing/dressing/bathroom     Functional Status Assessment   Patient has had a recent decline in their  functional status and demonstrates the ability to make significant improvements in function in a reasonable and predictable amount of time.     Equipment Recommendations   Other (comment) (defer to next level of care)     Recommendations for Other Services         Precautions/Restrictions   Precautions Precautions: Fall Precaution/Restrictions Comments: waist restraint Restrictions Weight Bearing Restrictions Per Provider Order: No     Mobility Bed Mobility Overal bed mobility: Needs Assistance Bed Mobility: Supine to Sit, Sit to Supine     Supine to sit: Mod assist Sit to supine: Mod assist   General bed mobility comments: help to lift trunk and scoot to EOB, assist for legs to supine    Transfers Overall transfer level: Needs assistance Equipment used: Rolling walker (2 wheels) Transfers: Sit to/from Stand Sit to Stand: Min assist, +2 safety/equipment           General transfer comment: eager to stand assist for safety and +2 help for lines      Balance     Sitting balance-Leahy Scale: Fair       Standing balance-Leahy Scale: Poor            ADL either performed or assessed with clinical judgement   ADL Overall ADL's : Needs assistance/impaired Eating/Feeding: Minimal assistance;Cueing for safety;Sitting Eating/Feeding Details (indicate cue type and reason): based on clinical judgement Grooming: Minimal assistance;Cueing for sequencing;Cueing for safety;Sitting   Upper Body Bathing: Minimal assistance;Sitting   Lower Body Bathing: Moderate assistance;Sitting/lateral leans;Cueing for safety;Cueing for sequencing   Upper Body Dressing : Minimal assistance;Sitting;Cueing for safety;Cueing for sequencing   Lower Body Dressing: Moderate assistance;Sitting/lateral leans;Cueing for sequencing;Cueing for safety   Toilet Transfer: Minimal assistance;Cueing for safety;Cueing for sequencing;Rolling walker (2 wheels);Ambulation  Toileting-  Clothing Manipulation and Hygiene: Moderate assistance;Cueing for safety;Cueing for sequencing;Sit to/from stand         General ADL Comments: pt's overall ADL performance is at least partly impacted by her acute confusion and disorientation, and other deficits relative to attention, insight, and problem solving     Vision Baseline Vision/History: 1 Wears glasses;6 Macular Degeneration              Pertinent Vitals/Pain Pain Assessment Pain Assessment: No/denies pain     Extremity/Trunk Assessment Upper Extremity Assessment Upper Extremity Assessment: Generalized weakness;RUE deficits/detail;LUE deficits/detail;Right hand dominant RUE Deficits / Details: AROM WFL LUE Deficits / Details: AROM WFL   Lower Extremity Assessment Lower Extremity Assessment: Generalized weakness   Cervical / Trunk Assessment Cervical / Trunk Assessment: Kyphotic   Communication Communication Communication: No apparent difficulties   Cognition Arousal: Alert Behavior During Therapy: Restless, Impulsive     Orientation impairments: Place, Situation, Time Awareness: Intellectual awareness impaired, Online awareness impaired Memory impairment (select all impairments): Declarative long-term memory, Working [employer] fast streamer, Short-term memory Attention impairment (select first level of impairment): Divided attention, Sustained attention Executive functioning impairment (select all impairments): Reasoning, Organization, Problem solving OT - Cognition Comments: acute confusion and disorientation, per pt's son          Following commands: Impaired Following commands impaired: Follows one step commands with increased time     Cueing  General Comments   Cueing Techniques: Verbal cues             Home Living Family/patient expects to be discharged to:: Private residence Living Arrangements: Children (Son who works) Available Help at Discharge: Family;Available PRN/intermittently Type of Home:  House Home Access: Stairs to enter Entrance Stairs-Number of Steps: 1   Home Layout: One level     Bathroom Shower/Tub: Walk-in shower         Home Equipment: Wheelchair - manual;Shower seat - built in;Grab bars - Chartered loss adjuster (2 wheels);Cane - single point          Prior Functioning/Environment Prior Level of Function : Independent/Modified Independent             Mobility Comments:  (Has been using a cane for the past few weeks.) ADLs Comments: Independent with ADLs, cooking, and cleaning. Does not drive.    OT Problem List: Decreased strength;Impaired balance (sitting and/or standing);Impaired vision/perception;Decreased safety awareness;Decreased cognition;Decreased knowledge of use of DME or AE;Decreased knowledge of precautions   OT Treatment/Interventions: Self-care/ADL training;Therapeutic exercise;Therapeutic activities;Cognitive remediation/compensation;Energy conservation;Patient/family education;DME and/or AE instruction;Balance training      OT Goals(Current goals can be found in the care plan section)   Acute Rehab OT Goals OT Goal Formulation: With patient/family Time For Goal Achievement: 06/26/24 Potential to Achieve Goals: Good ADL Goals Pt Will Perform Grooming: with supervision;standing Pt Will Perform Upper Body Dressing: with set-up;sitting Pt Will Perform Lower Body Dressing: with supervision;sitting/lateral leans;sit to/from stand Pt Will Transfer to Toilet: with supervision;ambulating Pt Will Perform Toileting - Clothing Manipulation and hygiene: with supervision;sit to/from stand   OT Frequency:  Min 2X/week    Co-evaluation PT/OT/SLP Co-Evaluation/Treatment: Yes Reason for Co-Treatment: To address functional/ADL transfers PT goals addressed during session: Balance;Mobility/safety with mobility OT goals addressed during session: ADL's and self-care;Proper use of Adaptive equipment and DME      AM-PAC OT 6 Clicks Daily  Activity     Outcome Measure Help from another person eating meals?: A Little Help from another person taking care of personal grooming?: A Little Help from another person  toileting, which includes using toliet, bedpan, or urinal?: A Lot Help from another person bathing (including washing, rinsing, drying)?: A Lot Help from another person to put on and taking off regular upper body clothing?: A Little Help from another person to put on and taking off regular lower body clothing?: A Lot 6 Click Score: 15   End of Session Equipment Utilized During Treatment: Gait belt;Rolling walker (2 wheels);Oxygen Nurse Communication: Mobility status  Activity Tolerance: Patient tolerated treatment well Patient left: in bed;with call bell/phone within reach;with bed alarm set;with family/visitor present  OT Visit Diagnosis: Unsteadiness on feet (R26.81);Muscle weakness (generalized) (M62.81);Other abnormalities of gait and mobility (R26.89);Other symptoms and signs involving cognitive function                Time: 1450-1515 OT Time Calculation (min): 25 min Charges:  OT General Charges $OT Visit: 1 Visit OT Evaluation $OT Eval Moderate Complexity: 1 Mod    Adib Wahba J Harris, OTR/L 06/12/2024, 5:56 PM

## 2024-06-13 DIAGNOSIS — A419 Sepsis, unspecified organism: Secondary | ICD-10-CM | POA: Diagnosis not present

## 2024-06-13 DIAGNOSIS — R6521 Severe sepsis with septic shock: Secondary | ICD-10-CM | POA: Diagnosis not present

## 2024-06-13 LAB — CULTURE, BLOOD (ROUTINE X 2): Special Requests: ADEQUATE

## 2024-06-13 LAB — DIFFERENTIAL
Abs Immature Granulocytes: 0.42 K/uL — ABNORMAL HIGH (ref 0.00–0.07)
Basophils Absolute: 0 K/uL (ref 0.0–0.1)
Basophils Relative: 0 %
Eosinophils Absolute: 0 K/uL (ref 0.0–0.5)
Eosinophils Relative: 0 %
Immature Granulocytes: 1 %
Lymphocytes Relative: 5 %
Lymphs Abs: 1.6 K/uL (ref 0.7–4.0)
Monocytes Absolute: 0.7 K/uL (ref 0.1–1.0)
Monocytes Relative: 2 %
Neutro Abs: 33.4 K/uL — ABNORMAL HIGH (ref 1.7–7.7)
Neutrophils Relative %: 92 %
Smear Review: NORMAL

## 2024-06-13 LAB — CBC
HCT: 36.3 % (ref 36.0–46.0)
Hemoglobin: 11.5 g/dL — ABNORMAL LOW (ref 12.0–15.0)
MCH: 29.3 pg (ref 26.0–34.0)
MCHC: 31.7 g/dL (ref 30.0–36.0)
MCV: 92.6 fL (ref 80.0–100.0)
Platelets: 78 K/uL — ABNORMAL LOW (ref 150–400)
RBC: 3.92 MIL/uL (ref 3.87–5.11)
RDW: 15.2 % (ref 11.5–15.5)
WBC: 35.8 K/uL — ABNORMAL HIGH (ref 4.0–10.5)
nRBC: 0 % (ref 0.0–0.2)

## 2024-06-13 LAB — BASIC METABOLIC PANEL WITH GFR
Anion gap: 9 (ref 5–15)
BUN: 33 mg/dL — ABNORMAL HIGH (ref 8–23)
CO2: 24 mmol/L (ref 22–32)
Calcium: 9 mg/dL (ref 8.9–10.3)
Chloride: 111 mmol/L (ref 98–111)
Creatinine, Ser: 0.78 mg/dL (ref 0.44–1.00)
GFR, Estimated: >60 mL/min (ref >60)
Glucose, Bld: 99 mg/dL (ref 70–99)
Potassium: 3.7 mmol/L (ref 3.5–5.1)
Sodium: 144 mmol/L (ref 135–145)

## 2024-06-13 LAB — URINE CULTURE: Culture: >=100000 — AB

## 2024-06-13 MED ORDER — ENOXAPARIN SODIUM 40 MG/0.4ML IJ SOSY
40.0000 mg | PREFILLED_SYRINGE | INTRAMUSCULAR | Status: DC
  Administered 2024-06-14: 40 mg via SUBCUTANEOUS
  Filled 2024-06-13 (×2): qty 0.4

## 2024-06-13 MED ORDER — MIDODRINE HCL 5 MG PO TABS
5.0000 mg | ORAL_TABLET | Freq: Three times a day (TID) | ORAL | Status: DC
Start: 2024-06-13 — End: 2024-06-14
  Filled 2024-06-13: qty 1

## 2024-06-13 NOTE — Progress Notes (Signed)
 Speech Language Pathology Treatment: Dysphagia  Patient Details Name: TERRA AVENI MRN: [id number] DOB: [birth year] Today's Date: 06/13/2024 Time: 9060-9052 SLP Time Calculation (min) (ACUTE ONLY): 8 min  Assessment / Plan / Recommendation Clinical Impression  Patient seen by SLP for skilled treatment focused on dysphagia goals. She was sitting up in recliner and daughter and son in law were both present in the room. Daughter indicated that patient has not had much of an appetite but is tolerating pureed/pudding consistency solids without difficulty. She still observes some coughing with thin liquids but following precautions from prior SLP session has helped reduce this (drinking carbonated beverages, keeping mouth clean and moist, taking medications with ice cream). Patient's main complaint at this time is her breathing, stating that it (chest) vibrates when she is breathing. She denies any pain with PO intake or with breathing. SLP assessed her toleration of thin liquids with straw sips of thin liquids (lemon lime soda). Swallow initiation appeared timely and no overt s/s aspiration observed during or after PO intake. SLP discussed with patient and family and all in agreement to continue regular solids, thin liquids, focus on softer solids and following swallow precautions. SLP to s/o at this time but encouraged patient and family to request reevaluation of swallow by SLP if concerns.   HPI HPI: 89 yo adm with flank pain - PMH + for Asthma, recent covid,kyphosis-  Found to have kidney stone - severe sepsis, hypotension, lactic acidosis - improved with IV fluids, ABX. S/P ureteral stent placement 10/16 with post-op elevated troponins felt due to demand ischemia.  Incr dyspnea t/o day, concern for lingual/lip swelling - s/o solumderol.  CXR B ATX and chronic interstitial changes. Swallow eval ordered.  Prior note from OV indicates pt reports sensing a specific medicine sticking in her throat  despite drinking liquids.  Remains on HHFNC 50% -40L, sats 93-96- now on 6 or 7 Liters, still tachypneic per CCM NP note.  Neck MRI showed . Straightening  reversal of the normal cervical lordosis is present.  Aquired fusion of C3-C4.      SLP Plan  Discharge SLP treatment due to (comment);All goals met          Recommendations  Diet recommendations: Regular;Thin liquid Liquids provided via: Cup;Straw Medication Administration: Other (Comment) (as tolerated) Supervision: Patient able to self feed;Intermittent supervision to cue for compensatory strategies Compensations: Slow rate;Small sips/bites Postural Changes and/or Swallow Maneuvers: Seated upright 90 degrees                  Oral care BID   Intermittent Supervision/Assistance Dysphagia, unspecified (R13.10)     Discharge SLP treatment due to (comment);All goals met    Norleen IVAR Blase, MA, CCC-SLP Speech Therapy

## 2024-06-13 NOTE — Progress Notes (Signed)
 NAME:  Anna [name], MRN:  [id number], DOB:  1934-01-25, LOS: 3 ADMISSION DATE:  06/10/2024, CONSULTATION DATE:  06/11/24 REFERRING MD:  Dr. Christobal, CHIEF COMPLAINT:  rising lactic   History of Present Illness:   26 yoF with PMH as below significant for asthma, CKD III, and obstructive kidney stones who presented 10/15 with right back pain, fever, confusion, fatigue, and nausea since Wednesday am presented found to have severe sepsis due to right obstructive ureteral calculus with severe hydronephrosis with initial lactic acidosis and hypotension which improved with IV fluids, abx, and midodrine.  Underwent cystoscopy with right ureteral stent placement 10/16 am with urology.  BC showing GNR with Klebseilla oxytoca.  Broad spectrum abx narrowed to ceftriaxone .  Cardiology consulted for elevated troponin felt secondary to demand ischemia.  Worsening SOB throughout the day but stable O2 requirements on 2L.  Lactic trending up, 6> 5.4> 5.1>5.7, and concern swelling around lips and tongue.  Solumedrol x 1 given.  CXR showing bibasilar atelectasis and chronic interstitial changes.  BP remains stable and afebrile throughout the day.  Pt currently denies any pain including CP, nausea, or obvious feeling of tongue or lip swelling.  Feels confused.  C/o of worsening SOB with tachypnea this afternoon and feels confused, can't get her words out.  Remains otherwise non focal neurologically.  Has felt more congestion, no cough, since having COVID 4 weeks ago and reports ongoing allergies.  SARS/ flu/ RSV was negative on admit.  PCCM consulted for further evaluation.   Pertinent  Medical History   Past Medical History:  Diagnosis Date   Allergy    seasonal also with dogs and cats   Arthritis    Asthma    hx of in childhood    Bladder infection    hx of    Cancer (HCC)    breast cancer bilat has had double mastectomy    Chronic kidney disease    hx of kidney stones last one pasted 1 month ago    COVID     Fibrocystic breast disease    H/O blood clots    during delivery occurred 50 years ago per left arm    PONV (postoperative nausea and vomiting)    pt states stays very sleepy for days following anesthesia    Significant Hospital Events: Including procedures, antibiotic start and stop dates in addition to other pertinent events   10/16 admitted, s/p ureteral stent, cards/ PCCM consult  Interim History / Subjective:   Somewhat improved sleep overnight, but still awake a majority of the night Having delirium Pulled out foley cathter overnight, it has been replaced Daughter/Son in law at bedside   Objective    Blood pressure (!) 169/55, pulse (!) 109, temperature 99.4 F (37.4 C), temperature source Axillary, resp. rate (!) 27, height 4' 11 (1.499 m), weight 58.4 kg, SpO2 97%.    FiO2 (%):  [55 %] 55 %   Intake/Output Summary (Last 24 hours) at 06/13/2024 0648 Last data filed at 06/13/2024 0358 Gross per 24 hour  Intake 112.03 ml  Output 1050 ml  Net -937.97 ml   Filed Weights   06/10/24 2337  Weight: 58.4 kg   Examination: General:  elderly female sitting upright in bed HEENT: [place]/AT, moist mucous membranes Neuro: Awake, alert moving all extrmities CV: Tacycardic PULM:  tachypneic but non labored/ no accessory muscle use, clear anteriorly, no wheeze Extremities: warm/dry, trace ankle edema  Skin: no rashes  UOP 1L/ 24hrs Net +4.7L WBC 35.8  Resolved problem list   Assessment and Plan   Severe sepsis secondary to right obstructive ureteral calculus s/p stent placement Klebsiella oxytoca bacteremia  Lactic acidosis/ NAGMA Elevated troponin with suspected demand ischemia Thrombocytopenia- likely related to severe sepsis +/- dilutional component  - trop 29> 120, EKG - 10/16 Echo: LVEF 60-65%, G1DD. RV normal size and function; moderately elevated PASP 53. Mildly elevated LA. Mild MR, mild to moderate MS. Mild A  P:  - cont ceftriaxone  - cardiology following.  No  plans for TEE at this time given bacteremia - urology following  Hypoxic respiratory failure- multifactorial in sepsis, atelectasis, and acidosis driving tachypnea, pain, possibly some fluid overload.   - Can't rule out underlying ILD process with mosaic pattern, evidence of gas trapping and subpleural scarring/ atelectasis seen on CT 10/15 and previous CT 02/2022 P:  - cont HHFNC to prevent resp fatigue - goal sat > 92% - cont aggressive IS, mobilize, pulm hygiene - duonebs schedule q6 hours - continue zyrtec/ astelin  - ideally needs output pulmonary followup - DNI  Septic encephalopathy - supportive care, delirium precautions, PT/ OT.  Remains non focal. Melatonin qHS.  Ideally avoid narcotics, tylenol  preferred - consider adding seroquel at bedtime  Remainder per TRH, PCCM will continue to follow.  Son in Social worker and daughter updated at bedside.   Labs   CBC: Recent Labs  Lab 06/10/24 1743 06/11/24 0314 06/12/24 0309 06/13/24 0250  WBC 3.5* 14.3* 34.0* 35.8*  NEUTROABS 3.2  --   --   --   HGB 13.9 10.6* 12.0 11.5*  HCT 43.1 35.6* 38.3 36.3  MCV 91.9 97.5 93.0 92.6  PLT 180 72* 76* 78*    Basic Metabolic Panel: Recent Labs  Lab 06/10/24 1743 06/11/24 0314 06/11/24 1821 06/12/24 0309 06/13/24 0250  NA 142 143 141 142 144  K 3.8 3.6 4.1 4.4 3.7  CL 105 111 110 110 111  CO2 22 18* 18* 20* 24  GLUCOSE 133* 117* 101* 102* 99  BUN 16 15 20 23  33*  CREATININE 0.88 1.21* 0.91 1.00 0.78  CALCIUM 9.7 8.2* 8.4* 8.7* 9.0   GFR: Estimated Creatinine Clearance: 38.6 mL/min (by C-G formula based on SCr of 0.78 mg/dL). Recent Labs  Lab 06/10/24 1743 06/10/24 1744 06/11/24 0314 06/11/24 0625 06/11/24 0754 06/11/24 1259 06/11/24 1821 06/12/24 0309 06/12/24 0607 06/13/24 0250  WBC 3.5*  --  14.3*  --   --   --   --  34.0*  --  35.8*  LATICACIDVEN  --    < > 6.0*   < > 5.1* 5.7* 3.8*  --  2.3*  --    < > = values in this interval not displayed.    Liver Function  Tests: Recent Labs  Lab 06/10/24 1743 06/11/24 0314  AST 31 61*  ALT 18 26  ALKPHOS 110 109  BILITOT 0.8 0.6  PROT 6.5 4.3*  ALBUMIN 3.9 2.6*   No results for input(s): LIPASE, AMYLASE in the last 168 hours. No results for input(s): AMMONIA in the last 168 hours.  ABG No results found for: PHART, PCO2ART, PO2ART, HCO3, TCO2, ACIDBASEDEF, O2SAT   Coagulation Profile: Recent Labs  Lab 06/10/24 1743 06/11/24 0314  INR 1.0 1.6*    Cardiac Enzymes: No results for input(s): CKTOTAL, CKMB, CKMBINDEX, TROPONINI in the last 168 hours.  HbA1C: Hgb A1c MFr Bld  Date/Time Value Ref Range Status  03/21/2022 01:59 AM 5.2 4.8 - 5.6 % Final    Comment:    (  NOTE) Pre diabetes:          5.7%-6.4%  Diabetes:              >6.4%  Glycemic control for   <7.0% adults with diabetes     CBG: No results for input(s): GLUCAP in the last 168 hours. Allergies Allergies  Allergen Reactions   Demerol [Meperidine] Nausea And Vomiting   Sulfa Antibiotics Nausea And Vomiting    Critical care time:     CRITICAL CARE Performed by: Dorn KATHEE Chill   Total critical care time: 32 minutes  Critical care time was exclusive of separately billable procedures and treating other patients.  Critical care was necessary to treat or prevent imminent or life-threatening deterioration.  Critical care was time spent personally by me on the following activities: development of treatment plan with patient and/or surrogate as well as nursing, discussions with consultants, evaluation of patient's response to treatment, examination of patient, obtaining history from patient or surrogate, ordering and performing treatments and interventions, ordering and review of laboratory studies, ordering and review of radiographic studies, pulse oximetry, re-evaluation of patient's condition and participation in multidisciplinary rounds.  Dorn Chill, MD  Pulmonary & Critical  Care Office: [phone]   See Amion for personal pager PCCM on call pager [phone] until 7pm. Please call Elink 7p-7a. [phone]

## 2024-06-13 NOTE — Progress Notes (Signed)
 Urology Inpatient Progress Report   Intv/Subj: Patient sitting in bedside chair with 3 family members present at bedside.  She inadvertently pulled her Foley catheter out overnight and it was replaced.  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   Urinary tract obstruction due to kidney stone   Right ureteral calculus   Acute hypoxic respiratory failure (HCC)   Klebsiella infection   Acute cystitis with hematuria  Current Facility-Administered Medications  Medication Dose Route Frequency Provider Last Rate Last Admin   acetaminophen  (TYLENOL ) tablet 650 mg  650 mg Oral Q6H PRN Stoneking, Adine PARAS., MD       artificial tears ophthalmic solution 1 drop  1 drop Both Eyes BID Kc, Ramesh, MD   1 drop at 06/12/24 2104   azelastine  (ASTELIN ) 0.1 % nasal spray 1 spray  1 spray Each Nare BID Christobal Guadalajara, MD   1 spray at 06/12/24 2104   cefTRIAXone  (ROCEPHIN ) 2 g in sodium chloride  0.9 % 100 mL IVPB  2 g Intravenous Q24H Kc, Guadalajara, MD   Stopped at 06/12/24 1022   Chlorhexidine  Gluconate Cloth 2 % PADS 6 each  6 each Topical Daily Roseann Adine PARAS., MD   6 each at 06/12/24 0923   enoxaparin  (LOVENOX ) injection 30 mg  30 mg Subcutaneous Q24H Ellington, Abby K, RPH   30 mg at 06/12/24 0948   ipratropium-albuterol (DUONEB) 0.5-2.5 (3) MG/3ML nebulizer solution 3 mL  3 mL Nebulization Q6H Antonetta Moccasin B, NP   3 mL at 06/13/24 0242   latanoprost  (XALATAN ) 0.005 % ophthalmic solution 1 drop  1 drop Both Eyes QPM Kc, Ramesh, MD   1 drop at 06/12/24 1819   loratadine  (CLARITIN ) tablet 10 mg  10 mg Oral Daily PRN Kc, Guadalajara, MD       melatonin tablet 5 mg  5 mg Oral QHS PRN Chavez, Abigail, NP   5 mg at 06/12/24 2112   midodrine (PROAMATINE) tablet 5 mg  5 mg Oral TID WC Dewald, Jonathan B, MD       ondansetron  (ZOFRAN ) tablet 4 mg  4 mg Oral Q6H PRN Stoneking, Adine PARAS., MD       Or   ondansetron   (ZOFRAN ) injection 4 mg  4 mg Intravenous Q6H PRN Stoneking, Adine PARAS., MD       Oral care mouth rinse  15 mL Mouth Rinse PRN Stoneking, Adine PARAS., MD       oxyCODONE  (Oxy IR/ROXICODONE ) immediate release tablet 5 mg  5 mg Oral Q6H PRN Roseann Adine PARAS., MD         Objective: Vital: Vitals:   06/13/24 0223 06/13/24 0242 06/13/24 0300 06/13/24 0545  BP: (!) 145/76  (!) 169/55   Pulse: (!) 116 (!) 110 (!) 109   Resp: (!) 27 (!) 23 (!) 27   Temp:    99.4 F (37.4 C)  TempSrc:    Axillary  SpO2: 92% 97% 97%   Weight:      Height:       I/Os: I/O last 3 completed shifts: In: 112 [IV Piggyback:112] Out: 1525 [Urine:1525]  Physical Exam:  General: Patient is in no apparent distress Lungs: Normal respiratory effort, chest expands symmetrically. Ext: lower extremities symmetric  Lab Results: Recent Labs    06/11/24 0314 06/12/24 0309 06/13/24 0250  WBC 14.3* 34.0* 35.8*  HGB 10.6* 12.0 11.5*  HCT 35.6* 38.3 36.3  Recent Labs    06/11/24 1821 06/12/24 0309 06/13/24 0250  NA 141 142 144  K 4.1 4.4 3.7  CL 110 110 111  CO2 18* 20* 24  GLUCOSE 101* 102* 99  BUN 20 23 33*  CREATININE 0.91 1.00 0.78  CALCIUM 8.4* 8.7* 9.0   Recent Labs    06/10/24 1743 06/11/24 0314  INR 1.0 1.6*   No results for input(s): LABURIN in the last 72 hours. Results for orders placed or performed during the hospital encounter of 06/10/24  Culture, blood (Routine x 2)     Status: Abnormal (Preliminary result)   Collection Time: 06/10/24  5:47 PM   Specimen: BLOOD LEFT ARM  Result Value Ref Range Status   Specimen Description   Final    BLOOD LEFT ARM Performed at [facility], [address] [place], [place] [address]    Special Requests   Final    BOTTLES DRAWN AEROBIC AND ANAEROBIC Blood Culture adequate volume Performed at [facility], [address] [place], [place] [address]    Culture  Setup Time   Final    GRAM NEGATIVE RODS IN BOTH  AEROBIC AND ANAEROBIC BOTTLES CRITICAL VALUE NOTED.  VALUE IS CONSISTENT WITH PREVIOUSLY REPORTED AND CALLED VALUE. Performed at [facility], [address], [place], [place] [address]    Culture KLEBSIELLA OXYTOCA (A)  Final   Report Status PENDING  Incomplete  Culture, blood (Routine x 2)     Status: Abnormal (Preliminary result)   Collection Time: 06/10/24  5:47 PM   Specimen: BLOOD RIGHT ARM  Result Value Ref Range Status   Specimen Description   Final    BLOOD RIGHT ARM Performed at [facility], [address] [place], [place] [address]    Special Requests   Final    BOTTLES DRAWN AEROBIC AND ANAEROBIC Blood Culture adequate volume Performed at [facility], [address] [place], [place] [address]    Culture  Setup Time   Final    GRAM NEGATIVE RODS IN BOTH AEROBIC AND ANAEROBIC BOTTLES CRITICAL RESULT CALLED TO, READ BACK BY AND VERIFIED WITH: PHARMD K WINDSOR [id number] AT 840 AM BY CM    Culture (A)  Final    KLEBSIELLA OXYTOCA SUSCEPTIBILITIES TO FOLLOW Performed at [facility], [address] [place], [place] [address]    Report Status PENDING  Incomplete  Blood Culture ID Panel (Reflexed)     Status: Abnormal   Collection Time: 06/10/24  5:47 PM  Result Value Ref Range Status   Enterococcus faecalis NOT DETECTED NOT DETECTED Final   Enterococcus Faecium NOT DETECTED NOT DETECTED Final   Listeria monocytogenes NOT DETECTED NOT DETECTED Final   Staphylococcus species NOT DETECTED NOT DETECTED Final   Staphylococcus aureus (BCID) NOT DETECTED NOT DETECTED Final   Staphylococcus epidermidis NOT DETECTED NOT DETECTED Final   Staphylococcus lugdunensis NOT DETECTED NOT DETECTED Final   Streptococcus species NOT DETECTED NOT DETECTED Final   Streptococcus agalactiae NOT DETECTED NOT DETECTED Final   Streptococcus pneumoniae NOT DETECTED NOT DETECTED Final   Streptococcus pyogenes NOT DETECTED NOT DETECTED Final    A.calcoaceticus-baumannii NOT DETECTED NOT DETECTED Final   Bacteroides fragilis NOT DETECTED NOT DETECTED Final   Enterobacterales DETECTED (A) NOT DETECTED Final    Comment: Enterobacterales represent a large order of gram negative bacteria, not a single organism. CRITICAL RESULT CALLED TO, READ BACK BY AND VERIFIED  WITH: PHARMD K WINDSOR [id number] AT 840 AM BY CM    Enterobacter cloacae complex NOT DETECTED NOT DETECTED Final   Escherichia coli NOT DETECTED NOT DETECTED Final   Klebsiella aerogenes NOT DETECTED NOT DETECTED Final   Klebsiella oxytoca DETECTED (A) NOT DETECTED Final    Comment: CRITICAL RESULT CALLED TO, READ BACK BY AND VERIFIED WITH: PHARMD K WINDSOR [id number] AT 840 AM BY CM    Klebsiella pneumoniae NOT DETECTED NOT DETECTED Final   Proteus species NOT DETECTED NOT DETECTED Final   Salmonella species NOT DETECTED NOT DETECTED Final   Serratia marcescens NOT DETECTED NOT DETECTED Final   Haemophilus influenzae NOT DETECTED NOT DETECTED Final   Neisseria meningitidis NOT DETECTED NOT DETECTED Final   Pseudomonas aeruginosa NOT DETECTED NOT DETECTED Final   Stenotrophomonas maltophilia NOT DETECTED NOT DETECTED Final   Candida albicans NOT DETECTED NOT DETECTED Final   Candida auris NOT DETECTED NOT DETECTED Final   Candida glabrata NOT DETECTED NOT DETECTED Final   Candida krusei NOT DETECTED NOT DETECTED Final   Candida parapsilosis NOT DETECTED NOT DETECTED Final   Candida tropicalis NOT DETECTED NOT DETECTED Final   Cryptococcus neoformans/gattii NOT DETECTED NOT DETECTED Final   CTX-M ESBL NOT DETECTED NOT DETECTED Final   Carbapenem resistance IMP NOT DETECTED NOT DETECTED Final   Carbapenem resistance KPC NOT DETECTED NOT DETECTED Final   Carbapenem resistance NDM NOT DETECTED NOT DETECTED Final   Carbapenem resist OXA 48 LIKE NOT DETECTED NOT DETECTED Final   Carbapenem resistance VIM NOT DETECTED NOT DETECTED Final    Comment: Performed at [facility], [address] [place], [place] [address]  Resp panel by RT-PCR (RSV, Flu A&B, Covid) Anterior Nasal Swab     Status: None   Collection Time: 06/10/24  6:21 PM   Specimen: Anterior Nasal Swab  Result Value Ref Range Status   SARS Coronavirus 2 by RT PCR NEGATIVE NEGATIVE Final    Comment: (NOTE) SARS-CoV-2 target nucleic acids are NOT DETECTED.  The SARS-CoV-2 RNA is generally detectable in upper respiratory specimens during the acute phase of infection. The lowest concentration of SARS-CoV-2 viral copies this assay can detect is 138 copies/mL. A negative result does not preclude SARS-Cov-2 infection and should not be used as the sole basis for treatment or other patient management decisions. A negative result may occur with  improper specimen collection/handling, submission of specimen other than nasopharyngeal swab, presence of viral mutation(s) within the areas targeted by this assay, and inadequate number of viral copies(<138 copies/mL). A negative result must be combined with clinical observations, patient history, and epidemiological information. The expected result is Negative.  Fact Sheet for Patients:  BloggerCourse.com  Fact Sheet for Healthcare Providers:  SeriousBroker.it  This test is no t yet approved or cleared by the [place]  FDA and  has been authorized for detection and/or diagnosis of SARS-CoV-2 by FDA under an Emergency Use Authorization (EUA). This EUA will remain  in effect (meaning this test can be used) for the duration of the COVID-19 declaration under Section 564(b)(1) of the Act, 21 U.S.C.section 360bbb-3(b)(1), unless the authorization is terminated  or revoked sooner.       Influenza A by PCR NEGATIVE NEGATIVE Final   Influenza B by PCR NEGATIVE NEGATIVE Final    Comment: (NOTE) The Xpert Xpress SARS-CoV-2/FLU/RSV plus assay is intended as an aid in the diagnosis of influenza from  Nasopharyngeal swab specimens and should not be used as a sole basis  for treatment. Nasal washings and aspirates are unacceptable for Xpert Xpress SARS-CoV-2/FLU/RSV testing.  Fact Sheet for Patients: BloggerCourse.com  Fact Sheet for Healthcare Providers: SeriousBroker.it  This test is not yet approved or cleared by the [place]  FDA and has been authorized for detection and/or diagnosis of SARS-CoV-2 by FDA under an Emergency Use Authorization (EUA). This EUA will remain in effect (meaning this test can be used) for the duration of the COVID-19 declaration under Section 564(b)(1) of the Act, 21 U.S.C. section 360bbb-3(b)(1), unless the authorization is terminated or revoked.     Resp Syncytial Virus by PCR NEGATIVE NEGATIVE Final    Comment: (NOTE) Fact Sheet for Patients: BloggerCourse.com  Fact Sheet for Healthcare Providers: SeriousBroker.it  This test is not yet approved or cleared by the [place]  FDA and has been authorized for detection and/or diagnosis of SARS-CoV-2 by FDA under an Emergency Use Authorization (EUA). This EUA will remain in effect (meaning this test can be used) for the duration of the COVID-19 declaration under Section 564(b)(1) of the Act, 21 U.S.C. section 360bbb-3(b)(1), unless the authorization is terminated or revoked.  Performed at [facility], [address], [place], [place] [address]   Urine Culture     Status: None   Collection Time: 06/10/24  8:31 PM   Specimen: Urine, Catheterized  Result Value Ref Range Status   Specimen Description   Final    URINE, CATHETERIZED Performed at [facility], [address] [place], [place] [address]    Special Requests   Final    NONE Performed at [facility], [address] [place], [place] [address]    Culture   Final    NO GROWTH Performed at [facility], [address], [place], [place] [address]    Report Status 06/11/2024 FINAL  Final  MRSA Next Gen by PCR, Nasal     Status: None   Collection Time: 06/11/24 12:11 AM   Specimen: Nasal Mucosa; Nasal Swab  Result Value Ref Range Status   MRSA by PCR Next Gen NOT DETECTED NOT DETECTED Final    Comment: (NOTE) The GeneXpert MRSA Assay (FDA approved for NASAL specimens only), is one component of a comprehensive MRSA colonization surveillance program. It is not intended to diagnose MRSA infection nor to guide or monitor treatment for MRSA infections. Test performance is not FDA approved in patients less than 80 years old. Performed at [facility], [address] [place], [place] [address]   Urine Culture     Status: Abnormal (Preliminary result)   Collection Time: 06/11/24  1:48 AM   Specimen: Urine, Cystoscope  Result Value Ref Range Status   Specimen Description   Final    URINE, RANDOM Performed at [facility], [address] [place], [place] [address]    Special Requests   Final    Urine cystoscope Performed at [facility], [address], [place], [place] [address]    Culture (A)  Final    >=100,000 COLONIES/mL KLEBSIELLA OXYTOCA SUSCEPTIBILITIES TO FOLLOW Performed at [facility], [address], [place], [place] [address]    Report Status PENDING  Incomplete    Studies/Results: ECHOCARDIOGRAM COMPLETE Result Date: 06/11/2024    ECHOCARDIOGRAM REPORT   Patient Name:   Anna [name] Date of Exam: 06/11/2024 Medical Rec #:  [id number]        Height:  59.0 in Accession #:    [id number]       Weight:       128.7 lb Date of Birth:  05-12-1934       BSA:          1.529 m Patient Age:    89 years         BP:           117/43 mmHg Patient Gender: F                HR:           95 bpm. Exam Location:  Inpatient Procedure: 2D Echo, Cardiac Doppler and Color Doppler (Both Spectral and Color             Flow Doppler were utilized during procedure). STAT ECHO Indications:    CHF I50.9  History:        Patient has prior history of Echocardiogram examinations, most                 recent 03/21/2022. CHF; Signs/Symptoms:Shortness of Breath. H/O                 Breast cancer.  Sonographer:    BERNARDA ROCKS Referring Phys: [id number] MAHESH A CHANDRASEKHAR IMPRESSIONS  1. Left ventricular ejection fraction, by estimation, is 60 to 65%. The left ventricle has normal function. The left ventricle has no regional wall motion abnormalities. Left ventricular diastolic parameters are consistent with Grade I diastolic dysfunction (impaired relaxation). Elevated left ventricular end-diastolic pressure.  2. Right ventricular systolic function is normal. The right ventricular size is normal. There is moderately elevated pulmonary artery systolic pressure.  3. Left atrial size was mildly dilated.  4. The mitral valve is degenerative. Mild mitral valve regurgitation. Mild to moderate mitral stenosis. The mean mitral valve gradient is 6.0 mmHg.  5. The aortic valve is normal in structure. Aortic valve regurgitation is mild. No aortic stenosis is present.  6. The inferior vena cava is dilated in size with <50% respiratory variability, suggesting right atrial pressure of 15 mmHg. FINDINGS  Left Ventricle: Left ventricular ejection fraction, by estimation, is 60 to 65%. The left ventricle has normal function. The left ventricle has no regional wall motion abnormalities. The left ventricular internal cavity size was normal in size. There is  no left ventricular hypertrophy. Left ventricular diastolic parameters are consistent with Grade I diastolic dysfunction (impaired relaxation). Elevated left ventricular end-diastolic pressure. Right Ventricle: The right ventricular size is normal. No increase in right ventricular wall thickness. Right ventricular systolic function is normal. There is moderately elevated pulmonary artery systolic  pressure. The tricuspid regurgitant velocity is 3.12 m/s, and with an assumed right atrial pressure of 15 mmHg, the estimated right ventricular systolic pressure is 53.9 mmHg. Left Atrium: Left atrial size was mildly dilated. Right Atrium: Right atrial size was normal in size. Pericardium: Trivial pericardial effusion is present. The pericardial effusion is circumferential. Mitral Valve: The mitral valve is degenerative in appearance. Mild to moderate mitral annular calcification. Mild mitral valve regurgitation. Mild to moderate mitral valve stenosis. MV peak gradient, 13.5 mmHg. The mean mitral valve gradient is 6.0 mmHg. Tricuspid Valve: The tricuspid valve is normal in structure. Tricuspid valve regurgitation is mild . No evidence of tricuspid stenosis. Aortic Valve: The aortic valve is normal in structure. Aortic valve regurgitation is mild. Aortic regurgitation PHT measures 534 msec. No aortic stenosis is present. Aortic valve mean gradient measures 3.0 mmHg. Aortic valve peak gradient  measures 6.6 mmHg. Aortic valve area, by VTI measures 3.01 cm. Pulmonic Valve: The pulmonic valve was not well visualized. Pulmonic valve regurgitation is trivial. No evidence of pulmonic stenosis. Aorta: The aortic root is normal in size and structure. Venous: The inferior vena cava is dilated in size with less than 50% respiratory variability, suggesting right atrial pressure of 15 mmHg. IAS/Shunts: No atrial level shunt detected by color flow Doppler.  LEFT VENTRICLE PLAX 2D LVIDd:         4.60 cm      Diastology LVIDs:         3.10 cm      LV e' medial:    3.70 cm/s LV PW:         0.90 cm      LV E/e' medial:  37.3 LV IVS:        0.90 cm      LV e' lateral:   6.31 cm/s LVOT diam:     1.90 cm      LV E/e' lateral: 21.9 LV SV:         73 LV SV Index:   48 LVOT Area:     2.84 cm  LV Volumes (MOD) LV vol d, MOD A2C: 104.0 ml LV vol d, MOD A4C: 101.0 ml LV vol s, MOD A2C: 38.5 ml LV vol s, MOD A4C: 39.7 ml LV SV MOD A2C:      65.5 ml LV SV MOD A4C:     101.0 ml LV SV MOD BP:      63.7 ml RIGHT VENTRICLE             IVC RV Basal diam:  3.40 cm     IVC diam: 2.40 cm RV S prime:     11.90 cm/s TAPSE (M-mode): 2.3 cm RVSP:           53.9 mmHg LEFT ATRIUM             Index        RIGHT ATRIUM            Index LA diam:        3.60 cm 2.35 cm/m   RA Pressure: 15.00 mmHg LA Vol (A2C):   58.6 ml 38.31 ml/m  RA Area:     10.60 cm LA Vol (A4C):   46.1 ml 30.14 ml/m  RA Volume:   20.10 ml   13.14 ml/m LA Biplane Vol: 52.0 ml 34.00 ml/m  AORTIC VALVE                    PULMONIC VALVE AV Area (Vmax):    2.50 cm     PV Vmax:          0.91 m/s AV Area (Vmean):   2.69 cm     PV Peak grad:     3.3 mmHg AV Area (VTI):     3.01 cm     PR End Diast Vel: 4.73 msec AV Vmax:           128.00 cm/s AV Vmean:          78.500 cm/s AV VTI:            0.242 m AV Peak Grad:      6.6 mmHg AV Mean Grad:      3.0 mmHg LVOT Vmax:         113.00 cm/s LVOT Vmean:        74.500 cm/s LVOT VTI:  0.257 m LVOT/AV VTI ratio: 1.06 AI PHT:            534 msec  AORTA Ao Root diam: 3.00 cm Ao Asc diam:  3.20 cm MITRAL VALVE                TRICUSPID VALVE MV Area (PHT): 5.84 cm     TR Peak grad:   38.9 mmHg MV Area VTI:   2.21 cm     TR Vmax:        312.00 cm/s MV Peak grad:  13.5 mmHg    Estimated RAP:  15.00 mmHg MV Mean grad:  6.0 mmHg     RVSP:           53.9 mmHg MV Vmax:       1.84 m/s MV Vmean:      120.5 cm/s   SHUNTS MV Decel Time: 130 msec     Systemic VTI:  0.26 m MR Peak grad: 77.4 mmHg     Systemic Diam: 1.90 cm MR Vmax:      440.00 cm/s MV E velocity: 138.00 cm/s MV A velocity: 159.00 cm/s MV E/A ratio:  0.87 Kardie Tobb DO Electronically signed by Dub Huntsman DO Signature Date/Time: 06/11/2024/4:37:58 PM    Final    DG Chest Port 1 View Result Date: 06/11/2024 CLINICAL DATA:  Shortness of breath. EXAM: PORTABLE CHEST 1 VIEW COMPARISON:  06/10/2024 FINDINGS: Stable normal sized heart. Interval bibasilar linear densities, left greater than right.  Stable mild chronic interstitial prominence. No pleural fluid. Tortuous and partially calcified thoracic aorta. Mild-to-moderate lower thoracic spine degenerative changes. IMPRESSION: 1. Interval bibasilar linear atelectasis, left greater than right. 2. Stable mild chronic interstitial lung disease. Electronically Signed   By: Elspeth Bathe M.D.   On: 06/11/2024 14:35    Assessment/Plan: 1.  Right ureteral calculus with obstruction and sepsis -s/p ureteral stent 10/16 - Kidney function has normalized -Catheter can be removed per primary team - WBC still significantly elevated and patient with hypoxic respiratory failure and septic encephalopathy  I discussed with family members that definitive stone management would take place in the next several weeks depending on how she recovers from this acute hospitalization.  Will need follow-up arranged Dr. Roseann Valli Shank, MD Urology 06/13/2024, 8:01 AM

## 2024-06-13 NOTE — Progress Notes (Signed)
 PROGRESS NOTE Anna [name]  FMW:[id number] DOB: Jul 03, 1934 DOA: 06/10/2024 PCP: Thedora Garnette HERO, MD  Brief Narrative/Hospital Course: Anna [name] is a 89 y.o. female with PMH of obstructive kidney stones, asthma, history of breast cancer, seasonal allergies, chronic kidney disease stage III, osteoarthritis, presents with right back pain, dysuria and generalized fatigue nausea without gross hematuria. In the ED found to have severe sepsis with hypotension, tachycardia lactic acidosis. Patient was resuscitated with IV fluids 2 L, antibiotics. Labs- troponin 29 >158,  la 4.8>4>6, UA leukocyte esterase WBC 6-10 CT chest and pelvis with contrast >>6 mm distal right ureteral calculus,severe right hydroureteronephrosis. 2. 5 mm nonobstructing right lower pole renal calculus.3. No acute findings in the chest EDP discussed with cardiology reviewed EKG felt demand ischemia Urology consulted and transferred to [facility] urgently for ureteral stent placement. S/p cystoscopy right ureteral stent Postprocedure hypotensive lethargic worsening lactic acidosis-received  albumin 25 g, midodrine x 2 and 1 l bolus IVF PCCM was consulted placed on HHFNC  Subjective: Seen and examined On bedside chair son and family at bedside Overnight mildly tachycardic afebrile on HFNC, saturating well BP stable Labs reviewed BMP stable, CBC with WBC 35.8 pl 78k foley got pulled out but back again Having cough and tachypneic and short of breath Remains confused  Assessment and plan:  Severe sepsis/septic shock with lactate more than 4 due to obstructive right  ureteral calculus 6 mm w/ severe right hydronephrosis Blood cultures 4/4 Klebsiella oxytoca Klebsiella oxytoca UTI: S/p cystoscopy with right ureteral stent placement, along with aggressive fluid hydration, albumin Lactic acidosis trending down, no fever vitals otherwise stable has leukocytosis LA down from 6> 2.3. Blood culture 4/4 Klebsiella oxytoca,  urine culture sent bacteria, C/S pending On admit Zosyn changed to ceftriaxone  10/16>>.  PCCM and urology following Recent Labs  Lab 06/10/24 1743 06/10/24 1744 06/11/24 0314 06/11/24 0625 06/11/24 0754 06/11/24 1259 06/11/24 1821 06/12/24 0309 06/12/24 0607 06/13/24 0250  WBC 3.5*  --  14.3*  --   --   --   --  34.0*  --  35.8*  LATICACIDVEN  --    < > 6.0* 5.4* 5.1* 5.7* 3.8*  --  2.3*  --    < > = values in this interval not displayed.   Elevated troponin from demand ischemia due to sepsis: echo reassuring with no WMA , treat underlying etiology.  Cardio input appreciated  Acute metabolic encephalopathy Deconditioning debility At risk for malnutrition: In the setting of sepsis.  Continue delirium precaution.  Patient needs able to converse well, appears confused intermittently agitated Augment diet supportive care   Acute respiratory failure with hypoxia: Pulmonary edema versus compensation for metabolic acidosis. On HFNC due to work of breathing and shortness of breath. Crackles on exam, continue respiratory support per pulmonary.  Hold off on diuretics as she is tachycardic as well Of note she had recent COVID infection.  AKI: resolved.    Leukocytosis: In the setting of sepsis also received dose of steroid.  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 Continue supportive care.  Husband died from aspiration pneumonia related to Parkinson disease and family worried about something similar   DVT prophylaxis: enoxaparin  (LOVENOX ) injection 40 mg Start: 06/14/24  1100 Code Status:   Code Status: Limited: Do not attempt resuscitation (DNR) -DNR-LIMITED -Do Not  Intubate/DNI  Family Communication: plan of care discussed with patient, and her daughter/family  at the bedside Patient status is: Remains hospitalized because of severity of illness Level of care: Stepdown  as she is critically ill  Dispo: The patient is from: HOME with her son            Anticipated disposition: TBD Objective: Vitals last 24 hrs: Vitals:   06/13/24 0833 06/13/24 0849 06/13/24 0851 06/13/24 1100  BP: (!) 165/60   (!) 159/59  Pulse: (!) 114 (!) 113  (!) 109  Resp: (!) 38 (!) 31  (!) 35  Temp:   99 F (37.2 C)   TempSrc:   Oral   SpO2: 94% 94%  95%  Weight:      Height:        Physical Examination: General exam: alert awake oriented to self and people only, confused HEENT:Oral mucosa moist, Ear/Nose WNL grossly Respiratory system: Bilaterally basal crackles present  Cardiovascular system: S1 & S2 +, No JVD. Gastrointestinal system: Abdomen soft,NT,ND, BS+ Nervous System: Alert, awake, moving all extremities, abdominal restraint Extremities: extremities warm, leg edema mild Skin: Warm, no rashes MSK: Normal muscle bulk,tone, power   Medications reviewed:  Scheduled Meds:  artificial tears  1 drop Both Eyes BID   azelastine   1 spray Each Nare BID   Chlorhexidine  Gluconate Cloth  6 each Topical Daily   [START ON 06/14/2024] enoxaparin  (LOVENOX ) injection  40 mg Subcutaneous Q24H   ipratropium-albuterol  3 mL Nebulization Q6H   latanoprost   1 drop Both Eyes QPM   midodrine  5 mg Oral TID WC   Continuous Infusions:  cefTRIAXone  (ROCEPHIN )  IV Stopped (06/13/24 1055)   Diet: Diet Order             Diet regular Fluid consistency: Thin  Diet effective now                    Data Reviewed: I have personally reviewed following labs and imaging studies ( see epic result tab) CBC: Recent Labs  Lab 06/10/24 1743 06/11/24 0314 06/12/24 0309 06/13/24 0250  WBC 3.5* 14.3* 34.0* 35.8*  NEUTROABS 3.2  --   --  33.4*  HGB 13.9 10.6* 12.0 11.5*  HCT  43.1 35.6* 38.3 36.3  MCV 91.9 97.5 93.0 92.6  PLT 180 72* 76* 78*   CMP: Recent Labs  Lab 06/10/24 1743 06/11/24 0314 06/11/24 1821 06/12/24 0309 06/13/24 0250  NA 142 143 141 142 144  K 3.8 3.6 4.1 4.4 3.7  CL 105 111 110 110 111  CO2 22 18* 18* 20* 24  GLUCOSE 133* 117* 101* 102* 99  BUN 16 15 20 23  33*  CREATININE 0.88 1.21* 0.91 1.00 0.78  CALCIUM 9.7 8.2* 8.4* 8.7* 9.0   GFR: Estimated Creatinine Clearance: 38.6 mL/min (by C-G formula based on SCr of 0.78 mg/dL). Recent Labs  Lab 06/10/24 1743 06/11/24 0314  AST 31 61*  ALT 18 26  ALKPHOS 110 109  BILITOT 0.8 0.6  PROT 6.5 4.3*  ALBUMIN 3.9 2.6*   No results for input(s): LIPASE, AMYLASE in the last 168 hours. No results for input(s): AMMONIA in the last 168 hours. Coagulation Profile:  Recent Labs  Lab 06/10/24 1743 06/11/24 0314  INR 1.0 1.6*   Unresulted Labs (From admission, onward)     Start     Ordered   06/18/24  0500  Creatinine, serum  (enoxaparin  (LOVENOX )    CrCl >/= 30 ml/min)  Weekly,   R     Comments: while on enoxaparin  therapy    06/11/24 0033   06/13/24 0744  Culture, blood (Routine X 2) w Reflex to ID Panel  BLOOD CULTURE X 2,   R      06/13/24 0743   06/12/24 0500  Basic metabolic panel with GFR  Daily,   R     Question:  Specimen collection method  Answer:  Lab=Lab collect   06/11/24 0721   06/12/24 0500  CBC  Daily,   R     Question:  Specimen collection method  Answer:  Lab=Lab collect   06/11/24 0721           Antimicrobials/Microbiology: Anti-infectives (From admission, onward)    Start     Dose/Rate Route Frequency Ordered Stop   06/11/24 1000  cefTRIAXone  (ROCEPHIN ) 2 g in sodium chloride  0.9 % 100 mL IVPB        2 g 200 mL/hr over 30 Minutes Intravenous Every 24 hours 06/11/24 0851     06/11/24 0200  piperacillin-tazobactam (ZOSYN) IVPB 3.375 g  Status:  Discontinued        3.375 g 12.5 mL/hr over 240 Minutes Intravenous Every 8 hours 06/10/24 2129 06/11/24  0851   06/11/24 0130  ceFEPIme (MAXIPIME) 2 g in sodium chloride  0.9 % 100 mL IVPB  Status:  Discontinued        2 g 200 mL/hr over 30 Minutes Intravenous  Once 06/11/24 0033 06/11/24 0039   06/10/24 1915  azithromycin  (ZITHROMAX ) 500 mg in sodium chloride  0.9 % 250 mL IVPB        500 mg 250 mL/hr over 60 Minutes Intravenous  Once 06/10/24 1907 06/10/24 2043   06/10/24 1745  piperacillin-tazobactam (ZOSYN) IVPB 3.375 g        3.375 g 100 mL/hr over 30 Minutes Intravenous  Once 06/10/24 1738 06/10/24 1845         Component Value Date/Time   SDES  06/11/2024 0148    URINE, RANDOM Performed at [facility], [address], [place], [place] [address]    [facility]  06/11/2024 0148    Urine cystoscope Performed at [facility], [address], [place], [place] [address]    CULT >=100,000 COLONIES/mL KLEBSIELLA OXYTOCA (A) 06/11/2024 0148   REPTSTATUS 06/13/2024 FINAL 06/11/2024 0148    Procedures: Procedure(s) (LRB): CYSTOURETEROSCOPY, WITH RETROGRADE PYELOGRAM AND STENT INSERTION (Right)   Mennie LAMY, MD [facility] 06/13/2024, 11:51 AM

## 2024-06-14 ENCOUNTER — Inpatient Hospital Stay (HOSPITAL_COMMUNITY)

## 2024-06-14 ENCOUNTER — Other Ambulatory Visit: Payer: Self-pay

## 2024-06-14 DIAGNOSIS — A419 Sepsis, unspecified organism: Secondary | ICD-10-CM | POA: Diagnosis not present

## 2024-06-14 DIAGNOSIS — R6521 Severe sepsis with septic shock: Secondary | ICD-10-CM | POA: Diagnosis not present

## 2024-06-14 LAB — CBC
HCT: 32.3 % — ABNORMAL LOW (ref 36.0–46.0)
Hemoglobin: 10.7 g/dL — ABNORMAL LOW (ref 12.0–15.0)
MCH: 30 pg (ref 26.0–34.0)
MCHC: 33.1 g/dL (ref 30.0–36.0)
MCV: 90.5 fL (ref 80.0–100.0)
Platelets: 91 K/uL — ABNORMAL LOW (ref 150–400)
RBC: 3.57 MIL/uL — ABNORMAL LOW (ref 3.87–5.11)
RDW: 14.7 % (ref 11.5–15.5)
WBC: 34.1 K/uL — ABNORMAL HIGH (ref 4.0–10.5)
nRBC: 0 % (ref 0.0–0.2)

## 2024-06-14 LAB — BASIC METABOLIC PANEL WITH GFR
Anion gap: 8 (ref 5–15)
BUN: 27 mg/dL — ABNORMAL HIGH (ref 8–23)
CO2: 25 mmol/L (ref 22–32)
Calcium: 8.9 mg/dL (ref 8.9–10.3)
Chloride: 111 mmol/L (ref 98–111)
Creatinine, Ser: 0.51 mg/dL (ref 0.44–1.00)
GFR, Estimated: >60 mL/min (ref >60)
Glucose, Bld: 148 mg/dL — ABNORMAL HIGH (ref 70–99)
Potassium: 3.1 mmol/L — ABNORMAL LOW (ref 3.5–5.1)
Sodium: 144 mmol/L (ref 135–145)

## 2024-06-14 LAB — RESPIRATORY PANEL BY PCR

## 2024-06-14 LAB — CULTURE, BLOOD (ROUTINE X 2): Special Requests: ADEQUATE

## 2024-06-14 LAB — PRO BRAIN NATRIURETIC PEPTIDE: Pro Brain Natriuretic Peptide: 10288 pg/mL — ABNORMAL HIGH (ref <300.0)

## 2024-06-14 MED ORDER — SODIUM CHLORIDE 0.9 % IV SOLN
100.0000 mg | Freq: Two times a day (BID) | INTRAVENOUS | Status: DC
  Administered 2024-06-14 – 2024-06-15 (×4): 100 mg via INTRAVENOUS
  Filled 2024-06-14 (×5): qty 100

## 2024-06-14 MED ORDER — PIPERACILLIN-TAZOBACTAM 3.375 G IVPB
3.3750 g | Freq: Three times a day (TID) | INTRAVENOUS | Status: DC
  Administered 2024-06-14 – 2024-06-15 (×5): 3.375 g via INTRAVENOUS
  Filled 2024-06-14 (×5): qty 50

## 2024-06-14 MED ORDER — SODIUM CHLORIDE 0.9% FLUSH: 10.0000 mL | INTRAVENOUS | Status: DC | PRN

## 2024-06-14 MED ORDER — BOOST / RESOURCE BREEZE PO LIQD CUSTOM: 1.0000 | Freq: Two times a day (BID) | ORAL | Status: DC

## 2024-06-14 MED ORDER — FUROSEMIDE 10 MG/ML IJ SOLN
40.0000 mg | Freq: Once | INTRAMUSCULAR | Status: AC
  Administered 2024-06-14: 40 mg via INTRAVENOUS
  Filled 2024-06-14: qty 4

## 2024-06-14 MED ORDER — POTASSIUM CHLORIDE 10 MEQ/100ML IV SOLN
10.0000 meq | INTRAVENOUS | Status: AC
  Administered 2024-06-14 (×4): 10 meq via INTRAVENOUS
  Filled 2024-06-14 (×4): qty 100

## 2024-06-14 MED ORDER — IPRATROPIUM-ALBUTEROL 0.5-2.5 (3) MG/3ML IN SOLN
3.0000 mL | RESPIRATORY_TRACT | Status: DC
  Administered 2024-06-14 – 2024-06-16 (×12): 3 mL via RESPIRATORY_TRACT
  Filled 2024-06-14 (×12): qty 3

## 2024-06-14 MED ORDER — ALUM & MAG HYDROXIDE-SIMETH 200-200-20 MG/5ML PO SUSP
30.0000 mL | ORAL | Status: DC | PRN
  Administered 2024-06-14: 30 mL via ORAL
  Filled 2024-06-14: qty 30

## 2024-06-14 MED ORDER — IPRATROPIUM-ALBUTEROL 0.5-2.5 (3) MG/3ML IN SOLN
3.0000 mL | Freq: Three times a day (TID) | RESPIRATORY_TRACT | Status: DC
  Administered 2024-06-14: 3 mL via RESPIRATORY_TRACT
  Filled 2024-06-14: qty 3

## 2024-06-14 MED ORDER — ENSURE PLUS HIGH PROTEIN PO LIQD
237.0000 mL | Freq: Two times a day (BID) | ORAL | Status: DC
  Administered 2024-06-14 – 2024-06-15 (×4): 237 mL via ORAL

## 2024-06-14 MED ORDER — SODIUM CHLORIDE 0.9% FLUSH
10.0000 mL | Freq: Two times a day (BID) | INTRAVENOUS | Status: DC
  Administered 2024-06-14: 40 mL
  Administered 2024-06-15 (×2): 20 mL

## 2024-06-14 NOTE — Progress Notes (Signed)
 Peripherally Inserted Central Catheter Placement  The IV Nurse has discussed with the patient and/or persons authorized to consent for the patient, the purpose of this procedure and the potential benefits and risks involved with this procedure.  The benefits include less needle sticks, lab draws from the catheter, and the patient may be discharged home with the catheter. Risks include, but not limited to, infection, bleeding, blood clot (thrombus formation), and puncture of an artery; nerve damage and irregular heartbeat and possibility to perform a PICC exchange if needed/ordered by physician.  Alternatives to this procedure were also discussed.  Bard Power PICC patient education guide, fact sheet on infection prevention and patient information card has been provided to patient /or left at bedside.    PICC Placement Documentation  PICC Double Lumen 06/14/24 Right Brachial 33 cm 0 cm (Active)  Indication for Insertion or Continuance of Line Vasoactive infusions;Limited venous access - need for IV therapy >5 days (PICC only);Poor Vasculature-patient has had multiple peripheral attempts or PIVs lasting less than 24 hours 06/14/24 2101  Exposed Catheter (cm) 0 cm 06/14/24 2101  Site Assessment Clean, Dry, Intact 06/14/24 2101  Lumen #1 Status Flushed;Saline locked;Blood return noted 06/14/24 2101  Lumen #2 Status Flushed;Saline locked;Blood return noted 06/14/24 2101  Dressing Type Transparent;Securing device 06/14/24 2101  Dressing Status Antimicrobial disc/dressing in place;Clean, Dry, Intact 06/14/24 2101  Line Care Connections checked and tightened 06/14/24 2101  Line Adjustment (NICU/IV Team Only) No 06/14/24 2101  Dressing Intervention New dressing;Adhesive placed at insertion site (IV team only);Adhesive placed around edges of dressing (IV team/ICU RN only) 06/14/24 2101  Dressing Change Due 06/21/24 06/14/24 2101       Anna [name] 06/14/2024, 9:01 PM

## 2024-06-14 NOTE — Plan of Care (Signed)
  Problem: Clinical Measurements: Goal: Ability to maintain clinical measurements within normal limits will improve Outcome: Progressing Goal: Diagnostic test results will improve Outcome: Progressing Goal: Respiratory complications will improve Outcome: Progressing Goal: Cardiovascular complication will be avoided Outcome: Progressing   Problem: Skin Integrity: Goal: Risk for impaired skin integrity will decrease Outcome: Progressing   Problem: Clinical Measurements: Goal: Diagnostic test results will improve Outcome: Progressing Goal: Signs and symptoms of infection will decrease Outcome: Progressing   Problem: Respiratory: Goal: Ability to maintain adequate ventilation will improve Outcome: Progressing

## 2024-06-14 NOTE — Progress Notes (Signed)
 Patient complains of SOB. Tachypnea noted (36-44 bpm) with increased wob. Speaks only in short phrases and experiencing dyspnea. Placed back to heated high flow. Respirations immediately dropped to mid 30's with decreased wob.

## 2024-06-14 NOTE — Progress Notes (Signed)
 PROGRESS NOTE CALLY NYGARD  FMW:[id number] DOB: 11/05/1933 DOA: 06/10/2024 PCP: Thedora Garnette HERO, MD  Brief Narrative/Hospital Course: Anna [name] is a 89 y.o. female with PMH of obstructive kidney stones, asthma, history of breast cancer, seasonal allergies, chronic kidney disease stage III, osteoarthritis, presents with right back pain, dysuria and generalized fatigue nausea without gross hematuria. In the ED found to have severe sepsis with hypotension, tachycardia lactic acidosis. Patient was resuscitated with IV fluids 2 L, antibiotics. Labs- troponin 29 >158,  la 4.8>4>6, UA leukocyte esterase WBC 6-10 CT chest and pelvis with contrast >>6 mm distal right ureteral calculus,severe right hydroureteronephrosis. 2. 5 mm nonobstructing right lower pole renal calculus.3. No acute findings in the chest EDP discussed with cardiology reviewed EKG felt demand ischemia Urology consulted and transferred to [facility] urgently for ureteral stent placement. S/p cystoscopy right ureteral stent Postprocedure hypotensive lethargic worsening lactic acidosis-received  albumin 25 g, midodrine x 2 and 1 l bolus IVF PCCM was consulted placed on HHFNC  Subjective: Seen and examined today Multiple family at the bedside Overnight k 3.1, wbc 34k, plt up at 91k. Patient has been short of breath, dyspneic, speaking in words> placed on HHFNC at 40 L and work of breathing some better RR in 30s to 40s tachycardiac Respiratory status remains tenuous, and visibly short of breath Received Lasix iv. Patient's son and daughter-in-law and daughter at the bedside.  Assessment and plan:  Severe sepsis/septic shock with lactate more than 4 due to obstructive right  ureteral calculus 6 mm w/ severe right hydronephrosis Blood cultures 4/4 Klebsiella oxytoca Klebsiella oxytoca UTI: S/p cystoscopy with right ureteral stent placement, along with aggressive fluid hydration, albumin Lactic acidosis trending down,  persistent leukocytosis presumed, BP stable Blood culture 4/4 Klebsiella oxytoca, urine culture similar bacteria-sensitive to Rocephin > changed to Zosyn 10/19 due to respiratory status PCCM and urology following-respiratory status tenuous Recent Labs  Lab 06/10/24 1743 06/10/24 1744 06/11/24 0314 06/11/24 0625 06/11/24 0754 06/11/24 1259 06/11/24 1821 06/12/24 0309 06/12/24 0607 06/13/24 0250 06/14/24 0255  WBC 3.5*  --  14.3*  --   --   --   --  34.0*  --  35.8* 34.1*  LATICACIDVEN  --    < > 6.0* 5.4* 5.1* 5.7* 3.8*  --  2.3*  --   --    < > = values in this interval not displayed.   Acute respiratory failure with hypoxia Pneumonia left base-concerning for aspiration pneumonia: In the setting of severe sepsis> with respiratory failure likely multifactorial, also concern for fluid overload and pneumonia  PCCM following closely, respiratory status tenuous. Of note she had recent COVID infection. Cxr this am- interval development of asymmetric diffuse airspace disease both lungs right more than left asymmetric edema or diffuse infection, persistent left base collapse/consolidation- agree w/ iv lasix, Zosyn/doxycycline  Palliative care consulted  Concern for fluid overload Acute diastolic CHF : Echo this admission with normal EF, G1 DD, proBNP elevated 10,288, and patient's tenuous respiratory status given IV Lasix x 1, reassess  Elevated troponin from demand ischemia due to sepsis: echo reassuring with no WMA , treat underlying etiology.  Cardio input appreciated  Acute metabolic encephalopathy Deconditioning debility At risk for malnutrition: In the setting of sepsis.Continue delirium, fall precaution.Augment diet supportive care   AKI: resolved.    Leukocytosis: In the setting of sepsis also received dose of steroid.  Monitor  Thrombocytopenia: Due to sepsis, monitor  Scoliosis/Osteoarthritis of multiple joints: Supportive care pain management  Squamous cell  carcinoma of skin of temple region: seeing derm q 79mo and under control  Deconditioning/debility Recent falls Goals of care Poor oral intake:  ambulates with cane and recent covid 6 wk and deconditinoned and slow since then. Continue supportive care.  Husband died from aspiration pneumonia related to Parkinson disease and family worried about something similar Patient with tenuous respiratory status, prognosis remains guarded, continue to monitor closely in stepdown setting, pulmonary critical care following closely and had discussed this morning about goals of care and agreed for palliative care evaluation and consulted. Also has very poor oral intake and failure to thrive.   DVT prophylaxis: enoxaparin  (LOVENOX ) injection 40 mg Start: 06/14/24 1100 Code Status:   Code Status: Limited: Do not attempt resuscitation (DNR) -DNR-LIMITED -Do Not Intubate/DNI  Family Communication: plan of care discussed with patient, and her daughter/family  at the bedside Patient status is: Remains hospitalized because of severity of illness Level of care: Stepdown  as she is critically ill  Dispo: The patient is from: HOME with her son            Anticipated disposition: TBD Objective: Vitals last 24 hrs: Vitals:   06/14/24 0858 06/14/24 0905 06/14/24 0935 06/14/24 1047  BP:  (!) 168/68    Pulse:  (!) 108 97 98  Resp:  (!) 44 (!) 27 (!) 26  Temp: (!) 100.6 F (38.1 C)     TempSrc: Oral     SpO2:  93% 94% 95%  Weight:      Height:       Physical Examination: General exam: alert awake dyspneic, on HHFNC, confused HEENT:Oral mucosa moist, Ear/Nose WNL grossly Respiratory system: Bilaterally crackles present diffusely, tachypneic Cardiovascular system: S1 & S2 +, No JVD. Gastrointestinal system: Abdomen soft,NT,ND, BS+ Nervous System: Alert, awake, moving all extremities, abdominal restraint Extremities: extremities warm, leg edema mild Skin: Warm, no rashes MSK: Normal muscle bulk,tone, power    Medications reviewed:  Scheduled Meds:  artificial tears  1 drop Both Eyes BID   azelastine   1 spray Each Nare BID   Chlorhexidine  Gluconate Cloth  6 each Topical Daily   enoxaparin  (LOVENOX ) injection  40 mg Subcutaneous Q24H   feeding supplement  237 mL Oral BID BM   ipratropium-albuterol  3 mL Nebulization Q4H   latanoprost   1 drop Both Eyes QPM   Continuous Infusions:  doxycycline  (VIBRAMYCIN ) IV     piperacillin-tazobactam (ZOSYN)  IV 12.5 mL/hr at 06/14/24 0958   potassium chloride 10 mEq (06/14/24 1000)   Diet: Diet Order             Diet regular Fluid consistency: Thin  Diet effective now                    Data Reviewed: I have personally reviewed following labs and imaging studies ( see epic result tab) CBC: Recent Labs  Lab 06/10/24 1743 06/11/24 0314 06/12/24 0309 06/13/24 0250 06/14/24 0255  WBC 3.5* 14.3* 34.0* 35.8* 34.1*  NEUTROABS 3.2  --   --  33.4*  --   HGB 13.9 10.6* 12.0 11.5* 10.7*  HCT 43.1 35.6* 38.3 36.3 32.3*  MCV 91.9 97.5 93.0 92.6 90.5  PLT 180 72* 76* 78* 91*   CMP: Recent Labs  Lab 06/11/24 0314 06/11/24 1821 06/12/24 0309 06/13/24 0250 06/14/24 0255  NA 143 141 142 144 144  K 3.6 4.1 4.4 3.7 3.1*  CL 111 110 110 111 111  CO2 18* 18* 20* 24 25  GLUCOSE  117* 101* 102* 99 148*  BUN 15 20 23  33* 27*  CREATININE 1.21* 0.91 1.00 0.78 0.51  CALCIUM 8.2* 8.4* 8.7* 9.0 8.9   GFR: Estimated Creatinine Clearance: 38.6 mL/min (by C-G formula based on SCr of 0.51 mg/dL). Recent Labs  Lab 06/10/24 1743 06/11/24 0314  AST 31 61*  ALT 18 26  ALKPHOS 110 109  BILITOT 0.8 0.6  PROT 6.5 4.3*  ALBUMIN 3.9 2.6*   No results for input(s): LIPASE, AMYLASE in the last 168 hours. No results for input(s): AMMONIA in the last 168 hours. Coagulation Profile:  Recent Labs  Lab 06/10/24 1743 06/11/24 0314  INR 1.0 1.6*   Unresulted Labs (From admission, onward)     Start     Ordered   06/18/24 0500  Creatinine, serum   (enoxaparin  (LOVENOX )    CrCl >/= 30 ml/min)  Weekly,   R     Comments: while on enoxaparin  therapy    06/11/24 0033   06/12/24 0500  Basic metabolic panel with GFR  Daily,   R     Question:  Specimen collection method  Answer:  Lab=Lab collect   06/11/24 0721   06/12/24 0500  CBC  Daily,   R     Question:  Specimen collection method  Answer:  Lab=Lab collect   06/11/24 0721           Antimicrobials/Microbiology: Anti-infectives (From admission, onward)    Start     Dose/Rate Route Frequency Ordered Stop   06/14/24 1100  doxycycline  (VIBRAMYCIN ) 100 mg in sodium chloride  0.9 % 250 mL IVPB        100 mg 125 mL/hr over 120 Minutes Intravenous 2 times daily 06/14/24 1012 06/19/24 0959   06/14/24 1000  piperacillin-tazobactam (ZOSYN) IVPB 3.375 g        3.375 g 12.5 mL/hr over 240 Minutes Intravenous Every 8 hours 06/14/24 0819     06/11/24 1000  cefTRIAXone  (ROCEPHIN ) 2 g in sodium chloride  0.9 % 100 mL IVPB  Status:  Discontinued        2 g 200 mL/hr over 30 Minutes Intravenous Every 24 hours 06/11/24 0851 06/14/24 0819   06/11/24 0200  piperacillin-tazobactam (ZOSYN) IVPB 3.375 g  Status:  Discontinued        3.375 g 12.5 mL/hr over 240 Minutes Intravenous Every 8 hours 06/10/24 2129 06/11/24 0851   06/11/24 0130  ceFEPIme (MAXIPIME) 2 g in sodium chloride  0.9 % 100 mL IVPB  Status:  Discontinued        2 g 200 mL/hr over 30 Minutes Intravenous  Once 06/11/24 0033 06/11/24 0039   06/10/24 1915  azithromycin  (ZITHROMAX ) 500 mg in sodium chloride  0.9 % 250 mL IVPB        500 mg 250 mL/hr over 60 Minutes Intravenous  Once 06/10/24 1907 06/10/24 2043   06/10/24 1745  piperacillin-tazobactam (ZOSYN) IVPB 3.375 g        3.375 g 100 mL/hr over 30 Minutes Intravenous  Once 06/10/24 1738 06/10/24 1845         Component Value Date/Time   SDES  06/13/2024 0912    BLOOD LEFT ARM BOTTLES DRAWN AEROBIC AND ANAEROBIC Performed at [facility], [address]  [place], [place] [address]    SDES  06/13/2024 [phone]    BLOOD RIGHT ARM BOTTLES DRAWN AEROBIC ONLY Performed at [facility], [address], [place], [place] [address]    JULIANE  06/13/2024 [phone]  Blood Culture results may not be optimal due to an inadequate volume of blood received in culture bottles Performed at [facility], [address] [place], [place] [address]    SPECREQUEST  06/13/2024 0912    Blood Culture results may not be optimal due to an inadequate volume of blood received in culture bottles Performed at [facility], [address], [place], [place] [address]    CULT  06/13/2024 0912    NO GROWTH < 24 HOURS Performed at [facility], [address] [place], [place] [address]    CULT  06/13/2024 0912    NO GROWTH < 24 HOURS Performed at [facility], [address] [place], [place] [address]    REPTSTATUS PENDING 06/13/2024 0912   REPTSTATUS PENDING 06/13/2024 0912    Procedures: Procedure(s) (LRB): CYSTOURETEROSCOPY, WITH RETROGRADE PYELOGRAM AND STENT INSERTION (Right)   Mennie LAMY, MD [facility] 06/14/2024, 11:03 AM

## 2024-06-14 NOTE — Progress Notes (Addendum)
 NAME:  Anna [name], MRN:  [id number], DOB:  1934/05/21, LOS: 4 ADMISSION DATE:  06/10/2024, CONSULTATION DATE:  06/11/24 REFERRING MD:  Dr. Christobal, CHIEF COMPLAINT:  rising lactic   History of Present Illness:   53 yoF with PMH as below significant for asthma, CKD III, and obstructive kidney stones who presented 10/15 with right back pain, fever, confusion, fatigue, and nausea since Wednesday am presented found to have severe sepsis due to right obstructive ureteral calculus with severe hydronephrosis with initial lactic acidosis and hypotension which improved with IV fluids, abx, and midodrine.  Underwent cystoscopy with right ureteral stent placement 10/16 am with urology.  BC showing GNR with Klebseilla oxytoca.  Broad spectrum abx narrowed to ceftriaxone .  Cardiology consulted for elevated troponin felt secondary to demand ischemia.  Worsening SOB throughout the day but stable O2 requirements on 2L.  Lactic trending up, 6> 5.4> 5.1>5.7, and concern swelling around lips and tongue.  Solumedrol x 1 given.  CXR showing bibasilar atelectasis and chronic interstitial changes.  BP remains stable and afebrile throughout the day.  Pt currently denies any pain including CP, nausea, or obvious feeling of tongue or lip swelling.  Feels confused.  C/o of worsening SOB with tachypnea this afternoon and feels confused, can't get her words out.  Remains otherwise non focal neurologically.  Has felt more congestion, no cough, since having COVID 4 weeks ago and reports ongoing allergies.  SARS/ flu/ RSV was negative on admit.  PCCM consulted for further evaluation.   Pertinent  Medical History   Past Medical History:  Diagnosis Date   Allergy    seasonal also with dogs and cats   Arthritis    Asthma    hx of in childhood    Bladder infection    hx of    Cancer (HCC)    breast cancer bilat has had double mastectomy    Chronic kidney disease    hx of kidney stones last one pasted 1 month ago    COVID     Fibrocystic breast disease    H/O blood clots    during delivery occurred 50 years ago per left arm    PONV (postoperative nausea and vomiting)    pt states stays very sleepy for days following anesthesia    Significant Hospital Events: Including procedures, antibiotic start and stop dates in addition to other pertinent events   10/16 admitted, s/p ureteral stent, cards/ PCCM consult 10/17 was on [facility]  10/18 transitioned to [facility]  Interim History / Subjective:   Minimal sleep overnight Had increase in respiratory distress overnight Chest radiograph with new bilateral infiltrates Febrile to 100.51F yesterday and 100.6 this AM Updated Son, daughter and son-in law at bedside  Objective    Blood pressure (!) 159/60, pulse (!) 113, temperature 97.9 F (36.6 C), temperature source Oral, resp. rate (!) 39, height 4' 11 (1.499 m), weight 58.4 kg, SpO2 92%.    FiO2 (%):  [55 %] 55 %   Intake/Output Summary (Last 24 hours) at 06/14/2024 0737 Last data filed at 06/14/2024 9277 Gross per 24 hour  Intake 720 ml  Output 1100 ml  Net -380 ml   Filed Weights   06/10/24 2337  Weight: 58.4 kg   Examination: General:  elderly female, ill appearing, moderate respiratory distress HEENT: [place]/AT, moist mucous membranes Neuro: Awake, alert moving all extrmities CV: Tacycardic PULM:  tachypneic, course breath sounds with scattered wheezes/crackles Extremities: warm/dry, trace edema  Skin: no rashes  UOP 1L/ 24hrs Net +3.3L WBC 35.8  Resolved problem list   Assessment and Plan   Severe sepsis secondary to right obstructive ureteral calculus s/p stent placement Klebsiella oxytoca bacteremia  - ceftriaxone  broadened to zosyn today due to concern for pneumonia - f/u repeat blood cultures - cardiology following.  No plans for TEE at this time given bacteremia - urology following  Hypoxemic Respiratory Failure Aspiration Pneumonia vs Hospital Acquired Pneumonia -  multifactorial in sepsis, atelectasis, and acidosis driving tachypnea, pain, possibly some fluid overload.   - Can't rule out underlying ILD process with mosaic pattern, evidence of gas trapping and subpleural scarring/ atelectasis seen on CT 10/15 and previous CT 02/2022 - CXR 10/19 concerning for pulmonary edema vs pneumonia - antibiotics broadened to zosyn and doxycycline  - lasix 40mg  IV - plan to alternate between bipap and HHFNC today for work of breathing - goal sat > 92% - cont aggressive IS, mobilize, pulm hygiene - duonebs schedule q6 hours - continue zyrtec/ astelin   Elevated troponin with suspected demand ischemia - 10/16 Echo: LVEF 60-65%, G1DD. RV normal size and function; moderately elevated PASP 53. Mildly elevated LA. Mild MR, mild to moderate MS.  Anemia Thrombocytopenia - in setting of sepsis - slowly improving - continue to monitor  Acute Metabolic encephalopathy and delirium - supportive care, delirium precautions, PT/ OT.  Remains non focal. Melatonin qHS.  Ideally avoid narcotics, tylenol  preferred - consider adding precedex at bedtime  GOC - agreed on palliative care consult with family - patient not trending in overall great direction, family is aware - discussed potential need for feed tube if aggressive care measures want to be pursued.   Labs   CBC: Recent Labs  Lab 06/10/24 1743 06/11/24 0314 06/12/24 0309 06/13/24 0250 06/14/24 0255  WBC 3.5* 14.3* 34.0* 35.8* 34.1*  NEUTROABS 3.2  --   --  33.4*  --   HGB 13.9 10.6* 12.0 11.5* 10.7*  HCT 43.1 35.6* 38.3 36.3 32.3*  MCV 91.9 97.5 93.0 92.6 90.5  PLT 180 72* 76* 78* 91*    Basic Metabolic Panel: Recent Labs  Lab 06/11/24 0314 06/11/24 1821 06/12/24 0309 06/13/24 0250 06/14/24 0255  NA 143 141 142 144 144  K 3.6 4.1 4.4 3.7 3.1*  CL 111 110 110 111 111  CO2 18* 18* 20* 24 25  GLUCOSE 117* 101* 102* 99 148*  BUN 15 20 23  33* 27*  CREATININE 1.21* 0.91 1.00 0.78 0.51  CALCIUM 8.2*  8.4* 8.7* 9.0 8.9   GFR: Estimated Creatinine Clearance: 38.6 mL/min (by C-G formula based on SCr of 0.51 mg/dL). Recent Labs  Lab 06/11/24 0314 06/11/24 0625 06/11/24 0754 06/11/24 1259 06/11/24 1821 06/12/24 0309 06/12/24 0607 06/13/24 0250 06/14/24 0255  WBC 14.3*  --   --   --   --  34.0*  --  35.8* 34.1*  LATICACIDVEN 6.0*   < > 5.1* 5.7* 3.8*  --  2.3*  --   --    < > = values in this interval not displayed.    Liver Function Tests: Recent Labs  Lab 06/10/24 1743 06/11/24 0314  AST 31 61*  ALT 18 26  ALKPHOS 110 109  BILITOT 0.8 0.6  PROT 6.5 4.3*  ALBUMIN 3.9 2.6*   No results for input(s): LIPASE, AMYLASE in the last 168 hours. No results for input(s): AMMONIA in the last 168 hours.  ABG No results found for: PHART, PCO2ART, PO2ART, HCO3, TCO2, ACIDBASEDEF, O2SAT   Coagulation Profile: Recent  Labs  Lab 06/10/24 1743 06/11/24 0314  INR 1.0 1.6*    Cardiac Enzymes: No results for input(s): CKTOTAL, CKMB, CKMBINDEX, TROPONINI in the last 168 hours.  HbA1C: Hgb A1c MFr Bld  Date/Time Value Ref Range Status  03/21/2022 01:59 AM 5.2 4.8 - 5.6 % Final    Comment:    (NOTE) Pre diabetes:          5.7%-6.4%  Diabetes:              >6.4%  Glycemic control for   <7.0% adults with diabetes     CBG: No results for input(s): GLUCAP in the last 168 hours. Allergies Allergies  Allergen Reactions   Demerol [Meperidine] Nausea And Vomiting   Sulfa Antibiotics Nausea And Vomiting    Critical care time:     CRITICAL CARE Performed by: Dorn KATHEE Chill   Total critical care time: 35 minutes  Critical care time was exclusive of separately billable procedures and treating other patients.  Critical care was necessary to treat or prevent imminent or life-threatening deterioration.  Critical care was time spent personally by me on the following activities: development of treatment plan with patient and/or surrogate as  well as nursing, discussions with consultants, evaluation of patient's response to treatment, examination of patient, obtaining history from patient or surrogate, ordering and performing treatments and interventions, ordering and review of laboratory studies, ordering and review of radiographic studies, pulse oximetry, re-evaluation of patient's condition and participation in multidisciplinary rounds.  Dorn Chill, MD [facility] Office: [phone]   See Amion for personal pager PCCM on call pager [phone] until 7pm. Please call Elink 7p-7a. [phone]

## 2024-06-15 DIAGNOSIS — J690 Pneumonitis due to inhalation of food and vomit: Secondary | ICD-10-CM | POA: Diagnosis not present

## 2024-06-15 DIAGNOSIS — Z515 Encounter for palliative care: Secondary | ICD-10-CM | POA: Diagnosis not present

## 2024-06-15 DIAGNOSIS — D649 Anemia, unspecified: Secondary | ICD-10-CM

## 2024-06-15 DIAGNOSIS — Z7189 Other specified counseling: Secondary | ICD-10-CM | POA: Diagnosis not present

## 2024-06-15 DIAGNOSIS — Z66 Do not resuscitate: Secondary | ICD-10-CM | POA: Diagnosis not present

## 2024-06-15 DIAGNOSIS — R6521 Severe sepsis with septic shock: Secondary | ICD-10-CM | POA: Diagnosis not present

## 2024-06-15 DIAGNOSIS — J9601 Acute respiratory failure with hypoxia: Secondary | ICD-10-CM | POA: Diagnosis not present

## 2024-06-15 DIAGNOSIS — Z711 Person with feared health complaint in whom no diagnosis is made: Secondary | ICD-10-CM

## 2024-06-15 DIAGNOSIS — A419 Sepsis, unspecified organism: Secondary | ICD-10-CM | POA: Diagnosis not present

## 2024-06-15 DIAGNOSIS — R6520 Severe sepsis without septic shock: Secondary | ICD-10-CM | POA: Diagnosis not present

## 2024-06-15 LAB — CBC
HCT: 31.8 % — ABNORMAL LOW (ref 36.0–46.0)
Hemoglobin: 10.3 g/dL — ABNORMAL LOW (ref 12.0–15.0)
MCH: 29 pg (ref 26.0–34.0)
MCHC: 32.4 g/dL (ref 30.0–36.0)
MCV: 89.6 fL (ref 80.0–100.0)
Platelets: 100 K/uL — ABNORMAL LOW (ref 150–400)
RBC: 3.55 MIL/uL — ABNORMAL LOW (ref 3.87–5.11)
RDW: 14.9 % (ref 11.5–15.5)
WBC: 18.8 K/uL — ABNORMAL HIGH (ref 4.0–10.5)
nRBC: 0 % (ref 0.0–0.2)

## 2024-06-15 LAB — BASIC METABOLIC PANEL WITH GFR
Anion gap: 7 (ref 5–15)
BUN: 23 mg/dL (ref 8–23)
CO2: 28 mmol/L (ref 22–32)
Calcium: 8.6 mg/dL — ABNORMAL LOW (ref 8.9–10.3)
Chloride: 110 mmol/L (ref 98–111)
Creatinine, Ser: 0.51 mg/dL (ref 0.44–1.00)
GFR, Estimated: >60 mL/min (ref >60)
Glucose, Bld: 132 mg/dL — ABNORMAL HIGH (ref 70–99)
Potassium: 3.3 mmol/L — ABNORMAL LOW (ref 3.5–5.1)
Sodium: 145 mmol/L (ref 135–145)

## 2024-06-15 MED ORDER — POTASSIUM CHLORIDE 10 MEQ/100ML IV SOLN
10.0000 meq | INTRAVENOUS | Status: AC
  Administered 2024-06-15 (×2): 10 meq via INTRAVENOUS
  Filled 2024-06-15 (×2): qty 100

## 2024-06-15 MED ORDER — FUROSEMIDE 10 MG/ML IJ SOLN
40.0000 mg | Freq: Four times a day (QID) | INTRAMUSCULAR | Status: AC
  Administered 2024-06-15 (×2): 40 mg via INTRAVENOUS
  Filled 2024-06-15 (×2): qty 4

## 2024-06-15 MED ORDER — MORPHINE SULFATE (PF) 2 MG/ML IV SOLN
1.0000 mg | INTRAVENOUS | Status: DC | PRN
  Administered 2024-06-15 (×3): 1 mg via INTRAVENOUS
  Filled 2024-06-15 (×3): qty 1

## 2024-06-15 NOTE — IPAL (Signed)
  Interdisciplinary Goals of Care Family Meeting   Date carried out: 06/15/2024  Location of the meeting: Bedside  Member's involved: Physician, Bedside Registered Nurse, Family Member or next of kin, and Palliative care team member  Durable Power of Attorney or acting medical decision maker: n/a patient of sound mind    Discussion: We discussed goals of care for Anna [name] .  Progressive worsening respiratory status despite escalation of therapies particularly for antibiotics with pneumonia and diuresis.  Discussed given trajectory fear things will continue to get worse.  Patient states she does not want to live like this.  I stated that we give it more time, it is arbitrary.  But with the extra time is more likely to be worse off than improved given worsening trajectory.  I encouraged her to discuss with family discussed next apps including comfort measures versus additional time with current care.  Already DNR.  Code status:   Code Status: Limited: Do not attempt resuscitation (DNR) -DNR-LIMITED -Do Not Intubate/DNI    Disposition: Continue current acute care  Time spent for the meeting: 12 minutes    Anna JONELLE Beals, MD  06/15/2024, 1:48 PM

## 2024-06-15 NOTE — TOC Initial Note (Signed)
 Transition of Care [facility]) - Initial/Assessment Note    Patient Details  Name: Anna [name] MRN: [id number] Date of Birth: 1934/01/02  Transition of Care [facility]) CM/SW Contact:    Jon ONEIDA Anon, RN Phone Number: 06/15/2024, 3:33 PM  Clinical Narrative:                 Pt is from home with family. ICM acknowledges PT/OT rec for SNF at DC. Pt requiring an increased demand for O2 at this time. Pt and pt family having GOC discussions with Provider and Palliative medicine team. ICM will continue to follow for any new recommendations or DC needs.    Expected Discharge Plan:  (TBD) Barriers to Discharge: Continued Medical Work up   Patient Goals and CMS Choice Patient states their goals for this hospitalization and ongoing recovery are:: Having discussions with family and care team to determine next steps CMS Medicare.gov Compare Post Acute Care list provided to:: Other (Comment Required) (NA) Choice offered to / list presented to : NA [place] ownership interest in [facility].provided to:: Parent NA    Expected Discharge Plan and Services In-house Referral: Hospice / Palliative Care Discharge Planning Services: CM Consult Post Acute Care Choice: Durable Medical Equipment [facility]) Living arrangements for the past 2 months: Single Family Home                 DME Arranged: N/A DME Agency: NA       HH Arranged: NA HH Agency: NA        Prior Living Arrangements/Services Living arrangements for the past 2 months: Single Family Home Lives with:: Adult Children Patient language and need for interpreter reviewed:: Yes Do you feel safe going back to the place where you live?: Yes      Need for Family Participation in Patient Care: Yes (Comment) Care giver support system in place?: Yes (comment) Current home services: DME [facility]) Criminal Activity/Legal Involvement Pertinent to Current Situation/Hospitalization: No - Comment as needed  Activities of Daily Living    ADL Screening (condition at time of admission) Independently performs ADLs?: Yes (appropriate for developmental age) Is the patient deaf or have difficulty hearing?: No Does the patient have difficulty seeing, even when wearing glasses/contacts?: No Does the patient have difficulty concentrating, remembering, or making decisions?: No  Permission Sought/Granted Permission sought to share information with : Family Supports    Share Information with NAME: Loyola Channel (Daughter)  [phone]           Emotional Assessment Appearance:: Appears stated age     Orientation: : Oriented to Self, Oriented to Place, Oriented to  Time, Oriented to Situation Alcohol / Substance Use: Not Applicable Psych Involvement: No (comment)  Admission diagnosis:  Hydronephrosis of right kidney [N13.30] Urinary tract obstruction due to kidney stone [N20.0, N13.8] Right nephrolithiasis [N20.0] Sepsis with acute organ dysfunction, due to unspecified organism, unspecified organ dysfunction type, unspecified whether septic shock present (HCC) [A41.9, R65.20] Patient Active Problem List   Diagnosis Date Noted   Right ureteral calculus 06/11/2024   Septic shock (HCC) 06/11/2024   Acute hypoxic respiratory failure (HCC) 06/11/2024   Klebsiella infection 06/11/2024   Acute cystitis with hematuria 06/11/2024   Urinary tract obstruction due to kidney stone 06/10/2024   Weight loss 12/20/2023   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 05/28/2023   Kyphoscoliosis 01/16/2023   Physical deconditioning 01/16/2023   Post herpetic neuralgia 05/07/2022   Seborrheic keratoses 02/14/2022   History of basal cell  carcinoma (BCC) 10/09/2021   Basal cell carcinoma (BCC) of dorsum of nose 10/09/2021   Osteoarthritis of multiple joints 01/05/2021   Chronic bilateral low back pain without sciatica 12/07/2019   Idiopathic stabbing headache 11/02/2019   Sensorineural hearing loss (SNHL) 08/07/2018   Idiopathic  peripheral neuropathy 01/25/2016   Nephrolithiasis 03/29/2015   History of total knee arthroplasty 01/05/2015   Rosacea 07/14/2014   Degenerative joint disease of cervical and lumbar spine 07/14/2014   Microscopic hematuria 07/14/2014   Mammogram abnormal 07/14/2014   History of breast cancer in female 07/14/2014   Atrophic vaginitis 07/14/2014   Allergic rhinitis 07/14/2014   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 [place] [address] Phone: [phone] Fax: [phone]  [place] [facility] [address] [place] [address] Phone: [phone] Fax: [phone]  [id number] - [place], [place] - [address] [place] [address] Phone: [phone] Fax: [phone]     Social Drivers of Health (SDOH) Social History: SDOH Screenings   Food Insecurity: No Food Insecurity (06/11/2024)  Housing: Low Risk  (06/11/2024)  Transportation Needs: No Transportation Needs (06/11/2024)  Utilities: Not At Risk (06/11/2024)  Alcohol Screen: Low Risk  (06/21/2023)  Depression (PHQ2-9): Low Risk  (06/21/2023)  Financial Resource Strain: Low Risk  (06/21/2023)  Physical Activity: Inactive (06/21/2023)  Social Connections: Moderately Isolated (06/11/2024)  Stress: No Stress Concern Present (06/21/2023)  Tobacco Use: Low Risk  (06/10/2024)  Health Literacy: Adequate Health Literacy (06/21/2023)   SDOH Interventions:     Readmission Risk Interventions    06/15/2024    3:30 PM  Readmission Risk Prevention Plan  Post Dischage Appt Complete  Medication Screening Complete  Transportation Screening Complete

## 2024-06-15 NOTE — Progress Notes (Signed)
       Overnight   NAME: Anna [name] MRN: [id number] DOB : 1934-04-06    Date of Service   06/15/2024   HPI/Events of Note    Notified by RN for patient/family request to change CODE STATUS to comfort measures only (CMO)  Brief history 89 year old female with past medical history of obstructive kidney stones, asthma, history of breast cancer, seasonal allergies, chronic kidney disease stage III, osteoarthritis.  Admitted through ER with generalized fatigue and nausea without hematuria  CCM was consulted during stay, IPAL completed by CCM today 10/20 1347 hrs. Palliative care consulted visited today 10/20 @ 1414 hours  Bedside visit Spoke with patient who is awake and oriented, and family Wyman and [name]) verified with all members that patient does want to be comfort measures only (CMO) now. Patient herself acknowledges no further treatments other than existing oxygen and pain control. Family agrees and acknowledges also.  Notified E-link of change in status due to very recent consultation/involvement.     Interventions/ Plan   CODE STATUS changed to comfort measures only (CMO) Will discontinue treatments as indicated and agreed with family. Open family visitation.       Lynwood Kipper BSN MSNA MSN ACNPC-AG Acute Care Nurse Practitioner [facility]

## 2024-06-15 NOTE — Progress Notes (Signed)
 PROGRESS NOTE Anna [name]  FMW:[id number] DOB: 1934-04-28 DOA: 06/10/2024 PCP: Thedora Garnette HERO, MD  Brief Narrative/Hospital Course: [name] is a 89 y.o. female with PMH of obstructive kidney stones, asthma, history of breast cancer, seasonal allergies, chronic kidney disease stage III, osteoarthritis, presents with right back pain, dysuria and generalized fatigue nausea without gross hematuria. In the ED found to have severe sepsis with hypotension, tachycardia lactic acidosis. Patient was resuscitated with IV fluids 2 L, antibiotics. Labs- troponin 29 >158,  la 4.8>4>6, UA leukocyte esterase WBC 6-10 CT chest and pelvis with contrast >>6 mm distal right ureteral calculus,severe right hydroureteronephrosis. 2. 5 mm nonobstructing right lower pole renal calculus.3. No acute findings in the chest EDP discussed with cardiology reviewed EKG felt demand ischemia Urology consulted and transferred to [facility] urgently for ureteral stent placement. S/p cystoscopy right ureteral stent Postprocedure hypotensive lethargic worsening lactic acidosis-received  albumin 25 g, midodrine x 2 and 1 l bolus IVF PCCM was consulted placed on HHFNC.  Patient having acute metabolic encephalopathy in the setting of sepsis Respiratory status has been tenuous, patient has been placed on BiPAP 10/19 palliative care consulted for goals of care  Subjective: Seen and examined Overnight remains on BiPAP BP stable but tachypneic, no fever since yesterday 4 PM WBC 18.8 platelet 100 mild hypokalemia 3.3 Patient extremely short of breath and in distress while transitioning off BiPAP Family wanting to address goals of care, palliative to see today Patient is alert oriented visibly short of breath and confused  Assessment and plan:  Severe sepsis/septic shock with lactate more than 4 due to obstructive right  ureteral calculus 6 mm w/ severe right hydronephrosis Blood cultures 4/4 Klebsiella  oxytoca Klebsiella oxytoca UTI: S/p cystoscopy with right ureteral stent,aggressive IVF, s/p albumin Lactic acidosis trended down,leukocytosis improving, and BP remains stable-having tenuous respiratory status  Blood culture 4/4 Klebsiella oxytoca, urine culture similar bacteria-sensitive to Rocephin > changed to Zosyn 10/19 due to respiratory status PCCM and urology following.  Planning for goals of care discussed with palliative care today Recent Labs  Lab 06/11/24 0314 06/11/24 0625 06/11/24 0754 06/11/24 1259 06/11/24 1821 06/12/24 0309 06/12/24 0607 06/13/24 0250 06/14/24 0255 06/15/24 0500  WBC 14.3*  --   --   --   --  34.0*  --  35.8* 34.1* 18.8*  LATICACIDVEN 6.0* 5.4* 5.1* 5.7* 3.8*  --  2.3*  --   --   --    Acute respiratory failure with hypoxia Pneumonia left base-concerning for aspiration pneumonia: In the setting of severe sepsis> with respiratory failure likely multifactorial, also concern for fluid overload and pneumonia  PCCM following closely,CXR 10/ 19  asymmetric diffuse airspace disease both lungs right more than left asymmetric edema or diffuse infection, persistent left base collapse/consolidation- agree w/ iv lasix, Zosyn/doxycycline  AND with tenuous respiratory status> placed on BiPAP,HHFNC, Lasix x 1, changed to doxycycline  and Zosyn on 10/19. Continue supplement oxygen BiPAP, appreciate pulmonary input-got morphine and started IV Lasix every 6 hours for respiratory distress this morning Palliative care consulted for goals of care  Concern for fluid overload Acute diastolic CHF : Echo this admission with normal EF, G1 DD, proBNP elevated 10,288, s/p IV Lasix x 1  Elevated troponin from demand ischemia due to sepsis: echo reassuring with no WMA , treat underlying etiology.  Cardio input appreciated  Acute metabolic encephalopathy Deconditioning debility At risk for malnutrition: Patient has been confused in the setting of sepsis hypoxia, remains confused,  in distress,  Continue  to treat underlying etiology, continue delirium/fall precaution.   AKI: resolved.    Hypokalemia: Replace k.  Anemia of critical illness: Hemoglobin stable  Leukocytosis: In the setting of sepsis also received dose of steroid.  Monitor  Thrombocytopenia: Due to sepsis,improving.    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 Continue supportive care.  Husband died from aspiration pneumonia related to Parkinson disease and family worried about something similar Patient with tenuous respiratory status, prognosis remains guarded, continue to monitor closely in stepdown setting, pulmonary critical care following  Palliative care consulted for goals of care She alos has very poor oral intake and failure to thrive. Family does not want the patient to suffer and does not want to see her in distress, waiting to talk with palliative care to address goals of care this morning  DVT prophylaxis: enoxaparin  (LOVENOX ) injection 40 mg Start: 06/14/24 1100 Code Status:   Code Status: Limited: Do not attempt resuscitation (DNR) -DNR-LIMITED -Do Not Intubate/DNI  Family Communication: plan of care discussed with patient, and her daughter/family  at the bedside Patient status is: Remains hospitalized because of severity of illness Level of care: Stepdown  as she is critically ill  Dispo: The patient is from: HOME with her son            Anticipated disposition: TBD Objective: Vitals last 24 hrs: Vitals:   06/15/24 0700 06/15/24 0744 06/15/24 0750 06/15/24 0856  BP: (!) 160/58     Pulse: (!) 105 (!) 109    Resp: (!) 41 (!) 27    Temp:   (!) 100.7 F (38.2 C)   TempSrc:   Axillary   SpO2: 95%  96%  94%  Weight:      Height:       Physical Examination: General exam: Alert awake but short of breath, in distress, confused HEENT:Oral mucosa moist, Ear/Nose WNL grossly Respiratory system: Bilaterally crackles present, using accessory muscle. Cardiovascular system: S1 & S2 +, No JVD. Gastrointestinal system: Abdomen soft,NT,ND, BS+ Nervous System: Alert, awake, moving all extremities, abdominal restraint Extremities: extremities warm, leg edema mild Skin: Warm, no rashes MSK: Normal muscle bulk,tone, power   Medications reviewed:  Scheduled Meds:  artificial tears  1 drop Both Eyes BID   azelastine   1 spray Each Nare BID   Chlorhexidine  Gluconate Cloth  6 each Topical Daily   enoxaparin  (LOVENOX ) injection  40 mg Subcutaneous Q24H   feeding supplement  237 mL Oral BID BM   furosemide  40 mg Intravenous Q6H   ipratropium-albuterol  3 mL Nebulization Q4H   latanoprost   1 drop Both Eyes QPM   sodium chloride  flush  10-40 mL Intracatheter Q12H   Continuous Infusions:  doxycycline  (VIBRAMYCIN ) IV Stopped (06/15/24 0000)   piperacillin-tazobactam (ZOSYN)  IV 3.375 g (06/15/24 0934)   Diet: Diet Order             Diet regular Fluid consistency: Thin  Diet effective now                    Data Reviewed: I have personally reviewed following labs and imaging studies ( see epic result tab) CBC: Recent Labs  Lab 06/10/24 1743 06/11/24 0314 06/12/24 0309 06/13/24 0250 06/14/24 0255 06/15/24 0500  WBC  3.5* 14.3* 34.0* 35.8* 34.1* 18.8*  NEUTROABS 3.2  --   --  33.4*  --   --   HGB 13.9 10.6* 12.0 11.5* 10.7* 10.3*  HCT 43.1 35.6* 38.3 36.3 32.3* 31.8*  MCV 91.9 97.5 93.0 92.6 90.5 89.6  PLT 180 72* 76* 78* 91* 100*   CMP: Recent Labs  Lab 06/11/24 1821 06/12/24 0309 06/13/24 0250 06/14/24 0255 06/15/24 0500  NA 141 142 144 144 145  K 4.1 4.4 3.7 3.1* 3.3*  CL 110 110 111 111 110  CO2 18* 20* 24 25 28   GLUCOSE 101* 102* 99 148* 132*  BUN 20 23 33* 27* 23   CREATININE 0.91 1.00 0.78 0.51 0.51  CALCIUM 8.4* 8.7* 9.0 8.9 8.6*   GFR: Estimated Creatinine Clearance: 38.6 mL/min (by C-G formula based on SCr of 0.51 mg/dL). Recent Labs  Lab 06/10/24 1743 06/11/24 0314  AST 31 61*  ALT 18 26  ALKPHOS 110 109  BILITOT 0.8 0.6  PROT 6.5 4.3*  ALBUMIN 3.9 2.6*   No results for input(s): LIPASE, AMYLASE in the last 168 hours. No results for input(s): AMMONIA in the last 168 hours. Coagulation Profile:  Recent Labs  Lab 06/10/24 1743 06/11/24 0314  INR 1.0 1.6*   Unresulted Labs (From admission, onward)     Start     Ordered   06/18/24 0500  Creatinine, serum  (enoxaparin  (LOVENOX )    CrCl >/= 30 ml/min)  Weekly,   R     Comments: while on enoxaparin  therapy    06/11/24 0033   06/12/24 0500  Basic metabolic panel with GFR  Daily,   R     Question:  Specimen collection method  Answer:  Lab=Lab collect   06/11/24 0721   06/12/24 0500  CBC  Daily,   R     Question:  Specimen collection method  Answer:  Lab=Lab collect   06/11/24 0721           Antimicrobials/Microbiology: Anti-infectives (From admission, onward)    Start     Dose/Rate Route Frequency Ordered Stop   06/14/24 1100  doxycycline  (VIBRAMYCIN ) 100 mg in sodium chloride  0.9 % 250 mL IVPB        100 mg 125 mL/hr over 120 Minutes Intravenous 2 times daily 06/14/24 1012 06/19/24 0959   06/14/24 1000  piperacillin-tazobactam (ZOSYN) IVPB 3.375 g        3.375 g 12.5 mL/hr over 240 Minutes Intravenous Every 8 hours 06/14/24 0819     06/11/24 1000  cefTRIAXone  (ROCEPHIN ) 2 g in sodium chloride  0.9 % 100 mL IVPB  Status:  Discontinued        2 g 200 mL/hr over 30 Minutes Intravenous Every 24 hours 06/11/24 0851 06/14/24 0819   06/11/24 0200  piperacillin-tazobactam (ZOSYN) IVPB 3.375 g  Status:  Discontinued        3.375 g 12.5 mL/hr over 240 Minutes Intravenous Every 8 hours 06/10/24 2129 06/11/24 0851   06/11/24 0130  ceFEPIme (MAXIPIME) 2 g in sodium chloride  0.9 %  100 mL IVPB  Status:  Discontinued        2 g 200 mL/hr over 30 Minutes Intravenous  Once 06/11/24 0033 06/11/24 0039   06/10/24 1915  azithromycin  (ZITHROMAX ) 500 mg in sodium chloride  0.9 % 250 mL IVPB        500 mg 250 mL/hr over 60 Minutes Intravenous  Once 06/10/24 1907 06/10/24 2043   06/10/24 1745  piperacillin-tazobactam (ZOSYN) IVPB 3.375 g  3.375 g 100 mL/hr over 30 Minutes Intravenous  Once 06/10/24 1738 06/10/24 1845         Component Value Date/Time   SDES  06/13/2024 0912    BLOOD LEFT ARM BOTTLES DRAWN AEROBIC AND ANAEROBIC Performed at [facility], [address], [place], [place] [address]    SDES  06/13/2024 [id number]    BLOOD RIGHT ARM BOTTLES DRAWN AEROBIC ONLY Performed at [facility], [address] [place], [place] [address]    SPECREQUEST  06/13/2024 0912    Blood Culture results may not be optimal due to an inadequate volume of blood received in culture bottles Performed at [facility], [address] [place], [place] [address]    SPECREQUEST  06/13/2024 0912    Blood Culture results may not be optimal due to an inadequate volume of blood received in culture bottles Performed at [facility], [address] [place], [place] [address]    CULT  06/13/2024 0912    NO GROWTH 2 DAYS Performed at [facility], [address] [place], [place] [address]    CULT  06/13/2024 0912    NO GROWTH 2 DAYS Performed at [facility], [address] [place], [place] [address]    REPTSTATUS PENDING 06/13/2024 0912   REPTSTATUS PENDING 06/13/2024 0912    Procedures: Procedure(s) (LRB): CYSTOURETEROSCOPY, WITH RETROGRADE PYELOGRAM AND STENT INSERTION (Right)   Mennie LAMY, MD [facility] 06/15/2024, 10:39 AM

## 2024-06-15 NOTE — Progress Notes (Signed)
   4 Days Post-Op Subjective: Patient sitting up in bed on 40L HHFL.  Son and daughter-in-law at bedside discussed the events of the weekend and engagement of palliative care  Objective: Vital signs in last 24 hours: Temp:  [98 F (36.7 C)-101.1 F (38.4 C)] 100.7 F (38.2 C) (10/20 0750) Pulse Rate:  [85-116] 109 (10/20 0744) Resp:  [21-44] 27 (10/20 0744) BP: (123-162)/(44-74) 160/58 (10/20 0700) SpO2:  [90 %-98 %] 94 % (10/20 0856) FiO2 (%):  [50 %-80 %] 80 % (10/20 0856)  Assessment/Plan: #right ureteral stone #lactic acidosis #sepsis  Significant leukocytosis is finally improving.  Patient afebrile, but 101.1 recorded yesterday afternoon.  UCx Klebsiella oxytoca (+). Ongoing broad ABX   Foley can be removed after patient has been afebrile for 24 hrs.  Definitive stone mgmt available on an outpt basis with Dr. Roseann in 2-4 weeks if she recovers.   Patient remains on a significant amount of oxygen support without improvement and still struggling.  Concern for eventual respiratory failure looms.  Palliative care has been engaged and will speak to the family today.  Urology will follow peripherally while assessing patient's progression and goals of care.   Intake/Output from previous day: 10/19 0701 - 10/20 0700 In: 1040.4 [I.V.:40; IV Piggyback:1000.4] Out: 1800 [Urine:1800]  Intake/Output this shift: Total I/O In: -  Out: 950 [Urine:950]  Physical Exam:  General: Critically ill CV: No cyanosis Lungs: equal chest rise Gu: Foley catheter removed 10/18  Lab Results: Recent Labs    06/13/24 0250 06/14/24 0255 06/15/24 0500  HGB 11.5* 10.7* 10.3*  HCT 36.3 32.3* 31.8*   BMET Recent Labs    06/14/24 0255 06/15/24 0500  NA 144 145  K 3.1* 3.3*  CL 111 110  CO2 25 28  GLUCOSE 148* 132*  BUN 27* 23  CREATININE 0.51 0.51  CALCIUM 8.9 8.6*  HGB 10.7* 10.3*  WBC 34.1* 18.8*     Studies/Results: US  EKG SITE RITE Result Date: 06/14/2024 If Site  Rite image not attached, placement could not be confirmed due to current cardiac rhythm.  US  EKG SITE RITE Result Date: 06/14/2024 If Site Rite image not attached, placement could not be confirmed due to current cardiac rhythm.  DG Chest Port 1 View Result Date: 06/14/2024 CLINICAL DATA:  Shortness of breath. EXAM: PORTABLE CHEST 1 VIEW COMPARISON:  06/11/2024 FINDINGS: Interval development of asymmetric relatively diffuse airspace disease in both lungs, right greater than left. Persistent left base collapse/consolidation. Small bilateral pleural effusions. The cardio pericardial silhouette is enlarged. No acute bony abnormality. Telemetry leads overlie the chest. IMPRESSION: 1. Interval development of asymmetric relatively diffuse airspace disease in both lungs, right greater than left. Imaging features could be compatible with asymmetric edema or diffuse infection. 2. Persistent left base collapse/consolidation with small bilateral pleural effusions. Electronically Signed   By: Camellia Candle M.D.   On: 06/14/2024 08:16      LOS: 5 days   Ole Bourdon, NP [facility] Pager: [phone]  06/15/2024, 9:40 AM

## 2024-06-15 NOTE — Progress Notes (Signed)
 NAME:  Anna [name], MRN:  [id number], DOB:  21-Aug-1934, LOS: 5 ADMISSION DATE:  06/10/2024, CONSULTATION DATE:  06/11/24 REFERRING MD:  Dr. Christobal, CHIEF COMPLAINT:  rising lactic   History of Present Illness:   33 yoF with PMH as below significant for asthma, CKD III, and obstructive kidney stones who presented 10/15 with right back pain, fever, confusion, fatigue, and nausea since Wednesday am presented found to have severe sepsis due to right obstructive ureteral calculus with severe hydronephrosis with initial lactic acidosis and hypotension which improved with IV fluids, abx, and midodrine.  Underwent cystoscopy with right ureteral stent placement 10/16 am with urology.  BC showing GNR with Klebseilla oxytoca.  Broad spectrum abx narrowed to ceftriaxone .  Cardiology consulted for elevated troponin felt secondary to demand ischemia.  Worsening SOB throughout the day but stable O2 requirements on 2L.  Lactic trending up, 6> 5.4> 5.1>5.7, and concern swelling around lips and tongue.  Solumedrol x 1 given.  CXR showing bibasilar atelectasis and chronic interstitial changes.  BP remains stable and afebrile throughout the day.  Pt currently denies any pain including CP, nausea, or obvious feeling of tongue or lip swelling.  Feels confused.  C/o of worsening SOB with tachypnea this afternoon and feels confused, can't get her words out.  Remains otherwise non focal neurologically.  Has felt more congestion, no cough, since having COVID 4 weeks ago and reports ongoing allergies.  SARS/ flu/ RSV was negative on admit.  PCCM consulted for further evaluation.   Pertinent  Medical History   Past Medical History:  Diagnosis Date   Allergy    seasonal also with dogs and cats   Arthritis    Asthma    hx of in childhood    Bladder infection    hx of    Cancer (HCC)    breast cancer bilat has had double mastectomy    Chronic kidney disease    hx of kidney stones last one pasted 1 month ago    COVID     Fibrocystic breast disease    H/O blood clots    during delivery occurred 50 years ago per left arm    PONV (postoperative nausea and vomiting)    pt states stays very sleepy for days following anesthesia    Significant Hospital Events: Including procedures, antibiotic start and stop dates in addition to other pertinent events   10/16 admitted, s/p ureteral stent, cards/ PCCM consult 10/17 was on [facility]  10/18 transitioned to [facility] canula  Interim History / Subjective:   Worsening oxygenation.  Worsening subjective dyspnea, respiratory rate, tachypnea.  Goals of care discussion had with assistance of palliative care.  Objective    Blood pressure (!) 160/58, pulse (!) 109, temperature (!) 101.5 F (38.6 C), temperature source Axillary, resp. rate (!) 27, height 4' 11 (1.499 m), weight 58.4 kg, SpO2 94%.    FiO2 (%):  [50 %-80 %] 80 %   Intake/Output Summary (Last 24 hours) at 06/15/2024 1338 Last data filed at 06/15/2024 0910 Gross per 24 hour  Intake 511.59 ml  Output 1500 ml  Net -988.41 ml   Filed Weights   06/10/24 2337  Weight: 58.4 kg   Examination: General:  elderly acute on chronically ill-appearing HEENT: AT [place] Neuro: Awake alert CV: Tachycardic PULM: Increased work of breathing, tachypneic Skin: no rashes  Labs and imaging reviewed  Resolved problem list   Assessment and Plan   Severe sepsis secondary to right obstructive ureteral calculus s/p  stent placement Klebsiella oxytoca bacteremia  - Continue Zosyn escalated to 10/19  Hypoxemic Respiratory Failure Aspiration Pneumonia vs Hospital Acquired Pneumonia - Multifactoral related to suspicion for recurrent pneumonia as well as possible underlying ILD based on chronic changes on imaging albeit mild, clinically worsening despite aggressive therapies -- Continue broad-spectrum antibiotics, continue aggressive IV Lasix  Elevated troponin with suspected demand ischemia - Echocardiogram with valvular  dysfunction, high filling pressures on both sides, no focal wall motion abnormalities, no further workup at this time  Anemia Thrombocytopenia - Related to sepsis, chronic illness, continue to monitor with daily CBC  Acute Metabolic encephalopathy and delirium - Resolved, alert and oriented  GOC - Appreciate palliative care assistance, see IPAL note  Labs   CBC: Recent Labs  Lab 06/10/24 1743 06/11/24 0314 06/12/24 0309 06/13/24 0250 06/14/24 0255 06/15/24 0500  WBC 3.5* 14.3* 34.0* 35.8* 34.1* 18.8*  NEUTROABS 3.2  --   --  33.4*  --   --   HGB 13.9 10.6* 12.0 11.5* 10.7* 10.3*  HCT 43.1 35.6* 38.3 36.3 32.3* 31.8*  MCV 91.9 97.5 93.0 92.6 90.5 89.6  PLT 180 72* 76* 78* 91* 100*    Basic Metabolic Panel: Recent Labs  Lab 06/11/24 1821 06/12/24 0309 06/13/24 0250 06/14/24 0255 06/15/24 0500  NA 141 142 144 144 145  K 4.1 4.4 3.7 3.1* 3.3*  CL 110 110 111 111 110  CO2 18* 20* 24 25 28   GLUCOSE 101* 102* 99 148* 132*  BUN 20 23 33* 27* 23  CREATININE 0.91 1.00 0.78 0.51 0.51  CALCIUM 8.4* 8.7* 9.0 8.9 8.6*   GFR: Estimated Creatinine Clearance: 38.6 mL/min (by C-G formula based on SCr of 0.51 mg/dL). Recent Labs  Lab 06/11/24 0754 06/11/24 1259 06/11/24 1821 06/12/24 0309 06/12/24 0607 06/13/24 0250 06/14/24 0255 06/15/24 0500  WBC  --   --   --  34.0*  --  35.8* 34.1* 18.8*  LATICACIDVEN 5.1* 5.7* 3.8*  --  2.3*  --   --   --     Liver Function Tests: Recent Labs  Lab 06/10/24 1743 06/11/24 0314  AST 31 61*  ALT 18 26  ALKPHOS 110 109  BILITOT 0.8 0.6  PROT 6.5 4.3*  ALBUMIN 3.9 2.6*   No results for input(s): LIPASE, AMYLASE in the last 168 hours. No results for input(s): AMMONIA in the last 168 hours.  ABG No results found for: PHART, PCO2ART, PO2ART, HCO3, TCO2, ACIDBASEDEF, O2SAT   Coagulation Profile: Recent Labs  Lab 06/10/24 1743 06/11/24 0314  INR 1.0 1.6*    Cardiac Enzymes: No results for input(s):  CKTOTAL, CKMB, CKMBINDEX, TROPONINI in the last 168 hours.  HbA1C: Hgb A1c MFr Bld  Date/Time Value Ref Range Status  03/21/2022 01:59 AM 5.2 4.8 - 5.6 % Final    Comment:    (NOTE) Pre diabetes:          5.7%-6.4%  Diabetes:              >6.4%  Glycemic control for   <7.0% adults with diabetes     CBG: No results for input(s): GLUCAP in the last 168 hours. Allergies Allergies  Allergen Reactions   Demerol [Meperidine] Nausea And Vomiting   Sulfa Antibiotics Nausea And Vomiting    Critical care time:     CRITICAL CARE N/a  Donnice JONELLE Beals, MD [facility] Office: [phone]    See Amion for contact info PCCM on call pager [phone] until  7pm. Please call Elink 7p-7a. [phone]

## 2024-06-15 NOTE — Consult Note (Signed)
 Palliative Medicine Inpatient Consult Note  Consulting Provider:  Christobal Guadalajara, MD   Reason for consult:   Palliative Care Consult Services Palliative Medicine Consult  Reason for Consult? GOC   06/15/2024  HPI:  Per intake H&P --> Anna [name] is a 89 y.o. female with PMH of obstructive kidney stones, asthma, history of breast cancer, seasonal allergies, chronic kidney disease stage III, osteoarthritis, presents with right back pain, dysuria and generalized fatigue nausea without gross hematuria. Palliative care has been asked to support additional goals of care conversations.   Clinical Assessment/Goals of Care:  *Please note that this is a verbal dictation therefore any spelling or grammatical errors are due to the Dragon Medical One system interpretation.  I have reviewed medical records including EPIC notes, labs and imaging, received report from bedside RN, assessed the patient who is lying in bed alert and awake, noted to be on HHFNC at 45LPM/FiO2 80%.    I met with Anna [name], her son, Anna [name], her daughter, Anna [name], her son in law, Anna [name], her granddaughters, Anna [name], and her great grandson, Anna [name]  to further discuss diagnosis prognosis, GOC, EOL wishes, disposition and options.   I introduced Palliative Medicine as specialized medical care for people living with serious illness. It focuses on providing relief from the symptoms and stress of a serious illness. The goal is to improve quality of life for both the patient and the family.  Medical History Review and Understanding:  A review of [name]'s past medical history significant for Kidney stones (obstructive), asthma, COVID-19, CKD III, OA, & seasonal allergies.  Social History:  [name] is from [place], [place] . She is a widow. She has two children. 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 She is a very faithful woman who takes great pride in her [place] activities. She loves  plants and has a green thumb. She use to enjoy sewing and crocheting.   Functional and Nutritional State:  Prior to hospitalization, [name] was independent and lived with her son, Anna [name]. She did use a can due to her scoliosis though was able to complete bADL's although slowly. There had been no significant nutritional deviations preceding hospitalization.   Advance Directives:  A detailed discussion was had today regarding advanced directives.  Patient is able to make decisions for herself. There are no established AD's on file.   Code Status:  Concepts specific to code status, artifical feeding and hydration, continued IV antibiotics and rehospitalization was had.  The difference between a aggressive medical intervention path  and a palliative comfort care path for this patient at this time was had.   [name] is a DNAR/DNI which has been confirmed.   Discussion:  We discussed the reason(s) leading to hospitalization inclusive of pain in the back from a kidney stone leading to sepsis. We reviewed that [name] has been hospitalized for four days now. She has had improvement in her urinary symptoms though tremendous worsening of her respiratory state. She has gradually required increased O2 and has graduated to the point of needing bipap during the evening hours and HHFN during the day.   Dr. Annella was able to provide updates and a synapsis of patients current clinical state. He shares that he worries her overall trajectory is not improving. Options such as continuing present measures for a time trial of treatment(s) versus comfort measures were broached. [name] is clear that she does not desire to live like this.  We talked about transition to comfort measures in  house and what that would entail inclusive of medications to control pain, dyspnea, agitation, nausea, itching, and hiccups.  We discussed stopping all uneccessary measures such as cardiac monitoring, blood draws, needle sticks, and  frequent vital signs.   We discussed the importance of allowing time for friends and family to visit. We discussed additionally allowing for [name] to speak to her family regarding the decisions she would like to make on her own behalf. She does note COVID infection a few weeks ago. She notes that this really took it out of her. More so than on the two previous occasions she had it. We discussed that this may be factoring into her poor condition at this time most especially related to her respiratory state.   Patients family met me outside the room after our meeting and shares that [name] has requested getting her sibling out to see her. After this is done she will determine the next steps. In the meanwhile should symptoms develop we will treat them accordingly to prevent air hungry and suffrage.   Discussed the importance of continued conversation with family and their  medical providers regarding overall plan of care and treatment options, ensuring decisions are within the context of the patients values and GOCs.  Decision Maker: Anna [name],Anna (Daughter): [phone] (Home Phone)   SUMMARY OF RECOMMENDATIONS   DNAR/DNI  Open and honest conversations held in the setting of [name]'s current clinical condition  Appreciate CCM providing patients family with an update  Plan for family to spend time with [name] - she has requested her siblings visit  Discussed options of time limited trial of treatments versus comfort care   Patient to determine the best path for herself in the oncoming day(s)  Ongoing PMT support  Code Status/Advance Care Planning: DNAR/DNI  Palliative Prophylaxis:  Aspiration, Bowel Regimen, Delirium Protocol, Frequent Pain Assessment, Oral Care, Palliative Wound Care, and Turn Reposition  Additional Recommendations (Limitations, Scope, Preferences): Continue current care  Psycho-social/Spiritual:  Desire for further Chaplaincy support: Patient's own pastor will  visit Additional Recommendations: Education on disease burden and severity of respiratory failure   Prognosis: Limited at this time  Discharge Planning: To be determined.   Vitals:   06/15/24 0400 06/15/24 0500  BP: (!) 141/57 (!) 144/49  Pulse: 100 93  Resp: (!) 35 (!) 34  Temp: 98 F (36.7 C)   SpO2: 92% 92%    Intake/Output Summary (Last 24 hours) at 06/15/2024 0714 Last data filed at 06/15/2024 0512 Gross per 24 hour  Intake 1040.38 ml  Output 1800 ml  Net -759.62 ml   Last Weight  Most recent update: 06/10/2024 11:43 PM    Weight  58.4 kg (128 lb 12 oz)             LABS: CBC:    Component Value Date/Time   WBC 18.8 (H) 06/15/2024 0500   HGB 10.3 (L) 06/15/2024 0500   HCT 31.8 (L) 06/15/2024 0500   PLT 100 (L) 06/15/2024 0500   MCV 89.6 06/15/2024 0500   NEUTROABS 33.4 (H) 06/13/2024 0250   LYMPHSABS 1.6 06/13/2024 0250   MONOABS 0.7 06/13/2024 0250   EOSABS 0.0 06/13/2024 0250   BASOSABS 0.0 06/13/2024 0250   Comprehensive Metabolic Panel:    Component Value Date/Time   NA 145 06/15/2024 0500   K 3.3 (L) 06/15/2024 0500   CL 110 06/15/2024 0500   CO2 28 06/15/2024 0500   BUN 23 06/15/2024 0500   CREATININE 0.51 06/15/2024 0500   GLUCOSE 132 (  H) 06/15/2024 0500   CALCIUM 8.6 (L) 06/15/2024 0500   AST 61 (H) 06/11/2024 0314   ALT 26 06/11/2024 0314   ALKPHOS 109 06/11/2024 0314   BILITOT 0.6 06/11/2024 0314   PROT 4.3 (L) 06/11/2024 0314   ALBUMIN 2.6 (L) 06/11/2024 0314   Gen:  Elderly Caucasian F chronically ill in appearance HEENT: Dry mucous membranes CV: Irregular rate and rhythm  PULM:  On HHFNC 45LPM/80% FiO2 breathing is labored ABD: soft/nontender  EXT: No edema  Neuro: Alert and oriented x3   PPS: 20%   This conversation/these recommendations were discussed with patient primary care team, Dr. Christobal ______________________________________________________ Rosaline Becton [facility] Palliative Medicine Team Team Cell Phone:  [phone] Please utilize secure chat with additional questions, if there is no response within 30 minutes please call the above phone number  Total Time: 90 Billing based on MDM: High  Palliative Medicine Team providers are available by phone from 7am to 7pm daily and can be reached through the team cell phone.  Should this patient require assistance outside of these hours, please call the patient's attending physician.

## 2024-06-15 NOTE — Progress Notes (Incomplete)
 Initial Nutrition Assessment  DOCUMENTATION CODES:      INTERVENTION:  ***   NUTRITION DIAGNOSIS:     related to   as evidenced by  .  ***  GOAL:      ***  MONITOR:      REASON FOR ASSESSMENT:   Consult Assessment of nutrition requirement/status  ASSESSMENT:   89 yo female with PMH significant for asthma, CKD III, and obstructive kidney stones who presented with right back pain, fever, confusion, fatigue, and nausea. Admitted for severe sepsis due to right obstructive ureteral calculus with severe hydronephrosis, acute respiratory failure, and concern for fluid overload.   ***   Medications reviewed and include: Lasix  Labs reviewed:  K+ 3.3   NUTRITION - FOCUSED PHYSICAL EXAM:  {RD Focused Exam List:21252}  Diet Order:   Diet Order             Diet regular Fluid consistency: Thin  Diet effective now                   EDUCATION NEEDS:     Skin:  Skin Assessment: Reviewed RN Assessment  Last BM:  10/20  Height:  Ht Readings from Last 1 Encounters:  06/10/24 4' 11 (1.499 m)   Weight:  Wt Readings from Last 1 Encounters:  06/10/24 58.4 kg   Ideal Body Weight:     BMI:  Body mass index is 26 kg/m.  Estimated Nutritional Needs:  Kcal:    Protein:    Fluid:       Trude Ned RD, LDN Contact via Secure Chat.

## 2024-06-16 DIAGNOSIS — A419 Sepsis, unspecified organism: Secondary | ICD-10-CM | POA: Diagnosis not present

## 2024-06-16 DIAGNOSIS — R0603 Acute respiratory distress: Secondary | ICD-10-CM | POA: Diagnosis not present

## 2024-06-16 DIAGNOSIS — Z515 Encounter for palliative care: Secondary | ICD-10-CM | POA: Diagnosis not present

## 2024-06-16 DIAGNOSIS — R6521 Severe sepsis with septic shock: Secondary | ICD-10-CM | POA: Diagnosis not present

## 2024-06-16 DIAGNOSIS — Z7189 Other specified counseling: Secondary | ICD-10-CM | POA: Diagnosis not present

## 2024-06-16 MED ORDER — GLYCOPYRROLATE 0.2 MG/ML IJ SOLN: 0.2000 mg | INTRAMUSCULAR | Status: DC | PRN

## 2024-06-16 MED ORDER — POLYVINYL ALCOHOL 1.4 % OP SOLN: 1.0000 [drp] | Freq: Four times a day (QID) | OPHTHALMIC | Status: DC | PRN

## 2024-06-16 MED ORDER — GLYCOPYRROLATE 1 MG PO TABS: 1.0000 mg | ORAL_TABLET | ORAL | Status: DC | PRN

## 2024-06-16 MED ORDER — MORPHINE BOLUS VIA INFUSION
2.0000 mg | INTRAVENOUS | Status: DC | PRN
  Administered 2024-06-16 (×4): 4 mg via INTRAVENOUS
  Administered 2024-06-16: 2 mg via INTRAVENOUS
  Administered 2024-06-16: 4 mg via INTRAVENOUS
  Administered 2024-06-16: 2 mg via INTRAVENOUS

## 2024-06-16 MED ORDER — MORPHINE SULFATE (PF) 4 MG/ML IV SOLN
4.0000 mg | Freq: Once | INTRAVENOUS | Status: AC
  Administered 2024-06-16: 4 mg via INTRAVENOUS
  Filled 2024-06-16: qty 1

## 2024-06-16 MED ORDER — BIOTENE DRY MOUTH MT LIQD: 15.0000 mL | OROMUCOSAL | Status: DC | PRN

## 2024-06-16 MED ORDER — MORPHINE 100MG IN NS 100ML (1MG/ML) PREMIX INFUSION
2.0000 mg/h | INTRAVENOUS | Status: DC
  Administered 2024-06-16: 2 mg/h via INTRAVENOUS
  Filled 2024-06-16: qty 100

## 2024-06-16 MED ORDER — LORAZEPAM 2 MG/ML IJ SOLN: 1.0000 mg | INTRAMUSCULAR | Status: DC | PRN

## 2024-06-16 MED ORDER — LORAZEPAM 2 MG/ML IJ SOLN
1.0000 mg | Freq: Four times a day (QID) | INTRAMUSCULAR | Status: DC
  Administered 2024-06-16 (×2): 1 mg via INTRAVENOUS
  Filled 2024-06-16 (×2): qty 1

## 2024-06-18 LAB — CULTURE, BLOOD (ROUTINE X 2)
Culture: NO GROWTH
Culture: NO GROWTH

## 2024-06-19 ENCOUNTER — Ambulatory Visit: Admitting: Family Medicine

## 2024-06-24 ENCOUNTER — Telehealth: Payer: Self-pay | Admitting: Family Medicine

## 2024-06-24 NOTE — Telephone Encounter (Signed)
 Provider aware.  Dm/cma

## 2024-06-24 NOTE — Telephone Encounter (Signed)
 Copied from CRM [id number]. Topic: General - Deceased Patient >> 2024/07/24  9:58 AM Ashley SAUNDERS wrote: Name of caller: Anna [name]  Date of death: July 16, 2024

## 2024-06-25 ENCOUNTER — Ambulatory Visit: Admitting: Family Medicine

## 2024-06-27 NOTE — Progress Notes (Signed)
    OVERNIGHT PROGRESS REPORT  Notified by RN that patient is deceased as of 1922 Hrs.  Patient was DNR/CMO (comfort measures only) followed by Palliative.  2 RN verified.  Family was available to RN.    Lynwood Kipper MSNA MSN ACNPC-AG Acute Care Nurse Practitioner [facility]

## 2024-06-27 NOTE — Progress Notes (Signed)
 Physical Therapy Discharge Patient Details Name: Anna [name] MRN: [id number] DOB: 1934/02/28 Today's Date: 07/01/24 Time:  -     Patient discharged from PT services secondary to transition to Comfort care.  [name] PT Acute Rehabilitation Services Office [phone]     Potters Darice Norris Jul 01, 2024, 10:25 AM

## 2024-06-27 NOTE — Progress Notes (Signed)
 Palliative Medicine Inpatient Follow Up Note HPI: Anna [name] is a 89 y.o. female with PMH of obstructive kidney stones, asthma, history of breast cancer, seasonal allergies, chronic kidney disease stage III, osteoarthritis, presents with right back pain, dysuria and generalized fatigue nausea without gross hematuria. Palliative care has been asked to support additional goals of care conversations.   Today's Discussion 07/06/2024  *Please note that this is a verbal dictation therefore any spelling or grammatical errors are due to the Dragon Medical One system interpretation.  Chart reviewed inclusive of vital signs, progress notes, laboratory results, and diagnostic images.   I met with [name] this morning in the company of her son, [name], her daughter, [name], her son in law, Gene. Nursing staff assisted in placing her hearing aids.   Elvin shares with me that she had decided to transition the focus of her care to comfort measures overnight. We discussed that this entails, the medications which are stopped and continued as a result of this.   We reviewed often after all family and friends have spent time with a person we slowly wean patients off of oxygen support. We utilize medication(s) during that time inclusive of morphine to help with work of breathing. I shared given the amount of oxygen [name] is current requiring I would strongly recommend a morphine gtt is initiated to avoid distress. She and hr family are agreeable to this. I shared the advantage of this is that it will support [name]'s work of breathing though she may also get more sleepy. She is aware and agreeable to this plan.   Nursing staff and I reviewed giving [name] morphine 4mg  IVP, placing her on the gtt with an hourly rate, and weaning her down in O2.   I shared with patients family outside of the room that I anticipate time is likely to be limited.   Created space and opportunity for patients family to explore  thoughts feelings and fears regarding current medical situation.They note understanding. Patient SIL shares he has been through this before.  Emotional support was provided.   Questions and concerns addressed.  Objective Assessment: Vital Signs Vitals:   06-Jul-2024 0304 07-06-24 0400  BP:    Pulse: 97 (!) 104  Resp: (!) 31 (!) 30  Temp:    SpO2: 91% 91%    Intake/Output Summary (Last 24 hours) at Jul 06, 2024 9070 Last data filed at July 06, 2024 0000 Gross per 24 hour  Intake 488.92 ml  Output 1300 ml  Net -811.08 ml   Last Weight  Most recent update: 06/10/2024 11:43 PM    Weight  58.4 kg (128 lb 12 oz)            Gen:  Elderly Caucasian F chronically ill in appearance HEENT: Dry mucous membranes CV: Irregular rate and rhythm  PULM:  On HHFNC 45LPM/80% FiO2 breathing is labored ABD: soft/nontender  EXT: No edema  Neuro: Alert and oriented x3   SUMMARY OF RECOMMENDATIONS   DNAR/DNI  Comfort care  Initiate morphine gtt with boluses PRN  Wean to RA over time  Ativan 1mg  IVP Q6H and PRN  Additional comfort medications per [facility]  Unrestricted family visitation  Anticipate in hospital death  Emotional support provided  The PMT will continue to follow along ______________________________________________________________________________________ Rosaline [facility] Palliative Medicine Team Team Cell Phone: [phone] Please utilize secure chat with additional questions, if there is no response within 30 minutes please call the above phone number  Time Spent: 65 Billing based on  MDM: High  Palliative Medicine Team providers are available by phone from 7am to 7pm daily and can be reached through the team cell phone.  Should this patient require assistance outside of these hours, please call the patient's attending physician.

## 2024-06-27 NOTE — Death Summary Note (Signed)
 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 female with PMH of obstructive kidney stones, asthma, history of breast cancer, seasonal allergies, chronic kidney disease stage III, osteoarthritis, presents with right back pain, dysuria and generalized fatigue nausea without gross hematuria. In the ED found to have severe sepsis with hypotension, tachycardia lactic acidosis. Patient was resuscitated with IV fluids 2 L, antibiotics. Labs- troponin 29 >158,  la 4.8>4>6, UA leukocyte esterase WBC 6-10 CT chest and pelvis with contrast >>6 mm distal right ureteral calculus,severe right hydroureteronephrosis. 2. 5 mm nonobstructing right lower pole renal calculus.3. No acute findings in the chest EDP discussed with cardiology reviewed EKG felt demand ischemia Urology consulted and transferred to [facility] urgently for ureteral stent placement. S/p cystoscopy right ureteral stent Postprocedure hypotensive lethargic worsening lactic acidosis-received  albumin 25 g, midodrine x 2 and 1 l bolus IVF PCCM was consulted placed on HHFNC.  Patient having acute metabolic encephalopathy in the setting of sepsis Respiratory status has been tenuous, patient has been placed on BiPAP 10/19 palliative care consulted for goals of  care Patient was transition to comfort measures for end-of-life care with worsening respiratory status and ongoing altered mental status Patient passed a peacefully with family at bedside on June 23, 2024   Assessment and plan:  End-of-life care: Morphine drip and other comforting medication was initiated  Severe sepsis/septic shock with lactate more than 4 due to obstructive right  ureteral calculus 6 mm w/ severe right hydronephrosis Blood cultures 4/4 Klebsiella oxytoca Klebsiella oxytoca UTI: S/p cystoscopy with right ureteral stent,aggressive IVF, s/p albumin Lactic acidosis trended down,leukocytosis improving, and BP remains stable-having tenuous respiratory status  Blood culture 4/4 Klebsiella oxytoca, urine culture similar bacteria-sensitive to Rocephin > changed to Zosyn 10/19 due to respiratory status PCCM and urology following.   Acute respiratory failure with hypoxia Pneumonia left base-concerning for aspiration pneumonia: In the setting of severe sepsis> with respiratory failure likely multifactorial, also concern for fluid overload and pneumonia Patient receiving BiPAP along with supplemental oxygen and Lasix Now on EOL care  Concern for fluid overload Acute diastolic CHF : Echo this admission with normal EF, G1 DD, proBNP elevated 10,288, s/p IV Lasix  Elevated troponin from demand ischemia due to sepsis: echo reassuring with no WMA , treat underlying etiology.  Cardio input appreciated  Acute metabolic encephalopathy Deconditioning debility At risk for malnutrition: Patient has been confused in the setting of sepsis hypoxia, remains confused and in distress,    AKI: resolved.    Hypokalemia: Replaced  Anemia of critical illness: Hemoglobin stable  Leukocytosis: In the setting of sepsis  Thrombocytopenia: Due to sepsis,improved to 100  Scoliosis/Osteoarthritis of multiple joints: Supportive care pain management  Squamous cell carcinoma of skin of temple  region: seeing derm q 71mo and under control  Deconditioning/debility Recent  falls Goals of care Poor oral intake ambulates with cane and recent covid 6 wk and deconditinoned and slow since then.Husband died from aspiration pneumonia related to Parkinson disease Patient with tenuous respiratory status, prognosis remains guarded, with ongoing acute metabolic encephalopathy, hypoxia.  Patient and family at this time has decided to transition to comfort measures  DVT prophylaxis:  Code Status:   Code Status: Do not attempt resuscitation (DNR) - Comfort care Family Communication: plan of care discussed w/ family Patient status is: Remains hospitalized because of severity of illness Level of care: Stepdown  as she is critically ill  Dispo: The patient is from: HOME with her son            Anticipated disposition: hospice Objective: Vitals last 24 hrs: Vitals:   06-20-24 0250 20-Jun-2024 0300 2024-06-20 0304 06/20/24 0400  BP:      Pulse: 90 100 97 (!) 104  Resp: (!) 28 (!) 31 (!) 31 (!) 30  Temp:      TempSrc:      SpO2: 94% 95% 91% 91%  Weight:      Height:       Physical Examination: Not done   Medications reviewed:  Scheduled Meds:  artificial tears  1 drop Both Eyes BID   azelastine   1 spray Each Nare BID   ipratropium-albuterol  3 mL Nebulization Q4H   latanoprost   1 drop Both Eyes QPM   LORazepam  1 mg Intravenous Q6H   Continuous Infusions:  morphine 3 mg/hr (06-20-24 0952)   Diet: Diet Order             Diet regular Fluid consistency: Thin  Diet effective now                    Assessment and Plan: No notes have been filed under this hospital service. Service: Hospitalist        Procedures: s/p stent  Consultations:  Pmt Pccm urology  The results of significant diagnostics from this hospitalization (including imaging, microbiology, ancillary and laboratory) are listed below for reference.   Significant Diagnostic Studies: US  EKG SITE  RITE Result Date: 06/14/2024 If Site Rite image not attached, placement could not be confirmed due to current cardiac rhythm.  US  EKG SITE RITE Result Date: 06/14/2024 If Site Rite image not attached, placement could not be confirmed due to current cardiac rhythm.  DG Chest Port 1 View Result Date: 06/14/2024 CLINICAL DATA:  Shortness of breath. EXAM: PORTABLE CHEST 1 VIEW COMPARISON:  06/11/2024 FINDINGS: Interval development of asymmetric relatively diffuse airspace disease in both lungs, right greater than left. Persistent left base collapse/consolidation. Small bilateral pleural effusions. The cardio pericardial silhouette is enlarged. No acute bony abnormality. Telemetry leads overlie the chest. IMPRESSION: 1. Interval development of asymmetric relatively diffuse airspace disease in both lungs, right greater than left. Imaging features could be compatible with asymmetric edema or diffuse infection. 2. Persistent left base collapse/consolidation with small bilateral pleural effusions. Electronically Signed   By: Camellia Candle M.D.   On: 06/14/2024 08:16   ECHOCARDIOGRAM COMPLETE Result Date: 06/11/2024    ECHOCARDIOGRAM REPORT   Patient Name:   SHANDEE JERGENS Date of Exam: 06/11/2024 Medical Rec #:  [id number]        Height:       59.0 in Accession #:    [id number]       Weight:       128.7 lb Date of Birth:  [birth year]  BSA:          1.529 m Patient Age:    89 years         BP:           117/43 mmHg Patient Gender: F                HR:           95 bpm. Exam Location:  Inpatient Procedure: 2D Echo, Cardiac Doppler and Color Doppler (Both Spectral and Color            Flow Doppler were utilized during procedure). STAT ECHO Indications:    CHF I50.9  History:        Patient has prior history of Echocardiogram examinations, most                 recent 03/21/2022. CHF; Signs/Symptoms:Shortness of Breath. H/O                 Breast cancer.  Sonographer:    BERNARDA ROCKS Referring Phys: [id number]  MAHESH A CHANDRASEKHAR IMPRESSIONS  1. Left ventricular ejection fraction, by estimation, is 60 to 65%. The left ventricle has normal function. The left ventricle has no regional wall motion abnormalities. Left ventricular diastolic parameters are consistent with Grade I diastolic dysfunction (impaired relaxation). Elevated left ventricular end-diastolic pressure.  2. Right ventricular systolic function is normal. The right ventricular size is normal. There is moderately elevated pulmonary artery systolic pressure.  3. Left atrial size was mildly dilated.  4. The mitral valve is degenerative. Mild mitral valve regurgitation. Mild to moderate mitral stenosis. The mean mitral valve gradient is 6.0 mmHg.  5. The aortic valve is normal in structure. Aortic valve regurgitation is mild. No aortic stenosis is present.  6. The inferior vena cava is dilated in size with <50% respiratory variability, suggesting right atrial pressure of 15 mmHg. FINDINGS  Left Ventricle: Left ventricular ejection fraction, by estimation, is 60 to 65%. The left ventricle has normal function. The left ventricle has no regional wall motion abnormalities. The left ventricular internal cavity size was normal in size. There is  no left ventricular hypertrophy. Left ventricular diastolic parameters are consistent with Grade I diastolic dysfunction (impaired relaxation). Elevated left ventricular end-diastolic pressure. Right Ventricle: The right ventricular size is normal. No increase in right ventricular wall thickness. Right ventricular systolic function is normal. There is moderately elevated pulmonary artery systolic pressure. The tricuspid regurgitant velocity is 3.12 m/s, and with an assumed right atrial pressure of 15 mmHg, the estimated right ventricular systolic pressure is 53.9 mmHg. Left Atrium: Left atrial size was mildly dilated. Right Atrium: Right atrial size was normal in size. Pericardium: Trivial pericardial effusion is present. The  pericardial effusion is circumferential. Mitral Valve: The mitral valve is degenerative in appearance. Mild to moderate mitral annular calcification. Mild mitral valve regurgitation. Mild to moderate mitral valve stenosis. MV peak gradient, 13.5 mmHg. The mean mitral valve gradient is 6.0 mmHg. Tricuspid Valve: The tricuspid valve is normal in structure. Tricuspid valve regurgitation is mild . No evidence of tricuspid stenosis. Aortic Valve: The aortic valve is normal in structure. Aortic valve regurgitation is mild. Aortic regurgitation PHT measures 534 msec. No aortic stenosis is present. Aortic valve mean gradient measures 3.0 mmHg. Aortic valve peak gradient measures 6.6 mmHg. Aortic valve area, by VTI measures 3.01 cm. Pulmonic Valve: The pulmonic valve was not well visualized. Pulmonic valve regurgitation is trivial. No evidence of pulmonic stenosis. Aorta: The aortic root  is normal in size and structure. Venous: The inferior vena cava is dilated in size with less than 50% respiratory variability, suggesting right atrial pressure of 15 mmHg. IAS/Shunts: No atrial level shunt detected by color flow Doppler.  LEFT VENTRICLE PLAX 2D LVIDd:         4.60 cm      Diastology LVIDs:         3.10 cm      LV e' medial:    3.70 cm/s LV PW:         0.90 cm      LV E/e' medial:  37.3 LV IVS:        0.90 cm      LV e' lateral:   6.31 cm/s LVOT diam:     1.90 cm      LV E/e' lateral: 21.9 LV SV:         73 LV SV Index:   48 LVOT Area:     2.84 cm  LV Volumes (MOD) LV vol d, MOD A2C: 104.0 ml LV vol d, MOD A4C: 101.0 ml LV vol s, MOD A2C: 38.5 ml LV vol s, MOD A4C: 39.7 ml LV SV MOD A2C:     65.5 ml LV SV MOD A4C:     101.0 ml LV SV MOD BP:      63.7 ml RIGHT VENTRICLE             IVC RV Basal diam:  3.40 cm     IVC diam: 2.40 cm RV S prime:     11.90 cm/s TAPSE (M-mode): 2.3 cm RVSP:           53.9 mmHg LEFT ATRIUM             Index        RIGHT ATRIUM            Index LA diam:        3.60 cm 2.35 cm/m   RA Pressure:  15.00 mmHg LA Vol (A2C):   58.6 ml 38.31 ml/m  RA Area:     10.60 cm LA Vol (A4C):   46.1 ml 30.14 ml/m  RA Volume:   20.10 ml   13.14 ml/m LA Biplane Vol: 52.0 ml 34.00 ml/m  AORTIC VALVE                    PULMONIC VALVE AV Area (Vmax):    2.50 cm     PV Vmax:          0.91 m/s AV Area (Vmean):   2.69 cm     PV Peak grad:     3.3 mmHg AV Area (VTI):     3.01 cm     PR End Diast Vel: 4.73 msec AV Vmax:           128.00 cm/s AV Vmean:          78.500 cm/s AV VTI:            0.242 m AV Peak Grad:      6.6 mmHg AV Mean Grad:      3.0 mmHg LVOT Vmax:         113.00 cm/s LVOT Vmean:        74.500 cm/s LVOT VTI:          0.257 m LVOT/AV VTI ratio: 1.06 AI PHT:            534 msec  AORTA Ao Root diam:  3.00 cm Ao Asc diam:  3.20 cm MITRAL VALVE                TRICUSPID VALVE MV Area (PHT): 5.84 cm     TR Peak grad:   38.9 mmHg MV Area VTI:   2.21 cm     TR Vmax:        312.00 cm/s MV Peak grad:  13.5 mmHg    Estimated RAP:  15.00 mmHg MV Mean grad:  6.0 mmHg     RVSP:           53.9 mmHg MV Vmax:       1.84 m/s MV Vmean:      120.5 cm/s   SHUNTS MV Decel Time: 130 msec     Systemic VTI:  0.26 m MR Peak grad: 77.4 mmHg     Systemic Diam: 1.90 cm MR Vmax:      440.00 cm/s MV E velocity: 138.00 cm/s MV A velocity: 159.00 cm/s MV E/A ratio:  0.87 Kardie Tobb DO Electronically signed by Dub Huntsman DO Signature Date/Time: 06/11/2024/4:37:58 PM    Final    DG Chest Port 1 View Result Date: 06/11/2024 CLINICAL DATA:  Shortness of breath. EXAM: PORTABLE CHEST 1 VIEW COMPARISON:  06/10/2024 FINDINGS: Stable normal sized heart. Interval bibasilar linear densities, left greater than right. Stable mild chronic interstitial prominence. No pleural fluid. Tortuous and partially calcified thoracic aorta. Mild-to-moderate lower thoracic spine degenerative changes. IMPRESSION: 1. Interval bibasilar linear atelectasis, left greater than right. 2. Stable mild chronic interstitial lung disease. Electronically Signed   By: Elspeth Bathe M.D.   On: 06/11/2024 14:35   DG C-Arm 1-60 Min-No Report Result Date: 06/11/2024 Fluoroscopy was utilized by the requesting physician.  No radiographic interpretation.   CT CHEST ABDOMEN PELVIS W CONTRAST Result Date: 06/10/2024 EXAM: CT CHEST, ABDOMEN AND PELVIS WITH CONTRAST 06/10/2024 08:27:36 PM TECHNIQUE: CT of the chest, abdomen and pelvis was performed with the administration of 100 mL of iohexol  (OMNIPAQUE ) 300 MG/ML solution. Multiplanar reformatted images are provided for review. Automated exposure control, iterative reconstruction, and/or weight based adjustment of the mA/kV was utilized to reduce the radiation dose to as low as reasonably achievable. COMPARISON: CTA chest dated 03/20/2022. CLINICAL HISTORY: Sepsis; +SIRS, PNA vs UTI vs obstructed R stone. 89 y/o female that woke up this morning with right lower back pain, dysuria. Hx UTI. Hx kidney stone. FINDINGS: CHEST: MEDIASTINUM AND LYMPH NODES: Mitral valve calcifications. Thoracic aortic atherosclerosis. Mild 3 vessel coronary artery disease. The central airways are clear. No mediastinal, hilar or axillary lymphadenopathy. LUNGS AND PLEURA: Subpleural scarring in the lungs bilaterally, favoring postinfectious/inflammatory scarring. Stable nodular scarring in the right lung apex measuring up to 9 mm (image 26), benign given greater than 2 year stability. No follow-up is recommended per Fleischner society guidelines. No focal consolidation or pulmonary edema. No pleural effusion or pneumothorax. ABDOMEN AND PELVIS: LIVER: The liver is unremarkable. GALLBLADDER AND BILE DUCTS: Gallbladder is unremarkable. No biliary ductal dilatation. SPLEEN: No acute abnormality. PANCREAS: No acute abnormality. ADRENAL GLANDS: No acute abnormality. KIDNEYS, URETERS AND BLADDER: 6 mm distal right ureteral calculus just above the UVJ (image 95). Severe right hydroureteronephrosis. 5 mm nonobstructing right lower pole renal calculus (image 67). Left renal  sinus cysts, without hydronephrosis. Per consensus, no follow-up is needed for simple Bosniak type 1 and 2 renal cysts, unless the patient has a malignancy history or risk factors. No perinephric or periureteral stranding. Urinary bladder is unremarkable. GI AND  BOWEL: Moderate hiatal hernia. Stomach demonstrates no acute abnormality. There is no bowel obstruction. Sigmoid diverticulosis, without evidence of diverticulitis. REPRODUCTIVE ORGANS: Status post hysterectomy. PERITONEUM AND RETROPERITONEUM: No ascites. No free air. VASCULATURE: Aorta is normal in caliber. ABDOMINAL AND PELVIS LYMPH NODES: No lymphadenopathy. BONES AND SOFT TISSUES: Degenerative changes of the visualized thoracolumbar spine. Lumbar levoscoliosis. No focal soft tissue abnormality. IMPRESSION: 1. 6 mm distal right ureteral calculus just above the UVJ. Associated severe right hydroureteronephrosis. 2. 5 mm nonobstructing right lower pole renal calculus. 3. No acute findings in the chest. Electronically signed by: Pinkie Pebbles MD 06/10/2024 08:37 PM EDT RP Workstation: [id number]   DG Chest Portable 1 View Result Date: 06/10/2024 EXAM: 1 VIEW(S) XRAY OF THE CHEST 06/10/2024 06:40:49 PM COMPARISON: Chest x-ray 03/20/2022 and CT chest 03/20/2022. CLINICAL HISTORY: PNA. FINDINGS: LUNGS AND PLEURA: Diffuse interstitial opacities throughout the lungs. Bronchial wall thickening. No pulmonary edema. No pleural effusion. No pneumothorax. HEART AND MEDIASTINUM: Aortic atherosclerosis. No acute abnormality of the cardiac and mediastinal silhouettes. BONES AND SOFT TISSUES: No acute osseous abnormality. IMPRESSION: 1. Diffuse interstitial opacities throughout both lungs with bronchial wall thickening likely infectious or inflammatory. Electronically signed by: Norman Gatlin MD 06/10/2024 07:03 PM EDT RP Workstation: [id number]    Microbiology: Recent Results (from the past 240 hours)  Culture, blood (Routine x 2)     Status: Abnormal    Collection Time: 06/10/24  5:47 PM   Specimen: BLOOD LEFT ARM  Result Value Ref Range Status   Specimen Description   Final    BLOOD LEFT ARM Performed at [facility], [address] [place], [place] [address]    Special Requests   Final    BOTTLES DRAWN AEROBIC AND ANAEROBIC Blood Culture adequate volume Performed at [facility], [address] [place], [place] [address]    Culture  Setup Time   Final    GRAM NEGATIVE RODS IN BOTH AEROBIC AND ANAEROBIC BOTTLES CRITICAL VALUE NOTED.  VALUE IS CONSISTENT WITH PREVIOUSLY REPORTED AND CALLED VALUE.    Culture (A)  Final    KLEBSIELLA OXYTOCA SUSCEPTIBILITIES PERFORMED ON PREVIOUS CULTURE WITHIN THE LAST 5 DAYS. Performed at [facility], [address] [place], [place] [address]    Report Status 06/14/2024 FINAL  Final  Culture, blood (Routine x 2)     Status: Abnormal   Collection Time: 06/10/24  5:47 PM   Specimen: BLOOD RIGHT ARM  Result Value Ref Range Status   Specimen Description   Final    BLOOD RIGHT ARM Performed at [facility], [address] [place], [place] [address]    Special Requests   Final    BOTTLES DRAWN AEROBIC AND ANAEROBIC Blood Culture adequate volume Performed at [facility], [address] [place], [place] [address]    Culture  Setup Time   Final    GRAM NEGATIVE RODS IN BOTH AEROBIC AND ANAEROBIC BOTTLES CRITICAL RESULT CALLED TO, READ BACK BY AND VERIFIED WITH: PHARMD K WINDSOR [id number] AT 840 AM BY CM Performed at [facility], [address], [place], [place] [address]    Culture KLEBSIELLA OXYTOCA (A)  Final   Report Status 06/13/2024 FINAL  Final   Organism ID, Bacteria KLEBSIELLA OXYTOCA  Final      Susceptibility   Klebsiella oxytoca - MIC*    AMPICILLIN >=32 RESISTANT Resistant     CEFEPIME <=0.12 SENSITIVE Sensitive  ERTAPENEM <=0.12 SENSITIVE Sensitive     CEFTRIAXONE  <=0.25 SENSITIVE Sensitive     CIPROFLOXACIN  <=0.06  SENSITIVE Sensitive     GENTAMICIN <=1 SENSITIVE Sensitive     MEROPENEM <=0.25 SENSITIVE Sensitive     TRIMETH/SULFA <=20 SENSITIVE Sensitive     AMPICILLIN/SULBACTAM 8 SENSITIVE Sensitive     PIP/TAZO Value in next row Sensitive      <=4 SENSITIVEThis is a modified FDA-approved test that has been validated and its performance characteristics determined by the reporting laboratory.  This laboratory is certified under the Clinical Laboratory Improvement Amendments CLIA as qualified to perform high complexity clinical laboratory testing.    * KLEBSIELLA OXYTOCA  Blood Culture ID Panel (Reflexed)     Status: Abnormal   Collection Time: 06/10/24  5:47 PM  Result Value Ref Range Status   Enterococcus faecalis NOT DETECTED NOT DETECTED Final   Enterococcus Faecium NOT DETECTED NOT DETECTED Final   Listeria monocytogenes NOT DETECTED NOT DETECTED Final   Staphylococcus species NOT DETECTED NOT DETECTED Final   Staphylococcus aureus (BCID) NOT DETECTED NOT DETECTED Final   Staphylococcus epidermidis NOT DETECTED NOT DETECTED Final   Staphylococcus lugdunensis NOT DETECTED NOT DETECTED Final   Streptococcus species NOT DETECTED NOT DETECTED Final   Streptococcus agalactiae NOT DETECTED NOT DETECTED Final   Streptococcus pneumoniae NOT DETECTED NOT DETECTED Final   Streptococcus pyogenes NOT DETECTED NOT DETECTED Final   A.calcoaceticus-baumannii NOT DETECTED NOT DETECTED Final   Bacteroides fragilis NOT DETECTED NOT DETECTED Final   Enterobacterales DETECTED (A) NOT DETECTED Final    Comment: Enterobacterales represent a large order of gram negative bacteria, not a single organism. CRITICAL RESULT CALLED TO, READ BACK BY AND VERIFIED WITH: PHARMD K WINDSOR [id number] AT 840 AM BY CM    Enterobacter cloacae complex NOT DETECTED NOT DETECTED Final   Escherichia coli NOT DETECTED NOT DETECTED Final   Klebsiella aerogenes NOT DETECTED NOT DETECTED Final   Klebsiella oxytoca DETECTED (A) NOT DETECTED  Final    Comment: CRITICAL RESULT CALLED TO, READ BACK BY AND VERIFIED WITH: PHARMD K WINDSOR [id number] AT 840 AM BY CM    Klebsiella pneumoniae NOT DETECTED NOT DETECTED Final   Proteus species NOT DETECTED NOT DETECTED Final   Salmonella species NOT DETECTED NOT DETECTED Final   Serratia marcescens NOT DETECTED NOT DETECTED Final   Haemophilus influenzae NOT DETECTED NOT DETECTED Final   Neisseria meningitidis NOT DETECTED NOT DETECTED Final   Pseudomonas aeruginosa NOT DETECTED NOT DETECTED Final   Stenotrophomonas maltophilia NOT DETECTED NOT DETECTED Final   Candida albicans NOT DETECTED NOT DETECTED Final   Candida auris NOT DETECTED NOT DETECTED Final   Candida glabrata NOT DETECTED NOT DETECTED Final   Candida krusei NOT DETECTED NOT DETECTED Final   Candida parapsilosis NOT DETECTED NOT DETECTED Final   Candida tropicalis NOT DETECTED NOT DETECTED Final   Cryptococcus neoformans/gattii NOT DETECTED NOT DETECTED Final   CTX-M ESBL NOT DETECTED NOT DETECTED Final   Carbapenem resistance IMP NOT DETECTED NOT DETECTED Final   Carbapenem resistance KPC NOT DETECTED NOT DETECTED Final   Carbapenem resistance NDM NOT DETECTED NOT DETECTED Final   Carbapenem resist OXA 48 LIKE NOT DETECTED NOT DETECTED Final   Carbapenem resistance VIM NOT DETECTED NOT DETECTED Final    Comment: Performed at [facility], [address], [place], [place] [address]  Resp panel by RT-PCR (RSV, Flu A&B, Covid) Anterior Nasal Swab     Status: None   Collection Time:  06/10/24  6:21 PM   Specimen: Anterior Nasal Swab  Result Value Ref Range Status   SARS Coronavirus 2 by RT PCR NEGATIVE NEGATIVE Final    Comment: (NOTE) SARS-CoV-2 target nucleic acids are NOT DETECTED.  The SARS-CoV-2 RNA is generally detectable in upper respiratory specimens during the acute phase of infection. The lowest concentration of SARS-CoV-2 viral copies this assay can detect is 138 copies/mL. A negative result does not  preclude SARS-Cov-2 infection and should not be used as the sole basis for treatment or other patient management decisions. A negative result may occur with  improper specimen collection/handling, submission of specimen other than nasopharyngeal swab, presence of viral mutation(s) within the areas targeted by this assay, and inadequate number of viral copies(<138 copies/mL). A negative result must be combined with clinical observations, patient history, and epidemiological information. The expected result is Negative.  Fact Sheet for Patients:  BloggerCourse.com  Fact Sheet for Healthcare Providers:  SeriousBroker.it  This test is no t yet approved or cleared by the [place]  FDA and  has been authorized for detection and/or diagnosis of SARS-CoV-2 by FDA under an Emergency Use Authorization (EUA). This EUA will remain  in effect (meaning this test can be used) for the duration of the COVID-19 declaration under Section 564(b)(1) of the Act, 21 U.S.C.section 360bbb-3(b)(1), unless the authorization is terminated  or revoked sooner.       Influenza A by PCR NEGATIVE NEGATIVE Final   Influenza B by PCR NEGATIVE NEGATIVE Final    Comment: (NOTE) The Xpert Xpress SARS-CoV-2/FLU/RSV plus assay is intended as an aid in the diagnosis of influenza from Nasopharyngeal swab specimens and should not be used as a sole basis for treatment. Nasal washings and aspirates are unacceptable for Xpert Xpress SARS-CoV-2/FLU/RSV testing.  Fact Sheet for Patients: BloggerCourse.com  Fact Sheet for Healthcare Providers: SeriousBroker.it  This test is not yet approved or cleared by the [place]  FDA and has been authorized for detection and/or diagnosis of SARS-CoV-2 by FDA under an Emergency Use Authorization (EUA). This EUA will remain in effect (meaning this test can be used) for the  duration of the COVID-19 declaration under Section 564(b)(1) of the Act, 21 U.S.C. section 360bbb-3(b)(1), unless the authorization is terminated or revoked.     Resp Syncytial Virus by PCR NEGATIVE NEGATIVE Final    Comment: (NOTE) Fact Sheet for Patients: BloggerCourse.com  Fact Sheet for Healthcare Providers: SeriousBroker.it  This test is not yet approved or cleared by the [place]  FDA and has been authorized for detection and/or diagnosis of SARS-CoV-2 by FDA under an Emergency Use Authorization (EUA). This EUA will remain in effect (meaning this test can be used) for the duration of the COVID-19 declaration under Section 564(b)(1) of the Act, 21 U.S.C. section 360bbb-3(b)(1), unless the authorization is terminated or revoked.  Performed at [facility], [address] [place], [place] [address]   Urine Culture     Status: None   Collection Time: 06/10/24  8:31 PM   Specimen: Urine, Catheterized  Result Value Ref Range Status   Specimen Description   Final    URINE, CATHETERIZED Performed at [facility], [address] [place], [place] [address]    Special Requests   Final    NONE Performed at [facility], [address] [place], [place] [address]    Culture   Final    NO GROWTH Performed at [facility], [address], [place], [place] [address]    Report Status 06/11/2024 FINAL  Final  MRSA Next Gen by PCR, Nasal     Status: None   Collection Time: 06/11/24 12:11 AM   Specimen: Nasal Mucosa; Nasal Swab  Result Value Ref Range Status   MRSA by PCR Next Gen NOT DETECTED NOT DETECTED Final    Comment: (NOTE) The GeneXpert MRSA Assay (FDA approved for NASAL specimens only), is one component of a comprehensive MRSA colonization surveillance program. It is not intended to diagnose MRSA infection nor to guide or monitor treatment for MRSA infections. Test performance  is not FDA approved in patients less than 39 years old. Performed at [facility], [address] [place], [place] [address]   Urine Culture     Status: Abnormal   Collection Time: 06/11/24  1:48 AM   Specimen: Urine, Cystoscope  Result Value Ref Range Status   Specimen Description   Final    URINE, RANDOM Performed at [facility], [address] [place], [place] [address]    Special Requests   Final    Urine cystoscope Performed at [facility], [address] [place], [place] [address]    Culture >=100,000 COLONIES/mL KLEBSIELLA OXYTOCA (A)  Final   Report Status 06/13/2024 FINAL  Final   Organism ID, Bacteria KLEBSIELLA OXYTOCA (A)  Final      Susceptibility   Klebsiella oxytoca - MIC*    AMPICILLIN >=32 RESISTANT Resistant     CEFEPIME <=0.12 SENSITIVE Sensitive     ERTAPENEM <=0.12 SENSITIVE Sensitive     CEFTRIAXONE  <=0.25 SENSITIVE Sensitive     CIPROFLOXACIN  <=0.06 SENSITIVE Sensitive     GENTAMICIN <=1 SENSITIVE Sensitive     NITROFURANTOIN 32 SENSITIVE Sensitive     TRIMETH/SULFA <=20 SENSITIVE Sensitive     AMPICILLIN/SULBACTAM 8 SENSITIVE Sensitive     PIP/TAZO Value in next row Sensitive      <=4 SENSITIVEThis is a modified FDA-approved test that has been validated and its performance characteristics determined by the reporting laboratory.  This laboratory is certified under the Clinical Laboratory Improvement Amendments CLIA as qualified to perform high complexity clinical laboratory testing.    MEROPENEM Value in next row Sensitive      <=4 SENSITIVEThis is a modified FDA-approved test that has been validated and its performance characteristics determined by the reporting laboratory.  This laboratory is certified under the Clinical Laboratory Improvement Amendments CLIA as qualified to perform high complexity clinical laboratory testing.    * >=100,000 COLONIES/mL KLEBSIELLA OXYTOCA  Culture, blood (Routine  X 2) w Reflex to ID Panel     Status: None (Preliminary result)   Collection Time: 06/13/24  9:12 AM   Specimen: BLOOD LEFT ARM  Result Value Ref Range Status   Specimen Description   Final    BLOOD LEFT ARM BOTTLES DRAWN AEROBIC AND ANAEROBIC Performed at [facility], [address] [place], [place] [address]    Special Requests   Final    Blood Culture results may not be optimal due to an inadequate volume of blood received in culture bottles Performed at [facility], [address], [place], [place] [address]    Culture   Final    NO GROWTH 4 DAYS Performed at [facility], [address] [place], [place] [address]    Report Status PENDING  Incomplete  Culture, blood (Routine X 2) w Reflex to ID Panel  Status: None (Preliminary result)   Collection Time: 06/13/24  9:12 AM   Specimen: BLOOD RIGHT ARM  Result Value Ref Range Status   Specimen Description   Final    BLOOD RIGHT ARM BOTTLES DRAWN AEROBIC ONLY Performed at [facility], [address] [place], [place] [address]    Special Requests   Final    Blood Culture results may not be optimal due to an inadequate volume of blood received in culture bottles Performed at [facility], [address], [place], [place] [address]    Culture   Final    NO GROWTH 4 DAYS Performed at [facility], [address] [place], [place] [address]    Report Status PENDING  Incomplete  Respiratory (~20 pathogens) panel by PCR     Status: None   Collection Time: 06/14/24  4:24 PM   Specimen: Nasopharyngeal Swab; Respiratory  Result Value Ref Range Status   Adenovirus NOT DETECTED NOT DETECTED Final   Coronavirus 229E NOT DETECTED NOT DETECTED Final    Comment: (NOTE) The Coronavirus on the Respiratory Panel, DOES NOT test for the novel  Coronavirus (2019 nCoV)    Coronavirus HKU1 NOT DETECTED NOT DETECTED Final   Coronavirus NL63 NOT  DETECTED NOT DETECTED Final   Coronavirus OC43 NOT DETECTED NOT DETECTED Final   Metapneumovirus NOT DETECTED NOT DETECTED Final   Rhinovirus / Enterovirus NOT DETECTED NOT DETECTED Final   Influenza A NOT DETECTED NOT DETECTED Final   Influenza B NOT DETECTED NOT DETECTED Final   Parainfluenza Virus 1 NOT DETECTED NOT DETECTED Final   Parainfluenza Virus 2 NOT DETECTED NOT DETECTED Final   Parainfluenza Virus 3 NOT DETECTED NOT DETECTED Final   Parainfluenza Virus 4 NOT DETECTED NOT DETECTED Final   Respiratory Syncytial Virus NOT DETECTED NOT DETECTED Final   Bordetella pertussis NOT DETECTED NOT DETECTED Final   Bordetella Parapertussis NOT DETECTED NOT DETECTED Final   Chlamydophila pneumoniae NOT DETECTED NOT DETECTED Final   Mycoplasma pneumoniae NOT DETECTED NOT DETECTED Final    Comment: Performed at [facility], [address], [place], [place] [address]    Time spent: 0 minutes  Signed: Mennie LAMY, MD Jun 20, 2024

## 2024-06-27 NOTE — Plan of Care (Signed)
 Anna [name] has transitioned to comfort measures. Stopped in check her and her family. Urology is available for any help we may offer during this difficult time. Will sign off, but please call with any questions or concerns.

## 2024-06-27 NOTE — Progress Notes (Signed)
 PROGRESS NOTE Anna [name]  FMW:[id number] DOB: 06-22-1934 DOA: 06/10/2024 PCP: Thedora Garnette HERO, MD  Brief Narrative/Hospital Course: Anna [name] is a 89 y.o. female with PMH of obstructive kidney stones, asthma, history of breast cancer, seasonal allergies, chronic kidney disease stage III, osteoarthritis, presents with right back pain, dysuria and generalized fatigue nausea without gross hematuria. In the ED found to have severe sepsis with hypotension, tachycardia lactic acidosis. Patient was resuscitated with IV fluids 2 L, antibiotics. Labs- troponin 29 >158,  la 4.8>4>6, UA leukocyte esterase WBC 6-10 CT chest and pelvis with contrast >>6 mm distal right ureteral calculus,severe right hydroureteronephrosis. 2. 5 mm nonobstructing right lower pole renal calculus.3. No acute findings in the chest EDP discussed with cardiology reviewed EKG felt demand ischemia Urology consulted and transferred to [facility] urgently for ureteral stent placement. S/p cystoscopy right ureteral stent Postprocedure hypotensive lethargic worsening lactic acidosis-received  albumin 25 g, midodrine x 2 and 1 l bolus IVF PCCM was consulted placed on HHFNC.  Patient having acute metabolic encephalopathy in the setting of sepsis Respiratory status has been tenuous, patient has been placed on BiPAP 10/19 palliative care consulted for goals of care Patient was transition to comfort measures for end-of-life care with worsening respiratory status and ongoing altered mental status  Subjective: Seen and examined Multiple family at the bedside patient somewhat tachypneic, on high flow oxygen and initiated on morphine drip resting and appears to be getting more comfortable now  Assessment and plan:  End-of-life care: Morphine drip and other comforting medication has been initiated appreciate palliative care input.  Expecting hospital death  Severe sepsis/septic shock with lactate more than 4 due to obstructive  right  ureteral calculus 6 mm w/ severe right hydronephrosis Blood cultures 4/4 Klebsiella oxytoca Klebsiella oxytoca UTI: S/p cystoscopy with right ureteral stent,aggressive IVF, s/p albumin Lactic acidosis trended down,leukocytosis improving, and BP remains stable-having tenuous respiratory status  Blood culture 4/4 Klebsiella oxytoca, urine culture similar bacteria-sensitive to Rocephin > changed to Zosyn 10/19 due to respiratory status PCCM and urology following.   Acute respiratory failure with hypoxia Pneumonia left base-concerning for aspiration pneumonia: In the setting of severe sepsis> with respiratory failure likely multifactorial, also concern for fluid overload and pneumonia Patient receiving BiPAP along with supplemental oxygen and Lasix Now on EOL care  Concern for fluid overload Acute diastolic CHF : Echo this admission with normal EF, G1 DD, proBNP elevated 10,288, s/p IV Lasix  Elevated troponin from demand ischemia due to sepsis: echo reassuring with no WMA , treat underlying etiology.  Cardio input appreciated  Acute metabolic encephalopathy Deconditioning debility At risk for malnutrition: Patient has been confused in the setting of sepsis hypoxia, remains confused and in distress,    AKI: resolved.    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  Patient and family at this time has decided to transition to comfort measures  DVT prophylaxis:  Code Status:   Code Status: Do not attempt resuscitation (DNR) - Comfort  care Family Communication: plan of care discussed w/ family Patient status is: Remains hospitalized because of severity of illness Level of care: Stepdown  as she is critically ill  Dispo: The patient is from: HOME with her son            Anticipated disposition: hospice Objective: Vitals last 24 hrs: Vitals:   Jun 28, 2024 0250 June 28, 2024 0300 06/28/2024 0304 Jun 28, 2024 0400  BP:      Pulse: 90 100 97 (!) 104  Resp: (!) 28 (!) 31 (!) 31 (!) 30  Temp:      TempSrc:      SpO2: 94% 95% 91% 91%  Weight:      Height:       Physical Examination: General exam: Not responsive HEENT:Oral mucosa moist, Ear/Nose WNL grossly Respiratory system: Bilaterally diminished  Cardiovascular system: S1 & S2 +, No JVD. Gastrointestinal system: Abdomen soft,NT,ND, BS+ Nervous System: Sleeping Extremities: extremities warm, leg edema mild Skin: Warm, no rashes MSK: Normal muscle bulk,tone, power   Medications reviewed:  Scheduled Meds:  artificial tears  1 drop Both Eyes BID   azelastine   1 spray Each Nare BID   ipratropium-albuterol  3 mL Nebulization Q4H   latanoprost   1 drop Both Eyes QPM   LORazepam  1 mg Intravenous Q6H   Continuous Infusions:  morphine 3 mg/hr (Jun 28, 2024 9047)   Diet: Diet Order             Diet regular Fluid consistency: Thin  Diet effective now                    Data Reviewed: I have personally reviewed following labs and imaging studies ( see epic result tab) CBC: Recent Labs  Lab 06/10/24 1743 06/11/24 0314 06/12/24 0309 06/13/24 0250 06/14/24 0255 06/15/24 0500  WBC 3.5* 14.3* 34.0* 35.8* 34.1* 18.8*  NEUTROABS 3.2  --   --  33.4*  --   --   HGB 13.9 10.6* 12.0 11.5* 10.7* 10.3*  HCT 43.1 35.6* 38.3 36.3 32.3* 31.8*  MCV 91.9 97.5 93.0 92.6 90.5 89.6  PLT 180 72* 76* 78*  91* 100*   CMP: Recent Labs  Lab 06/11/24 1821 06/12/24 0309 06/13/24 0250 06/14/24 0255 06/15/24 0500  NA 141 142 144 144 145  K 4.1 4.4 3.7 3.1* 3.3*  CL 110 110 111 111 110  CO2 18* 20* 24 25 28   GLUCOSE 101* 102* 99 148* 132*  BUN 20 23 33* 27* 23  CREATININE 0.91 1.00 0.78 0.51 0.51  CALCIUM 8.4* 8.7* 9.0 8.9 8.6*   GFR: Estimated Creatinine Clearance: 38.6 mL/min (by C-G formula based on SCr of 0.51 mg/dL). Recent Labs  Lab 06/10/24 1743 06/11/24 0314  AST 31 61*  ALT 18 26  ALKPHOS 110 109  BILITOT 0.8 0.6  PROT 6.5 4.3*  ALBUMIN 3.9 2.6*   No results for input(s): LIPASE, AMYLASE in the last 168 hours. No results for input(s): AMMONIA in the last 168 hours. Coagulation Profile:  Recent Labs  Lab 06/10/24 1743 06/11/24 0314  INR 1.0 1.6*   Unresulted Labs (From admission, onward)    None      Antimicrobials/Microbiology: Anti-infectives (From admission, onward)    Start     Dose/Rate Route Frequency Ordered Stop   06/14/24 1100  doxycycline  (VIBRAMYCIN ) 100 mg in sodium chloride  0.9 % 250 mL IVPB  Status:  Discontinued        100 mg 125 mL/hr over 120 Minutes Intravenous 2 times daily  06/14/24 1012 2024-07-16 0739   06/14/24 1000  piperacillin-tazobactam (ZOSYN) IVPB 3.375 g  Status:  Discontinued        3.375 g 12.5 mL/hr over 240 Minutes Intravenous Every 8 hours 06/14/24 0819 07-16-2024 0739   06/11/24 1000  cefTRIAXone  (ROCEPHIN ) 2 g in sodium chloride  0.9 % 100 mL IVPB  Status:  Discontinued        2 g 200 mL/hr over 30 Minutes Intravenous Every 24 hours 06/11/24 0851 06/14/24 0819   06/11/24 0200  piperacillin-tazobactam (ZOSYN) IVPB 3.375 g  Status:  Discontinued        3.375 g 12.5 mL/hr over 240 Minutes Intravenous Every 8 hours 06/10/24 2129 06/11/24 0851   06/11/24 0130  ceFEPIme (MAXIPIME) 2 g in sodium chloride  0.9 % 100 mL IVPB  Status:  Discontinued        2 g 200 mL/hr over 30 Minutes Intravenous  Once 06/11/24 0033 06/11/24 0039    06/10/24 1915  azithromycin  (ZITHROMAX ) 500 mg in sodium chloride  0.9 % 250 mL IVPB        500 mg 250 mL/hr over 60 Minutes Intravenous  Once 06/10/24 1907 06/10/24 2043   06/10/24 1745  piperacillin-tazobactam (ZOSYN) IVPB 3.375 g        3.375 g 100 mL/hr over 30 Minutes Intravenous  Once 06/10/24 1738 06/10/24 1845         Component Value Date/Time   SDES  06/13/2024 0912    BLOOD LEFT ARM BOTTLES DRAWN AEROBIC AND ANAEROBIC Performed at [facility], [address] [place], [place] [address]    SDES  06/13/2024 [phone]    BLOOD RIGHT ARM BOTTLES DRAWN AEROBIC ONLY Performed at [facility], [address] [place], [place] [address]    SPECREQUEST  06/13/2024 0912    Blood Culture results may not be optimal due to an inadequate volume of blood received in culture bottles Performed at [facility], [address], [place], [place] [address]    SPECREQUEST  06/13/2024 0912    Blood Culture results may not be optimal due to an inadequate volume of blood received in culture bottles Performed at [facility], [address] [place], [place] [address]    CULT  06/13/2024 0912    NO GROWTH 3 DAYS Performed at [facility], [address], [place], [place] [address]    CULT  06/13/2024 0912    NO GROWTH 3 DAYS Performed at [facility], [address], [place], [place] [address]    REPTSTATUS PENDING 06/13/2024 0912   REPTSTATUS PENDING 06/13/2024 0912    Procedures: Procedure(s) (LRB): CYSTOURETEROSCOPY, WITH RETROGRADE PYELOGRAM AND STENT INSERTION (Right)   Mennie LAMY, MD [facility] 07/16/24, 10:54 AM

## 2024-06-27 DEATH — deceased

## 2024-07-10 ENCOUNTER — Ambulatory Visit
